# Patient Record
Sex: Male | Born: 1960 | Race: Black or African American | Hispanic: No | Marital: Single | Smoking: Current every day smoker
Health system: Southern US, Community
[De-identification: ages and names within clinical notes are randomized; demographics above are authoritative.]

## PROBLEM LIST (undated history)

## (undated) DIAGNOSIS — M453 Ankylosing spondylitis of cervicothoracic region: Secondary | ICD-10-CM

## (undated) DIAGNOSIS — E119 Type 2 diabetes mellitus without complications: Secondary | ICD-10-CM

## (undated) DIAGNOSIS — M2550 Pain in unspecified joint: Secondary | ICD-10-CM

## (undated) DIAGNOSIS — E43 Unspecified severe protein-calorie malnutrition: Secondary | ICD-10-CM

## (undated) DIAGNOSIS — K8020 Calculus of gallbladder without cholecystitis without obstruction: Secondary | ICD-10-CM

## (undated) DIAGNOSIS — M4802 Spinal stenosis, cervical region: Secondary | ICD-10-CM

## (undated) DIAGNOSIS — M109 Gout, unspecified: Secondary | ICD-10-CM

## (undated) DIAGNOSIS — R634 Abnormal weight loss: Secondary | ICD-10-CM

## (undated) DIAGNOSIS — M1990 Unspecified osteoarthritis, unspecified site: Secondary | ICD-10-CM

## (undated) DIAGNOSIS — J189 Pneumonia, unspecified organism: Secondary | ICD-10-CM

## (undated) DIAGNOSIS — E1110 Type 2 diabetes mellitus with ketoacidosis without coma: Secondary | ICD-10-CM

## (undated) DIAGNOSIS — K4090 Unilateral inguinal hernia, without obstruction or gangrene, not specified as recurrent: Secondary | ICD-10-CM

## (undated) HISTORY — PX: NO PAST SURGERIES: SHX2092

---

## 2003-05-24 ENCOUNTER — Emergency Department (HOSPITAL_COMMUNITY): Admission: EM | Admit: 2003-05-24 | Discharge: 2003-05-24 | Payer: Self-pay | Admitting: Emergency Medicine

## 2003-05-27 ENCOUNTER — Emergency Department (HOSPITAL_COMMUNITY): Admission: EM | Admit: 2003-05-27 | Discharge: 2003-05-27 | Payer: Self-pay | Admitting: Emergency Medicine

## 2006-01-24 ENCOUNTER — Emergency Department (HOSPITAL_COMMUNITY): Admission: EM | Admit: 2006-01-24 | Discharge: 2006-01-24 | Payer: Self-pay | Admitting: Emergency Medicine

## 2010-12-16 ENCOUNTER — Emergency Department (HOSPITAL_COMMUNITY): Payer: No Typology Code available for payment source

## 2010-12-16 ENCOUNTER — Emergency Department (HOSPITAL_COMMUNITY)
Admission: EM | Admit: 2010-12-16 | Discharge: 2010-12-17 | Disposition: A | Discharge status: Home / Self Care | Payer: No Typology Code available for payment source | Attending: Emergency Medicine | Admitting: Emergency Medicine

## 2010-12-16 ENCOUNTER — Encounter: Payer: Self-pay | Admitting: *Deleted

## 2010-12-16 DIAGNOSIS — M25532 Pain in left wrist: Secondary | ICD-10-CM

## 2010-12-16 DIAGNOSIS — Y92410 Unspecified street and highway as the place of occurrence of the external cause: Secondary | ICD-10-CM | POA: Insufficient documentation

## 2010-12-16 DIAGNOSIS — S20219A Contusion of unspecified front wall of thorax, initial encounter: Secondary | ICD-10-CM | POA: Insufficient documentation

## 2010-12-16 DIAGNOSIS — M25539 Pain in unspecified wrist: Secondary | ICD-10-CM | POA: Insufficient documentation

## 2010-12-16 DIAGNOSIS — F17200 Nicotine dependence, unspecified, uncomplicated: Secondary | ICD-10-CM | POA: Insufficient documentation
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
## 2010-12-17 MED ORDER — HYDROCODONE-ACETAMINOPHEN 5-325 MG PO TABS: ORAL_TABLET | ORAL | Status: AC
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
## 2010-12-22 ENCOUNTER — Encounter: Payer: Self-pay | Admitting: Orthopedic Surgery

## 2010-12-22 ENCOUNTER — Ambulatory Visit (INDEPENDENT_AMBULATORY_CARE_PROVIDER_SITE_OTHER): Payer: BC Managed Care – PPO | Admitting: Orthopedic Surgery

## 2010-12-22 VITALS — Resp 16 | Ht 63.0 in | Wt 334.0 lb

## 2010-12-22 DIAGNOSIS — S52609A Unspecified fracture of lower end of unspecified ulna, initial encounter for closed fracture: Secondary | ICD-10-CM

## 2010-12-22 NOTE — Patient Instructions (Signed)
OOW  8-15  Thru 9-18  Wear brace except for bathing
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
## 2011-01-19 ENCOUNTER — Ambulatory Visit: Payer: BC Managed Care – PPO | Admitting: Orthopedic Surgery

## 2011-02-03 ENCOUNTER — Ambulatory Visit (INDEPENDENT_AMBULATORY_CARE_PROVIDER_SITE_OTHER): Payer: BC Managed Care – PPO | Admitting: Orthopedic Surgery

## 2011-02-03 ENCOUNTER — Encounter: Payer: Self-pay | Admitting: Orthopedic Surgery

## 2011-02-03 DIAGNOSIS — S52609A Unspecified fracture of lower end of unspecified ulna, initial encounter for closed fracture: Secondary | ICD-10-CM
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
## 2011-02-03 NOTE — Patient Instructions (Signed)
Released to normal activity:)

## 2011-09-22 ENCOUNTER — Encounter (HOSPITAL_COMMUNITY): Payer: Self-pay | Admitting: *Deleted

## 2011-09-22 ENCOUNTER — Emergency Department (HOSPITAL_COMMUNITY)
Admission: EM | Admit: 2011-09-22 | Discharge: 2011-09-22 | Disposition: A | Discharge status: Home / Self Care | Payer: BC Managed Care – PPO | Attending: Emergency Medicine | Admitting: Emergency Medicine

## 2011-09-22 DIAGNOSIS — IMO0002 Reserved for concepts with insufficient information to code with codable children: Secondary | ICD-10-CM | POA: Insufficient documentation

## 2011-09-22 DIAGNOSIS — X58XXXA Exposure to other specified factors, initial encounter: Secondary | ICD-10-CM | POA: Insufficient documentation

## 2011-09-22 DIAGNOSIS — T148XXA Other injury of unspecified body region, initial encounter: Secondary | ICD-10-CM

## 2011-09-22 DIAGNOSIS — R109 Unspecified abdominal pain: Secondary | ICD-10-CM | POA: Insufficient documentation

## 2011-09-22 MED ORDER — CYCLOBENZAPRINE HCL 10 MG PO TABS: ORAL_TABLET | ORAL | Status: DC

## 2011-09-22 MED ORDER — HYDROCODONE-ACETAMINOPHEN 5-325 MG PO TABS: ORAL_TABLET | ORAL | Status: DC

## 2011-09-22 NOTE — ED Notes (Signed)
Pt c/o right upper thigh/groin pain that started Monday, worse with movement,  Unsure of any injury

## 2011-09-22 NOTE — ED Provider Notes (Signed)
History     CSN: [id number]  Arrival date & time 09/22/11  1433   First MD Initiated Contact with Patient 09/22/11 1449      Chief Complaint  Patient presents with  . Groin Pain    (Consider location/radiation/quality/duration/timing/severity/associated sxs/prior treatment) Patient is a 51 y.o. male presenting with groin pain. The history is provided by the patient. No language interpreter was used.  Groin Pain This is a new problem. Episode onset: pt was sitting down at work 2 days ago.  when he stood up he had a "sharp" pain in the R groin area.  the only time it hurts is when he flexes his R leg..  no abdominal pain.  appetite normal.  no back pain or testicular pain.  no fever or chills. The problem has been unchanged. Pertinent negatives include no change in bowel habit, chest pain, diaphoresis, nausea or vomiting. The symptoms are aggravated by walking and standing. He has tried NSAIDs (took 2 ibuprofen yest with no relief.) for the symptoms.    History reviewed. No pertinent past medical history.  Past Surgical History  Procedure Date  . No past surgeries     Family History  Problem Relation Age of Onset  . Arthritis    . Diabetes      History  Substance Use Topics  . Smoking status: Current Everyday Smoker -- 1.0 packs/day  . Smokeless tobacco: Not on file  . Alcohol Use: 0.6 oz/week    1 Cans of beer per week     occassional drinker      Review of Systems  Constitutional: Negative for diaphoresis.  Cardiovascular: Negative for chest pain.  Gastrointestinal: Negative for nausea, vomiting, diarrhea and change in bowel habit.  Genitourinary: Negative for dysuria, urgency, frequency, hematuria, decreased urine volume, discharge, scrotal swelling, genital sores and testicular pain.  All other systems reviewed and are negative.    Allergies  Bee venom and Shellfish allergy  Home Medications   Current Outpatient Rx  Name Route Sig Dispense Refill  .  DIPHENHYDRAMINE HCL 25 MG PO TABS Oral Take 25-50 mg by mouth 2 (two) times daily. For itching and allergy symptoms     . IBUPROFEN 200 MG PO TABS Oral Take 400 mg by mouth as needed. For pain    . NASAL SPRAY NA Nasal Place 2-3 sprays into the nose every 8 (eight) hours as needed. For allergy symptoms     . CYCLOBENZAPRINE HCL 10 MG PO TABS  1/2 to one tab po TID 20 tablet 0  . HYDROCODONE-ACETAMINOPHEN 5-325 MG PO TABS  One tab po q 4-6 hrs prn pain 20 tablet 0    BP 137/70  Pulse 98  Temp 98.2 F (36.8 C)  Resp 18  Ht 5\' 6"  (1.676 m)  Wt 320 lb (145.151 kg)  BMI 51.65 kg/m2  SpO2 95%  Physical Exam  Nursing note and vitals reviewed. Constitutional: He is oriented to person, place, and time. He appears well-developed and well-nourished.  Non-toxic appearance. He does not have a sickly appearance. He does not appear ill. No distress.       Morbidly obese.  HENT:  Head: Normocephalic and atraumatic.  Eyes: EOM are normal.  Neck: Normal range of motion.  Cardiovascular: Normal rate, regular rhythm, normal heart sounds and intact distal pulses.   Pulmonary/Chest: Effort normal and breath sounds normal. No respiratory distress.  Abdominal: Soft. He exhibits no distension. There is no tenderness.  Musculoskeletal: Normal range of motion.  Legs: Neurological: He is alert and oriented to person, place, and time.  Skin: Skin is warm and dry.  Psychiatric: He has a normal mood and affect. Judgment normal.    ED Course  Procedures (including critical care time)  Labs Reviewed - No data to display No results found.   1. Muscle strain       MDM  Exam c/w muscle strain/tear.  Pt agrees with muscle relaxant, NSAID and watchful waiting.        Worthy Rancher, PA 09/22/11 [phone]

## 2011-09-22 NOTE — Discharge Instructions (Signed)
Muscle Strain A muscle strain (pulled muscle) happens when a muscle is over-stretched. Recovery usually takes 5 to 6 weeks.  HOME CARE   Put ice on the injured area.   Put ice in a plastic bag.   Place a towel between your skin and the bag.   Leave the ice on for 15 to 20 minutes at a time, every hour for the first 2 days.   Do not use the muscle for several days or until your doctor says you can. Do not use the muscle if you have pain.   Wrap the injured area with an elastic bandage for comfort. Do not put it on too tightly.   Only take medicine as told by your doctor.   Warm up before exercise. This helps prevent muscle strains.  GET HELP RIGHT AWAY IF:  There is increased pain or puffiness (swelling) in the affected area. MAKE SURE YOU:   Understand these instructions.   Will watch your condition.   Will get help right away if you are not doing well or get worse.  Document Released: 01/27/2008 Document Revised: 04/08/2011 Document Reviewed: 01/27/2008 [facility] Patient Information 2012 [place], [place].   Take the meds as directed.  Take ibuprofen 800 mg every 8 hrs with food.  Return if your symptoms change significantly.

## 2011-09-23 NOTE — ED Provider Notes (Signed)
Medical screening examination/treatment/procedure(s) were performed by non-physician practitioner and as supervising physician I was immediately available for consultation/collaboration.  Donnetta Hutching, MD 09/23/11 [phone]

## 2011-11-30 ENCOUNTER — Encounter (HOSPITAL_COMMUNITY): Payer: Self-pay | Admitting: Emergency Medicine

## 2011-11-30 ENCOUNTER — Emergency Department (HOSPITAL_COMMUNITY): Payer: BC Managed Care – PPO

## 2011-11-30 ENCOUNTER — Emergency Department (HOSPITAL_COMMUNITY)
Admission: EM | Admit: 2011-11-30 | Discharge: 2011-11-30 | Disposition: A | Discharge status: Home / Self Care | Payer: BC Managed Care – PPO | Attending: Emergency Medicine | Admitting: Emergency Medicine

## 2011-11-30 DIAGNOSIS — R0602 Shortness of breath: Secondary | ICD-10-CM | POA: Insufficient documentation

## 2011-11-30 DIAGNOSIS — J40 Bronchitis, not specified as acute or chronic: Secondary | ICD-10-CM | POA: Insufficient documentation

## 2011-11-30 DIAGNOSIS — F17200 Nicotine dependence, unspecified, uncomplicated: Secondary | ICD-10-CM | POA: Insufficient documentation

## 2011-11-30 DIAGNOSIS — J9801 Acute bronchospasm: Secondary | ICD-10-CM

## 2011-11-30 LAB — COMPREHENSIVE METABOLIC PANEL
Alkaline Phosphatase: 117 U/L (ref 39–117)
BUN: 8 mg/dL (ref 6–23)
Creatinine, Ser: 0.69 mg/dL (ref 0.50–1.35)
GFR calc Af Amer: >90 mL/min (ref >90)
Glucose, Bld: 147 mg/dL — ABNORMAL HIGH (ref 70–99)
Potassium: 4.1 mEq/L (ref 3.5–5.1)
Total Bilirubin: 0.3 mg/dL (ref 0.3–1.2)
Total Protein: 7.8 g/dL (ref 6.0–8.3)

## 2011-11-30 LAB — CBC WITH DIFFERENTIAL/PLATELET
Eosinophils Absolute: 0.2 10*3/uL (ref 0.0–0.7)
HCT: 46.5 % (ref 39.0–52.0)
Hemoglobin: 14.8 g/dL (ref 13.0–17.0)
Lymphs Abs: 1.2 10*3/uL (ref 0.7–4.0)
MCH: 28.1 pg (ref 26.0–34.0)
MCHC: 31.8 g/dL (ref 30.0–36.0)
MCV: 88.2 fL (ref 78.0–100.0)
Monocytes Absolute: 0.6 10*3/uL (ref 0.1–1.0)
Monocytes Relative: 6 % (ref 3–12)
Neutrophils Relative %: 81 % — ABNORMAL HIGH (ref 43–77)
RBC: 5.27 MIL/uL (ref 4.22–5.81)

## 2011-11-30 MED ORDER — IPRATROPIUM BROMIDE 0.02 % IN SOLN
0.5000 mg | Freq: Once | RESPIRATORY_TRACT | Status: AC
  Administered 2011-11-30: 0.5 mg via RESPIRATORY_TRACT
  Filled 2011-11-30: qty 2.5

## 2011-11-30 MED ORDER — ALBUTEROL SULFATE HFA 108 (90 BASE) MCG/ACT IN AERS: 1.0000 | INHALATION_SPRAY | Freq: Four times a day (QID) | RESPIRATORY_TRACT | Status: DC | PRN

## 2011-11-30 MED ORDER — PREDNISONE 10 MG PO TABS: 20.0000 mg | ORAL_TABLET | Freq: Every day | ORAL | Status: DC

## 2011-11-30 MED ORDER — METHYLPREDNISOLONE SODIUM SUCC 125 MG IJ SOLR
125.0000 mg | Freq: Once | INTRAMUSCULAR | Status: AC
  Administered 2011-11-30: 125 mg via INTRAVENOUS
  Filled 2011-11-30: qty 2

## 2011-11-30 MED ORDER — AMOXICILLIN 500 MG PO CAPS: 500.0000 mg | ORAL_CAPSULE | Freq: Three times a day (TID) | ORAL | Status: AC

## 2011-11-30 MED ORDER — ALBUTEROL SULFATE (5 MG/ML) 0.5% IN NEBU
2.5000 mg | INHALATION_SOLUTION | Freq: Once | RESPIRATORY_TRACT | Status: AC
  Administered 2011-11-30: 2.5 mg via RESPIRATORY_TRACT
  Filled 2011-11-30: qty 0.5

## 2011-11-30 NOTE — ED Notes (Signed)
Patient states he feels he is breathing better now.  Sats at 97%

## 2011-11-30 NOTE — ED Notes (Signed)
Placed pt. On 3L [place] O2 for SpO2 of 88%.  SpO2 rebounded to 94% afterward.

## 2011-11-30 NOTE — ED Provider Notes (Signed)
History     CSN: [id number]  Arrival date & time 11/30/11  2006   First MD Initiated Contact with Patient 11/30/11 2018      Chief Complaint  Patient presents with  . Cough  . Nasal Congestion  . Shortness of Breath    (Consider location/radiation/quality/duration/timing/severity/associated sxs/prior treatment) Patient is a 51 y.o. male presenting with cough and shortness of breath. The history is provided by the patient (the pt complains of a cough and sob). No language interpreter was used.  Cough This is a new problem. The current episode started yesterday. The problem occurs constantly. The problem has not changed since onset.The cough is productive of sputum. There has been no fever. Associated symptoms include shortness of breath. Pertinent negatives include no chest pain and no headaches. He has tried nothing for the symptoms. The treatment provided no relief. He is not a smoker. His past medical history does not include bronchitis or pneumonia.  Shortness of Breath  Associated symptoms include cough and shortness of breath. Pertinent negatives include no chest pain.    History reviewed. No pertinent past medical history.  Past Surgical History  Procedure Date  . No past surgeries     Family History  Problem Relation Age of Onset  . Arthritis    . Diabetes      History  Substance Use Topics  . Smoking status: Current Everyday Smoker -- 1.0 packs/day  . Smokeless tobacco: Not on file  . Alcohol Use: 0.6 oz/week    1 Cans of beer per week     occassional drinker      Review of Systems  Constitutional: Negative for fatigue.  HENT: Negative for congestion, sinus pressure and ear discharge.   Eyes: Negative for discharge.  Respiratory: Positive for cough and shortness of breath.   Cardiovascular: Negative for chest pain.  Gastrointestinal: Negative for abdominal pain and diarrhea.  Genitourinary: Negative for frequency and hematuria.  Musculoskeletal:  Negative for back pain.  Skin: Negative for rash.  Neurological: Negative for seizures and headaches.  Hematological: Negative.   Psychiatric/Behavioral: Negative for hallucinations.    Allergies  Bee venom and Shellfish allergy  Home Medications   Current Outpatient Rx  Name Route Sig Dispense Refill  . DIPHENHYDRAMINE HCL 25 MG PO TABS Oral Take 25-50 mg by mouth daily as needed. For itching and allergy symptoms    . IBUPROFEN 200 MG PO TABS Oral Take 400 mg by mouth as needed. For pain    . NASAL SPRAY NA Nasal Place 2-3 sprays into the nose every 8 (eight) hours as needed. For allergy symptoms     . ALBUTEROL SULFATE HFA 108 (90 BASE) MCG/ACT IN AERS Inhalation Inhale 1-2 puffs into the lungs every 6 (six) hours as needed for wheezing. 1 Inhaler 0  . AMOXICILLIN 500 MG PO CAPS Oral Take 1 capsule (500 mg total) by mouth 3 (three) times daily. 21 capsule 0  . PREDNISONE 10 MG PO TABS Oral Take 2 tablets (20 mg total) by mouth daily. 15 tablet 0    BP 177/84  Pulse 104  Temp 98.4 F (36.9 C) (Oral)  Resp 20  Ht 5\' 6"  (1.676 m)  Wt 340 lb (154.223 kg)  BMI 54.88 kg/m2  SpO2 98%  Physical Exam  Constitutional: He is oriented to person, place, and time. He appears well-developed.  HENT:  Head: Normocephalic and atraumatic.  Eyes: Conjunctivae and EOM are normal. No scleral icterus.  Neck: Neck supple. No thyromegaly present.  Cardiovascular: Normal rate and regular rhythm.  Exam reveals no gallop and no friction rub.   No murmur heard. Pulmonary/Chest: No stridor. He has wheezes. He has no rales. He exhibits no tenderness.  Abdominal: He exhibits no distension. There is no tenderness. There is no rebound.  Musculoskeletal: Normal range of motion. He exhibits no edema.  Lymphadenopathy:    He has no cervical adenopathy.  Neurological: He is oriented to person, place, and time. Coordination normal.  Skin: No rash noted. No erythema.  Psychiatric: He has a normal mood and  affect. His behavior is normal.    ED Course  Procedures (including critical care time)  Labs Reviewed  CBC WITH DIFFERENTIAL - Abnormal; Notable for the following:    RDW 16.4 (*)     Neutrophils Relative 81 (*)     Neutro Abs 8.4 (*)     Lymphocytes Relative 11 (*)     All other components within normal limits  COMPREHENSIVE METABOLIC PANEL - Abnormal; Notable for the following:    Glucose, Bld 147 (*)     All other components within normal limits   Dg Chest Port 1 View  11/30/2011  *RADIOLOGY REPORT*  Clinical Data: Cough and congestion  PORTABLE CHEST - 1 VIEW  Comparison: 12/16/2010  Findings: The heart and pulmonary vascularity are stable.  The lungs are clear bilaterally.  No acute bony abnormality is seen.  IMPRESSION: No acute abnormality is noted.  Original Report Authenticated By: Phillips Odor, M.D.     1. Bronchitis   2. Bronchospasm       MDM          Benny Lennert, MD 11/30/11 2142

## 2011-11-30 NOTE — ED Notes (Signed)
Patient complaining of cough, congestion, and shortness of breath starting last night.

## 2011-11-30 NOTE — ED Notes (Signed)
Patient with no complaints at this time. Respirations even and unlabored. Skin warm/dry. Discharge instructions reviewed with patient at this time. Patient given opportunity to voice concerns/ask questions. IV removed per policy and band-aid applied to site. Patient discharged at this time and left Emergency Department with steady gait.  

## 2012-10-15 ENCOUNTER — Inpatient Hospital Stay (HOSPITAL_COMMUNITY)
Admission: EM | Admit: 2012-10-15 | Discharge: 2012-10-21 | DRG: 566 | Disposition: A | Discharge status: Home Health | Payer: BC Managed Care – PPO | Attending: Internal Medicine | Admitting: Internal Medicine

## 2012-10-15 ENCOUNTER — Emergency Department (HOSPITAL_COMMUNITY): Payer: BC Managed Care – PPO

## 2012-10-15 ENCOUNTER — Encounter (HOSPITAL_COMMUNITY): Payer: Self-pay | Admitting: Family Medicine

## 2012-10-15 DIAGNOSIS — M109 Gout, unspecified: Secondary | ICD-10-CM | POA: Diagnosis present

## 2012-10-15 DIAGNOSIS — E1169 Type 2 diabetes mellitus with other specified complication: Secondary | ICD-10-CM | POA: Insufficient documentation

## 2012-10-15 DIAGNOSIS — I498 Other specified cardiac arrhythmias: Secondary | ICD-10-CM | POA: Diagnosis present

## 2012-10-15 DIAGNOSIS — L03115 Cellulitis of right lower limb: Secondary | ICD-10-CM | POA: Diagnosis present

## 2012-10-15 DIAGNOSIS — Z833 Family history of diabetes mellitus: Secondary | ICD-10-CM

## 2012-10-15 DIAGNOSIS — R000 Tachycardia, unspecified: Secondary | ICD-10-CM | POA: Diagnosis present

## 2012-10-15 DIAGNOSIS — R634 Abnormal weight loss: Secondary | ICD-10-CM | POA: Diagnosis present

## 2012-10-15 DIAGNOSIS — E1110 Type 2 diabetes mellitus with ketoacidosis without coma: Secondary | ICD-10-CM | POA: Insufficient documentation

## 2012-10-15 DIAGNOSIS — M453 Ankylosing spondylitis of cervicothoracic region: Secondary | ICD-10-CM | POA: Diagnosis present

## 2012-10-15 DIAGNOSIS — M539 Dorsopathy, unspecified: Secondary | ICD-10-CM | POA: Diagnosis present

## 2012-10-15 DIAGNOSIS — R600 Localized edema: Secondary | ICD-10-CM | POA: Diagnosis present

## 2012-10-15 DIAGNOSIS — E876 Hypokalemia: Secondary | ICD-10-CM | POA: Diagnosis present

## 2012-10-15 DIAGNOSIS — M064 Inflammatory polyarthropathy: Secondary | ICD-10-CM | POA: Diagnosis present

## 2012-10-15 DIAGNOSIS — K8020 Calculus of gallbladder without cholecystitis without obstruction: Secondary | ICD-10-CM | POA: Diagnosis present

## 2012-10-15 DIAGNOSIS — M436 Torticollis: Secondary | ICD-10-CM | POA: Diagnosis present

## 2012-10-15 DIAGNOSIS — M25819 Other specified joint disorders, unspecified shoulder: Secondary | ICD-10-CM | POA: Diagnosis present

## 2012-10-15 DIAGNOSIS — E669 Obesity, unspecified: Secondary | ICD-10-CM | POA: Diagnosis present

## 2012-10-15 DIAGNOSIS — E1165 Type 2 diabetes mellitus with hyperglycemia: Secondary | ICD-10-CM | POA: Diagnosis present

## 2012-10-15 DIAGNOSIS — E1310 Other specified diabetes mellitus with ketoacidosis without coma: Principal | ICD-10-CM | POA: Diagnosis present

## 2012-10-15 DIAGNOSIS — M25579 Pain in unspecified ankle and joints of unspecified foot: Secondary | ICD-10-CM | POA: Diagnosis present

## 2012-10-15 DIAGNOSIS — L02419 Cutaneous abscess of limb, unspecified: Secondary | ICD-10-CM | POA: Diagnosis present

## 2012-10-15 DIAGNOSIS — L03116 Cellulitis of left lower limb: Secondary | ICD-10-CM | POA: Diagnosis present

## 2012-10-15 DIAGNOSIS — K4090 Unilateral inguinal hernia, without obstruction or gangrene, not specified as recurrent: Secondary | ICD-10-CM | POA: Diagnosis present

## 2012-10-15 DIAGNOSIS — IMO0002 Reserved for concepts with insufficient information to code with codable children: Secondary | ICD-10-CM | POA: Diagnosis present

## 2012-10-15 DIAGNOSIS — R269 Unspecified abnormalities of gait and mobility: Secondary | ICD-10-CM | POA: Diagnosis present

## 2012-10-15 DIAGNOSIS — M4802 Spinal stenosis, cervical region: Secondary | ICD-10-CM | POA: Diagnosis present

## 2012-10-15 DIAGNOSIS — M7540 Impingement syndrome of unspecified shoulder: Secondary | ICD-10-CM | POA: Diagnosis present

## 2012-10-15 DIAGNOSIS — M79604 Pain in right leg: Secondary | ICD-10-CM

## 2012-10-15 DIAGNOSIS — F17200 Nicotine dependence, unspecified, uncomplicated: Secondary | ICD-10-CM | POA: Diagnosis present

## 2012-10-15 DIAGNOSIS — M25519 Pain in unspecified shoulder: Secondary | ICD-10-CM | POA: Diagnosis present

## 2012-10-15 DIAGNOSIS — E119 Type 2 diabetes mellitus without complications: Secondary | ICD-10-CM

## 2012-10-15 DIAGNOSIS — Z6839 Body mass index (BMI) 39.0-39.9, adult: Secondary | ICD-10-CM

## 2012-10-15 DIAGNOSIS — R29898 Other symptoms and signs involving the musculoskeletal system: Secondary | ICD-10-CM | POA: Diagnosis present

## 2012-10-15 DIAGNOSIS — E43 Unspecified severe protein-calorie malnutrition: Secondary | ICD-10-CM | POA: Diagnosis present

## 2012-10-15 DIAGNOSIS — Z91013 Allergy to seafood: Secondary | ICD-10-CM

## 2012-10-15 DIAGNOSIS — Z23 Encounter for immunization: Secondary | ICD-10-CM

## 2012-10-15 DIAGNOSIS — M542 Cervicalgia: Secondary | ICD-10-CM | POA: Diagnosis present

## 2012-10-15 DIAGNOSIS — M1990 Unspecified osteoarthritis, unspecified site: Secondary | ICD-10-CM | POA: Diagnosis present

## 2012-10-15 DIAGNOSIS — E871 Hypo-osmolality and hyponatremia: Secondary | ICD-10-CM | POA: Diagnosis present

## 2012-10-15 HISTORY — DX: Type 2 diabetes mellitus with ketoacidosis without coma: E11.10

## 2012-10-15 HISTORY — DX: Spinal stenosis, cervical region: M48.02

## 2012-10-15 HISTORY — DX: Abnormal weight loss: R63.4

## 2012-10-15 HISTORY — DX: Unilateral inguinal hernia, without obstruction or gangrene, not specified as recurrent: K40.90

## 2012-10-15 HISTORY — DX: Unspecified osteoarthritis, unspecified site: M19.90

## 2012-10-15 HISTORY — DX: Gout, unspecified: M10.9

## 2012-10-15 HISTORY — DX: Ankylosing spondylitis of cervicothoracic region: M45.3

## 2012-10-15 HISTORY — DX: Pain in unspecified joint: M25.50

## 2012-10-15 HISTORY — DX: Type 2 diabetes mellitus without complications: E11.9

## 2012-10-15 HISTORY — DX: Unspecified severe protein-calorie malnutrition: E43

## 2012-10-15 HISTORY — DX: Calculus of gallbladder without cholecystitis without obstruction: K80.20

## 2012-10-15 LAB — BASIC METABOLIC PANEL
BUN: 6 mg/dL (ref 6–23)
CO2: 17 mEq/L — ABNORMAL LOW (ref 19–32)
Calcium: 10 mg/dL (ref 8.4–10.5)
Calcium: 9.3 mg/dL (ref 8.4–10.5)
Chloride: 91 mEq/L — ABNORMAL LOW (ref 96–112)
Creatinine, Ser: 0.49 mg/dL — ABNORMAL LOW (ref 0.50–1.35)
GFR calc Af Amer: >90 mL/min (ref >90)
GFR calc Af Amer: >90 mL/min (ref >90)
GFR calc Af Amer: >90 mL/min (ref >90)
GFR calc Af Amer: >90 mL/min (ref >90)
GFR calc non Af Amer: >90 mL/min (ref >90)
GFR calc non Af Amer: >90 mL/min (ref >90)
Glucose, Bld: 327 mg/dL — ABNORMAL HIGH (ref 70–99)
Glucose, Bld: 187 mg/dL — ABNORMAL HIGH (ref 70–99)
Potassium: 4.1 mEq/L (ref 3.5–5.1)
Potassium: 3.9 mEq/L (ref 3.5–5.1)
Potassium: 3.2 mEq/L — ABNORMAL LOW (ref 3.5–5.1)
Sodium: 131 mEq/L — ABNORMAL LOW (ref 135–145)
Sodium: 133 mEq/L — ABNORMAL LOW (ref 135–145)
Sodium: 135 mEq/L (ref 135–145)

## 2012-10-15 LAB — GLUCOSE, CAPILLARY
Glucose-Capillary: 324 mg/dL — ABNORMAL HIGH (ref 70–99)
Glucose-Capillary: 285 mg/dL — ABNORMAL HIGH (ref 70–99)
Glucose-Capillary: 299 mg/dL — ABNORMAL HIGH (ref 70–99)
Glucose-Capillary: 263 mg/dL — ABNORMAL HIGH (ref 70–99)
Glucose-Capillary: 196 mg/dL — ABNORMAL HIGH (ref 70–99)
Glucose-Capillary: 180 mg/dL — ABNORMAL HIGH (ref 70–99)
Glucose-Capillary: 166 mg/dL — ABNORMAL HIGH (ref 70–99)

## 2012-10-15 LAB — CBC WITH DIFFERENTIAL/PLATELET
Basophils Absolute: 0 10*3/uL (ref 0.0–0.1)
Eosinophils Absolute: 0 10*3/uL (ref 0.0–0.7)
Lymphocytes Relative: 7 % — ABNORMAL LOW (ref 12–46)
MCH: 29.3 pg (ref 26.0–34.0)
MCHC: 34.9 g/dL (ref 30.0–36.0)
Monocytes Absolute: 1.3 10*3/uL — ABNORMAL HIGH (ref 0.1–1.0)
Neutrophils Relative %: 85 % — ABNORMAL HIGH (ref 43–77)
Platelets: 188 10*3/uL (ref 150–400)

## 2012-10-15 LAB — URINALYSIS, ROUTINE W REFLEX MICROSCOPIC
Glucose, UA: 500 mg/dL — AB
Ketones, ur: >80 mg/dL — AB
Leukocytes, UA: NEGATIVE
Protein, ur: 30 mg/dL — AB
pH: 6 (ref 5.0–8.0)

## 2012-10-15 LAB — BLOOD GAS, ARTERIAL
Bicarbonate: 11.6 mEq/L — ABNORMAL LOW (ref 20.0–24.0)
O2 Content: 89.3 L/min
Patient temperature: 37
pCO2 arterial: 22.4 mmHg — ABNORMAL LOW (ref 35.0–45.0)
pH, Arterial: 7.336 — ABNORMAL LOW (ref 7.350–7.450)
pO2, Arterial: 89.3 mmHg (ref 80.0–100.0)

## 2012-10-15 LAB — URINE MICROSCOPIC-ADD ON

## 2012-10-15 LAB — TROPONIN I: Troponin I: <0.3 ng/mL (ref <0.30)

## 2012-10-15 LAB — D-DIMER, QUANTITATIVE: D-Dimer, Quant: 1.19 ug/mL-FEU — ABNORMAL HIGH (ref 0.00–0.48)

## 2012-10-15 LAB — MRSA PCR SCREENING: MRSA by PCR: POSITIVE — AB

## 2012-10-15 MED ORDER — ONDANSETRON HCL 4 MG/2ML IJ SOLN
4.0000 mg | INTRAMUSCULAR | Status: DC | PRN
  Administered 2012-10-15 – 2012-10-19 (×2): 4 mg via INTRAVENOUS
  Filled 2012-10-15 (×2): qty 2

## 2012-10-15 MED ORDER — DEXTROSE-NACL 5-0.45 % IV SOLN: INTRAVENOUS | Status: DC

## 2012-10-15 MED ORDER — SODIUM CHLORIDE 0.9 % IV SOLN
INTRAVENOUS | Status: AC
  Administered 2012-10-15: 19:00:00 via INTRAVENOUS

## 2012-10-15 MED ORDER — PNEUMOCOCCAL VAC POLYVALENT 25 MCG/0.5ML IJ INJ
0.5000 mL | INJECTION | INTRAMUSCULAR | Status: AC
  Administered 2012-10-16: 0.5 mL via INTRAMUSCULAR
  Filled 2012-10-15: qty 0.5

## 2012-10-15 MED ORDER — SODIUM CHLORIDE 0.9 % IV BOLUS (SEPSIS)
1000.0000 mL | Freq: Once | INTRAVENOUS | Status: AC
  Administered 2012-10-15: 1000 mL via INTRAVENOUS

## 2012-10-15 MED ORDER — MORPHINE SULFATE 4 MG/ML IJ SOLN
4.0000 mg | INTRAMUSCULAR | Status: DC | PRN
  Administered 2012-10-15: 4 mg via INTRAVENOUS
  Filled 2012-10-15: qty 1

## 2012-10-15 MED ORDER — INSULIN REGULAR BOLUS VIA INFUSION
0.0000 [IU] | Freq: Three times a day (TID) | INTRAVENOUS | Status: DC
  Filled 2012-10-15: qty 10

## 2012-10-15 MED ORDER — SODIUM CHLORIDE 0.9 % IV SOLN: INTRAVENOUS | Status: DC

## 2012-10-15 MED ORDER — CHLORHEXIDINE GLUCONATE CLOTH 2 % EX PADS
6.0000 | MEDICATED_PAD | Freq: Every day | CUTANEOUS | Status: AC
  Administered 2012-10-17 – 2012-10-20 (×4): 6 via TOPICAL

## 2012-10-15 MED ORDER — DEXTROSE-NACL 5-0.45 % IV SOLN
INTRAVENOUS | Status: DC
  Administered 2012-10-15 – 2012-10-16 (×3): via INTRAVENOUS

## 2012-10-15 MED ORDER — SODIUM CHLORIDE 0.9 % IV SOLN
INTRAVENOUS | Status: DC
  Filled 2012-10-15: qty 1

## 2012-10-15 MED ORDER — POTASSIUM CHLORIDE 10 MEQ/100ML IV SOLN
10.0000 meq | INTRAVENOUS | Status: AC
  Administered 2012-10-15 (×2): 10 meq via INTRAVENOUS
  Filled 2012-10-15: qty 200

## 2012-10-15 MED ORDER — DEXTROSE 50 % IV SOLN: 25.0000 mL | INTRAVENOUS | Status: DC | PRN

## 2012-10-15 MED ORDER — INSULIN REGULAR HUMAN 100 UNIT/ML IJ SOLN
INTRAMUSCULAR | Status: AC
  Filled 2012-10-15: qty 3

## 2012-10-15 MED ORDER — MUPIROCIN 2 % EX OINT
1.0000 "application " | TOPICAL_OINTMENT | Freq: Two times a day (BID) | CUTANEOUS | Status: AC
  Administered 2012-10-16 – 2012-10-20 (×10): 1 via NASAL
  Filled 2012-10-15 (×3): qty 22

## 2012-10-15 MED ORDER — IOHEXOL 350 MG/ML SOLN
100.0000 mL | Freq: Once | INTRAVENOUS | Status: AC | PRN
  Administered 2012-10-15: 100 mL via INTRAVENOUS

## 2012-10-15 MED ORDER — SODIUM CHLORIDE 0.9 % IV SOLN
INTRAVENOUS | Status: DC
  Administered 2012-10-15: 2.3 [IU]/h via INTRAVENOUS
  Filled 2012-10-15: qty 1

## 2012-10-15 MED ORDER — HYDROCODONE-ACETAMINOPHEN 5-325 MG PO TABS
1.0000 | ORAL_TABLET | ORAL | Status: DC | PRN
  Administered 2012-10-15 – 2012-10-21 (×13): 1 via ORAL
  Filled 2012-10-15 (×13): qty 1

## 2012-10-15 MED ORDER — ACETAMINOPHEN 325 MG PO TABS
650.0000 mg | ORAL_TABLET | Freq: Four times a day (QID) | ORAL | Status: DC | PRN
  Administered 2012-10-16: 650 mg via ORAL
  Filled 2012-10-15: qty 2

## 2012-10-15 NOTE — ED Provider Notes (Signed)
History     CSN: [id number]  Arrival date & time 10/15/12  1302   First MD Initiated Contact with Patient 10/15/12 1333      Chief Complaint  Patient presents with  . Shoulder Pain  . Ankle Pain  . Fatigue     HPI Pt was seen at 1345.  Per pt and family, c/o gradual onset and persistence of constant right ankle and bilat shoulders "pain" for the past 3 days. Pt states the pain began after he "slipped and fell," landing on his left side, the day previous. Pt states he was eval by an [facility] 2 days ago for same, had labs drawn. Pt states he was called and "told I had diabetes" and was started on metformin.  Pt states he now feels generally weak and fatigued. Denies specific complaints of CP/palpitations, no SOB/cough, no abd pain, no N/V/D, no back pain, no focal motor weakness, no tingling/numbness in extremities, no fevers.     Past Medical History  Diagnosis Date  . DM (diabetes mellitus)     Past Surgical History  Procedure Laterality Date  . No past surgeries      Family History  Problem Relation Age of Onset  . Arthritis    . Diabetes Brother   . Diabetes Brother     History  Substance Use Topics  . Smoking status: Current Every Day Smoker -- 1.00 packs/day  . Smokeless tobacco: Not on file  . Alcohol Use: 0.6 oz/week    1 Cans of beer per week     Comment: occassional drinker      Review of Systems ROS: Statement: All systems negative except as marked or noted in the HPI; Constitutional: Negative for fever and chills. +generalized weakness/fatigue.; ; Eyes: Negative for eye pain, redness and discharge. ; ; ENMT: Negative for ear pain, hoarseness, nasal congestion, sinus pressure and sore throat. ; ; Cardiovascular: Negative for chest pain, palpitations, diaphoresis, dyspnea and peripheral edema. ; ; Respiratory: Negative for cough, wheezing and stridor. ; ; Gastrointestinal: Negative for nausea, vomiting, diarrhea, abdominal pain, blood in stool, hematemesis,  jaundice and rectal bleeding. . ; ; Genitourinary: Negative for dysuria, flank pain and hematuria. ; ; Musculoskeletal: +right ankle pain, bilat shoulder pain. Negative for back pain and neck pain. Negative for swelling and trauma.; ; Skin: Negative for pruritus, rash, abrasions, blisters, bruising and skin lesion.; ; Neuro: Negative for headache, lightheadedness and neck stiffness. Negative for altered level of consciousness , altered mental status, extremity weakness, paresthesias, involuntary movement, seizure and syncope.       Allergies  Bee venom and Shellfish allergy  Home Medications   Current Outpatient Rx  Name  Route  Sig  Dispense  Refill  . diphenhydrAMINE (BENADRYL) 25 MG tablet   Oral   Take 25-50 mg by mouth daily as needed. For itching and allergy symptoms         . ibuprofen (ADVIL,MOTRIN) 200 MG tablet   Oral   Take 400 mg by mouth as needed. For pain         . metFORMIN (GLUCOPHAGE) 500 MG tablet   Oral   Take 500 mg by mouth 2 (two) times daily with a meal.         . Naproxen Sodium (ALEVE PO)   Oral   Take 1 tablet by mouth daily as needed (knee pain).         . Oxymetazoline HCl (NASAL SPRAY NA)   Nasal   Place 2-3  sprays into the nose every 8 (eight) hours as needed. For allergy symptoms            BP 118/67  Pulse 118  Temp(Src) 98.1 F (36.7 C)  Resp 20  Ht 5\' 6"  (1.676 m)  Wt 246 lb (111.585 kg)  BMI 39.72 kg/m2  SpO2 99%  Physical Exam 1350: Physical examination:  Nursing notes reviewed; Vital signs and O2 SAT reviewed;  Constitutional: Well developed, Well nourished, Well hydrated, In no acute distress; Head:  Normocephalic, atraumatic; Eyes: EOMI, PERRL, No scleral icterus; ENMT: Mouth and pharynx normal, Mucous membranes moist; Neck: Supple, Full range of motion, No lymphadenopathy; Cardiovascular: Regular rate and rhythm, No gallop; Respiratory: Breath sounds clear & equal bilaterally, No rales, rhonchi, wheezes.  Speaking full  sentences with ease, Normal respiratory effort/excursion; Chest: Nontender, Movement normal; Abdomen: Soft, Nontender, Nondistended, Normal bowel sounds; Genitourinary: No CVA tenderness; Spine:  No midline CS, TS, LS tenderness. +TTP bilat hypertonic trapezius muscles.;; Extremities: Pulses normal, +bilat shoulders w/FROM. +TTP bilat AC joint areas. Bilat clavicles NT, scapulas NT, proximal humerus NT, biceps tendon NT over bicipital groove.  Motor strength at shoulders normal.  Sensation intact over bilat deltoid regions, distal NMS intact with bilat hands having intact and equal sensation and strength in the distribution of the median, radial, and ulnar nerve function compared to opposite side.  Strong radial pulses.  +FROM bilat elbows with intact motor strength biceps and triceps muscles to resistance.  +mildly tender to palp right lateral maleolar area w/localized edema, NMS intact right foot, strong pedal pp, LE muscle compartments soft.  No right proximal fibular head tenderness, no knee tenderness, no foot tenderness.  No deformity, no ecchymosis, no open wounds. +plantarflexion of right foot w/calf squeeze.  No palpable gap right Achilles's tendon.  No calf edema or asymmetry.; Neuro: AA&Ox3, Major CN grossly intact.  Speech clear. No gross focal motor or sensory deficits in extremities.; Skin: Color normal, Warm, Dry.   ED Course  Procedures       MDM  MDM Reviewed: previous chart, nursing note and vitals Reviewed previous: labs Interpretation: labs, ECG, x-ray and CT scan Total time providing critical care: 30-74 minutes. This excludes time spent performing separately reportable procedures and services. Consults: admitting MD   CRITICAL CARE Performed by: Laray Anger Total critical care time: 35 Critical care time was exclusive of separately billable procedures and treating other patients. Critical care was necessary to treat or prevent imminent or life-threatening  deterioration. Critical care was time spent personally by me on the following activities: development of treatment plan with patient and/or surrogate as well as nursing, discussions with consultants, evaluation of patient's response to treatment, examination of patient, obtaining history from patient or surrogate, ordering and performing treatments and interventions, ordering and review of laboratory studies, ordering and review of radiographic studies, pulse oximetry and re-evaluation of patient's condition.    Date: 10/15/2012  Rate: 112  Rhythm: sinus tachycardia  QRS Axis: normal  Intervals: normal  ST/T Wave abnormalities: normal  Conduction Disutrbances:none  Narrative Interpretation:   Old EKG Reviewed: none available.  Results for orders placed during the hospital encounter of 10/15/12  GLUCOSE, CAPILLARY      Result Value Range   Glucose-Capillary 324 (*) 70 - 99 mg/dL  URINALYSIS, ROUTINE W REFLEX MICROSCOPIC      Result Value Range   Color, Urine YELLOW  YELLOW   APPearance CLEAR  CLEAR   Specific Gravity, Urine >1.030 (*) 1.005 - 1.030  pH 6.0  5.0 - 8.0   Glucose, UA 500 (*) NEGATIVE mg/dL   Hgb urine dipstick TRACE (*) NEGATIVE   Bilirubin Urine MODERATE (*) NEGATIVE   Ketones, ur >80 (*) NEGATIVE mg/dL   Protein, ur 30 (*) NEGATIVE mg/dL   Urobilinogen, UA 1.0  0.0 - 1.0 mg/dL   Nitrite NEGATIVE  NEGATIVE   Leukocytes, UA NEGATIVE  NEGATIVE  BASIC METABOLIC PANEL      Result Value Range   Sodium 131 (*) 135 - 145 mEq/L   Potassium 4.1  3.5 - 5.1 mEq/L   Chloride 90 (*) 96 - 112 mEq/L   CO2 16 (*) 19 - 32 mEq/L   Glucose, Bld 327 (*) 70 - 99 mg/dL   BUN 6  6 - 23 mg/dL   Creatinine, Ser 1.61  0.50 - 1.35 mg/dL   Calcium 09.6  8.4 - 04.5 mg/dL   GFR calc non Af Amer >90  >90 mL/min   GFR calc Af Amer >90  >90 mL/min  CBC WITH DIFFERENTIAL      Result Value Range   WBC 16.4 (*) 4.0 - 10.5 K/uL   RBC 5.16  4.22 - 5.81 MIL/uL   Hemoglobin 15.1  13.0 - 17.0  g/dL   HCT 40.9  81.1 - 91.4 %   MCV 83.9  78.0 - 100.0 fL   MCH 29.3  26.0 - 34.0 pg   MCHC 34.9  30.0 - 36.0 g/dL   RDW 78.2 (*) 95.6 - 21.3 %   Platelets 188  150 - 400 K/uL   Neutrophils Relative % 85 (*) 43 - 77 %   Lymphocytes Relative 7 (*) 12 - 46 %   Monocytes Relative 8  3 - 12 %   Eosinophils Relative 0  0 - 5 %   Basophils Relative 0  0 - 1 %   Neutro Abs 14.0 (*) 1.7 - 7.7 K/uL   Lymphs Abs 1.1  0.7 - 4.0 K/uL   Monocytes Absolute 1.3 (*) 0.1 - 1.0 K/uL   Eosinophils Absolute 0.0  0.0 - 0.7 K/uL   Basophils Absolute 0.0  0.0 - 0.1 K/uL   Smear Review LARGE PLATELETS PRESENT    TROPONIN I      Result Value Range   Troponin I <0.30  <0.30 ng/mL  D-DIMER, QUANTITATIVE      Result Value Range   D-Dimer, Quant 1.19 (*) 0.00 - 0.48 ug/mL-FEU  URINE MICROSCOPIC-ADD ON      Result Value Range   Squamous Epithelial / LPF RARE  RARE   WBC, UA 0-2  <3 WBC/hpf   RBC / HPF 3-6  <3 RBC/hpf   Bacteria, UA FEW (*) RARE  BLOOD GAS, ARTERIAL      Result Value Range   FIO2 21.00     O2 Content 89.3     pH, Arterial 7.336 (*) 7.350 - 7.450   pCO2 arterial 22.4 (*) 35.0 - 45.0 mmHg   pO2, Arterial 89.3  80.0 - 100.0 mmHg   Bicarbonate 11.6 (*) 20.0 - 24.0 mEq/L   TCO2 10.4  0 - 100 mmol/L   Acid-base deficit 13.1 (*) 0.0 - 2.0 mmol/L   O2 Saturation 96.4     Patient temperature 37.0     Collection site LEFT BRACHIAL     Drawn by COLLECTED BY RT     Sample type ARTERIAL     Allens test (pass/fail) PASS  PASS   Dg Chest 2 View 10/15/2012   *  RADIOLOGY REPORT*  Clinical Data: Chest pain.  Fatigue.  Obesity.  CHEST - 2 VIEW  Comparison:  11/30/2011  Findings:  The heart size and mediastinal contours are within normal limits.  Both lungs are clear.  The visualized skeletal structures are unremarkable.  IMPRESSION: No active cardiopulmonary disease.   Original Report Authenticated By: Myles Rosenthal, M.D.   Dg Shoulder Right 10/15/2012   *RADIOLOGY REPORT*  Clinical Data: right shoulder  pain.  RIGHT SHOULDER - 2+ VIEW  Comparison: None.  Findings: No evidence of acute fracture or dislocation.  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  Moderate degenerative change are seen involving the acromioclavicular joint and there is bony spurring of the undersurface of the acromion.  IMPRESSION:  1.  No acute findings. 2.  Degenerative changes, as described above.  These findings predispose to shoulder impingement syndrome.   Original Report Authenticated By: Myles Rosenthal, M.D.   Dg Ankle Complete Right 10/15/2012   *RADIOLOGY REPORT*  Clinical Data: Right ankle pain.  Obesity.  Fatigue.  RIGHT ANKLE - COMPLETE 3+ VIEW  Comparison: None.  Findings: No evidence of acute fracture or dislocation.  No evidence of ankle joint arthropathy.  Old fracture deformity of the calcaneus noted with prominent plantar calcaneal spur.  IMPRESSION: No acute findings.   Original Report Authenticated By: Myles Rosenthal, M.D.   Dg Shoulder Left 10/15/2012   *RADIOLOGY REPORT*  Clinical Data: Left shoulder pain.  LEFT SHOULDER - 2+ VIEW  Comparison: None.  Findings: No evidence of acute fracture or dislocation.  Mild degenerative changes are seen involving the acromioclavicular joint.  No other significant bone abnormality identified.  IMPRESSION:  1.  No acute findings. 2.  Mild acromioclavicular degenerative changes.   Original Report Authenticated By: Myles Rosenthal, M.D.    Ct Angio Chest Pe W/cm &/or Wo Cm 10/15/2012   *RADIOLOGY REPORT*  Clinical Data: Shoulder pain, fatigue, elevated D-dimer.  CT ANGIOGRAPHY CHEST  Technique:  Multidetector CT imaging of the chest using the standard protocol during bolus administration of intravenous contrast. Multiplanar reconstructed images including MIPs were obtained and reviewed to evaluate the vascular anatomy.  Contrast: OMNIPAQUE IOHEXOL 350 MG/ML SOLN  Comparison: chest radiograph 10/15/2012.  Findings: There is some respiratory motion.  This is a  technically satisfactory examination of the pulmonary arterial tree.  No focal filling defects are identified in the pulmonary arteries to suggest pulmonary embolism.  Thoracic aorta is normal in caliber.  The thoracic aorta enhances normally.  Negative for dissection.  Heart size is normal.   No visualized atherosclerotic calcification in the thorax.  The proximal great vessels appear normal.  Probable small hiatal hernia.  Negative for pleural pericardial effusion. Thyroid gland unremarkable.  In the right lower lobe, adjacent to the major fissure and the visceral pleura, is an elongate somewhat nodular density that measures 2.8 cm craniocaudal span and 0.8 x 0.8 cm axial diameter. This oblong density is not visible on chest radiograph and there are no prior cross-sectional imaging studies of the chest for comparison.  The trachea and mainstem bronchi are patent.  No pulmonary consolidation or interstitial abnormalities identified.  Imaged portion of the upper abdomen shows no acute abnormality or mass.  There is a contiguous linear calcification of the anterior aspect of the thoracic spine vertebral bodies/anterior longitudinal ligament.  There is interspinous ligament calcification in the thoracic spine at multiple levels.  There are subchondral cysts in the clavicular heads and upper sternum at the level of the  sternoclavicular joints bilaterally.  IMPRESSION: 1.  Negative for pulmonary embolism. 2.  Oblong/linear nodular density in the peripheral right lower lobe is likely due to benign scarring.  There are no prior chest CTs for comparison.  Follow-up noncontrast chest CT in 6 months is suggested. 3.  Calcification of the anterior longitudinal ligament and thoracic spine interspinous ligament calcifications suggest the possibility of ankylosing spondylitis.  4.  Cystic degenerative changes of the sternoclavicular joints bilaterally.   Original Report Authenticated By: Britta Mccreedy, M.D.     1650:  Pt's  CBG 327, but CO2 16 and AG 25.  Will start IVF NS bolus and IV insulin gtt for DKA.  Pt continues mildly tachycardic in 100-110's. EKG without acute STTW changes, troponin normal. D-dimer elevated; obtained CT-A chest without acute aortic pathology or PE.  Dx and testing d/w pt and family.  Questions answered.  Verb understanding, agreeable to admit.  T/C to [facility] Dr. Irene Limbo, case discussed, including:  HPI, pertinent PM/SHx, VS/PE, dx testing, ED course and treatment:  Agreeable to admit, requests to write temporary orders, obtain stepdown bed to team 1.            Laray Anger, DO 10/16/12 1254

## 2012-10-15 NOTE — H&P (Signed)
History and Physical  Luke [name] ZOX:[id number] DOB: October 16, 1960 DOA: 10/15/2012  Referring physician: Samuel Jester, MD PCP: No primary provider on file.   Chief Complaint: Left shoulder and right ankle pain  HPI:  52 year old man presented to the emergency department with complaints of increased fatigue, left shoulder pain and right ankle pain. He was recently diagnosed diabetes as well and placed on metformin. Initial evaluation in the emergency room was notable for diabetic ketoacidosis and the patient was referred for admission.  The patient has felt generally weak for the last 5-7 days, starting with some bilateral knee pain which self resolved. He then developed pain in his right shoulder which migrated to his left shoulder and some bright ankle pain. He went to urgent care 6/12 to have blood work drawn. He was called back on 6/13 and notified that he had been diagnosed with diabetes and he was started on metformin. He reports he has lost 90 or more pounds over the last year without trying and has polyuria. He has a positive family history for diabetes. He has had no chest pain or shortness of breath.  In the emergency department he was noted to be afebrile, tachycardic, normotensive with no hypoxia. Anion gap was noted to be elevated, white blood cell count 16.4, troponin negative. D-dimer was elevated. Plain films of bilateral shoulders, chest and right ankle were unremarkable for acute process. He was treated with IV fluids and started on insulin infusion.  Review of Systems:  Negative for fever, changes to his vision, rash, chest pain, shortness of breath, dysuria, bleeding, nausea, vomiting, abdominal pain  Positive for sore throat, polyuria, weight loss  Past Medical History  Diagnosis Date  . DM (diabetes mellitus)     Past Surgical History  Procedure Laterality Date  . No past surgeries      Social History:  reports that he has been smoking.  He does not have any  smokeless tobacco history on file. He reports that he drinks about 0.6 ounces of alcohol per week. He reports that he does not use illicit drugs.  Allergies  Allergen Reactions  . Bee Venom Swelling  . Shellfish Allergy Nausea And Vomiting    Family History  Problem Relation Age of Onset  . Arthritis    . Diabetes       Prior to Admission medications   Medication Sig Start Date End Date Taking? Authorizing Provider  diphenhydrAMINE (BENADRYL) 25 MG tablet Take 25-50 mg by mouth daily as needed. For itching and allergy symptoms   Yes Historical Provider, MD  ibuprofen (ADVIL,MOTRIN) 200 MG tablet Take 400 mg by mouth as needed. For pain   Yes Historical Provider, MD  metFORMIN (GLUCOPHAGE) 500 MG tablet Take 500 mg by mouth 2 (two) times daily with a meal.   Yes Historical Provider, MD  Naproxen Sodium (ALEVE PO) Take 1 tablet by mouth daily as needed (knee pain).   Yes Historical Provider, MD  Oxymetazoline HCl (NASAL SPRAY NA) Place 2-3 sprays into the nose every 8 (eight) hours as needed. For allergy symptoms    Yes Historical Provider, MD   Physical Exam: Filed Vitals:   10/15/12 1321 10/15/12 1549 10/15/12 1551  BP: 114/81 139/70 118/67  Pulse: 118 110 118  Temp: 98.1 F (36.7 C)    Resp: 20    Height: 5\' 6"  (1.676 m)    Weight: 111.585 kg (246 lb)    SpO2: 99%      General:  Appears calm and  comfortable. Eyes: Pupils equal, round, react to light. Normal lids, irises. ENT: Grossly normal hearing. Lips and tongue appear normal. Neck: No lymphadenopathy or masses. No thyromegaly Cardiovascular: Regular rhythm, tachycardic, no murmur, rub, gallop. No lower extremity edema. Respiratory clear to auscultation bilaterally. No wheezes, rales, rhonchi. Normal respiratory effort. Abdomen soft, nontender, nondistended, obese Skin no rash or induration seen. Musculoskeletal grossly normal tone and strength upper and lower extremities bilaterally. Right ankle appears unremarkable.  Patient able to lift both arms above his head although range of movement at the right shoulder is somewhat impaired. Psychiatric grossly normal mood and affect. Speech fluent and appropriate.  Neurologic grossly intact  Wt Readings from Last 3 Encounters:  10/15/12 111.585 kg (246 lb)  11/30/11 154.223 kg (340 lb)  09/22/11 145.151 kg (320 lb)    Labs on Admission:  Basic Metabolic Panel:  Recent Labs Lab 10/15/12 1520  NA 131*  K 4.1  CL 90*  CO2 16*  GLUCOSE 327*  BUN 6  CREATININE 0.52  CALCIUM 10.0   CBC:  Recent Labs Lab 10/15/12 1520  WBC 16.4*  NEUTROABS 14.0*  HGB 15.1  HCT 43.3  MCV 83.9  PLT 188    Cardiac Enzymes:  Recent Labs Lab 10/15/12 1520  TROPONINI <0.30   CBG:  Recent Labs Lab 10/15/12 1329  GLUCAP 324*     Radiological Exams on Admission: Dg Chest 2 View  10/15/2012   *RADIOLOGY REPORT*  Clinical Data: Chest pain.  Fatigue.  Obesity.  CHEST - 2 VIEW  Comparison:  11/30/2011  Findings:  The heart size and mediastinal contours are within normal limits.  Both lungs are clear.  The visualized skeletal structures are unremarkable.  IMPRESSION: No active cardiopulmonary disease.   Original Report Authenticated By: Myles Rosenthal, M.D.   Dg Shoulder Right  10/15/2012   *RADIOLOGY REPORT*  Clinical Data: right shoulder pain.  RIGHT SHOULDER - 2+ VIEW  Comparison: None.  Findings: No evidence of acute fracture or dislocation.  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  Moderate degenerative change are seen involving the acromioclavicular joint and there is bony spurring of the undersurface of the acromion.  IMPRESSION:  1.  No acute findings. 2.  Degenerative changes, as described above.  These findings predispose to shoulder impingement syndrome.   Original Report Authenticated By: Myles Rosenthal, M.D.   Dg Ankle Complete Right  10/15/2012   *RADIOLOGY REPORT*  Clinical Data: Right ankle pain.  Obesity.  Fatigue.  RIGHT ANKLE -  COMPLETE 3+ VIEW  Comparison: None.  Findings: No evidence of acute fracture or dislocation.  No evidence of ankle joint arthropathy.  Old fracture deformity of the calcaneus noted with prominent plantar calcaneal spur.  IMPRESSION: No acute findings.   Original Report Authenticated By: Myles Rosenthal, M.D.   Dg Shoulder Left  10/15/2012   *RADIOLOGY REPORT*  Clinical Data: Left shoulder pain.  LEFT SHOULDER - 2+ VIEW  Comparison: None.  Findings: No evidence of acute fracture or dislocation.  Mild degenerative changes are seen involving the acromioclavicular joint.  No other significant bone abnormality identified.  IMPRESSION:  1.  No acute findings. 2.  Mild acromioclavicular degenerative changes.   Original Report Authenticated By: Myles Rosenthal, M.D.    EKG: Independently reviewed. Sinus tachycardia, no acute changes.   Principal Problem:   DKA (diabetic ketoacidoses) Active Problems:   Diabetes mellitus type 2 in obese   Assessment/Plan 1. Diabetic ketoacidosis: Admit to step down unit, replace fluids and start IV  insulin. Monitor blood sugars closely, transition to subcutaneous insulin preferably once stable. 2. Dehydration: Secondary to hypoglycemia. IV fluids. 3. Leukocytosis: Likely secondary to acute illness. Repeat CBC in the morning. 4. Elevated d-dimer: CTA of the chest pending but clinical probability is low and history does not suggest PE. 5. Left shoulder pain, right ankle pain: Shoulder films negative bilaterally for acute process. They may be secondary to shoulder impingement syndrome. This can be followed up in the outpatient setting.  Code Status:  Full code Family Communication: None present Disposition Plan/Anticipated LOS: Home when improved, 2 days  Time spent: 50 minutes  Brendia Sacks, MD  [facility] Pager [phone] 10/15/2012, 4:52 PM

## 2012-10-15 NOTE — Plan of Care (Signed)
Problem: Consults Goal: Diabetic Ketoacidosis (DKA) Patient Education See Patient Education Modules for education specifics.  Outcome: Progressing Handouts given

## 2012-10-15 NOTE — ED Notes (Signed)
Pt presents with c/o rt ankle pain and left shoulder pain, denies injury. Pt reports ankle started hurting last night while sitting in chair and awoke this morning with left shoulder pain.  Pt has full ROM to both extremities. Pt also c/o weakness x 3 days. [facility] and placed on metformin secondary to hyperglycemia. Pt denies prior diagnosis of diabetes. Denies n/v at this time. NAD noted

## 2012-10-15 NOTE — ED Notes (Signed)
Patient refused to stand during the orthostatic vitals. RN made aware.

## 2012-10-15 NOTE — ED Notes (Signed)
States that he saw his PCP on Friday.  States that he was newly diagnosed with diabetes and prescribed metformin.  States that he is having increased fatigue and left shoulder pain and right ankle pain.  States that he informed his PCP of these problems and his PCP informed him to follow up with gastroenterology and endocrinology.  The patient states that he is here today because of his shoulder and ankle pain.

## 2012-10-16 DIAGNOSIS — E1110 Type 2 diabetes mellitus with ketoacidosis without coma: Secondary | ICD-10-CM

## 2012-10-16 DIAGNOSIS — R634 Abnormal weight loss: Secondary | ICD-10-CM

## 2012-10-16 HISTORY — DX: Type 2 diabetes mellitus with ketoacidosis without coma: E11.10

## 2012-10-16 HISTORY — DX: Abnormal weight loss: R63.4

## 2012-10-16 LAB — CBC
HCT: 39.8 % (ref 39.0–52.0)
Hemoglobin: 13.8 g/dL (ref 13.0–17.0)
RBC: 4.8 MIL/uL (ref 4.22–5.81)
WBC: 16.7 10*3/uL — ABNORMAL HIGH (ref 4.0–10.5)

## 2012-10-16 LAB — BASIC METABOLIC PANEL
BUN: 4 mg/dL — ABNORMAL LOW (ref 6–23)
Calcium: 9.3 mg/dL (ref 8.4–10.5)
Chloride: 96 mEq/L (ref 96–112)
GFR calc Af Amer: >90 mL/min (ref >90)
GFR calc Af Amer: >90 mL/min (ref >90)
GFR calc Af Amer: >90 mL/min (ref >90)
GFR calc non Af Amer: >90 mL/min (ref >90)
GFR calc non Af Amer: >90 mL/min (ref >90)
Glucose, Bld: 118 mg/dL — ABNORMAL HIGH (ref 70–99)
Potassium: 3 mEq/L — ABNORMAL LOW (ref 3.5–5.1)
Potassium: 3.3 mEq/L — ABNORMAL LOW (ref 3.5–5.1)
Potassium: 3.5 mEq/L (ref 3.5–5.1)
Sodium: 131 mEq/L — ABNORMAL LOW (ref 135–145)
Sodium: 132 mEq/L — ABNORMAL LOW (ref 135–145)
Sodium: 132 mEq/L — ABNORMAL LOW (ref 135–145)

## 2012-10-16 LAB — GLUCOSE, CAPILLARY
Glucose-Capillary: 132 mg/dL — ABNORMAL HIGH (ref 70–99)
Glucose-Capillary: 154 mg/dL — ABNORMAL HIGH (ref 70–99)
Glucose-Capillary: 163 mg/dL — ABNORMAL HIGH (ref 70–99)
Glucose-Capillary: 171 mg/dL — ABNORMAL HIGH (ref 70–99)
Glucose-Capillary: 175 mg/dL — ABNORMAL HIGH (ref 70–99)
Glucose-Capillary: 175 mg/dL — ABNORMAL HIGH (ref 70–99)
Glucose-Capillary: 176 mg/dL — ABNORMAL HIGH (ref 70–99)

## 2012-10-16 LAB — URINE CULTURE

## 2012-10-16 LAB — HEMOGLOBIN A1C: Mean Plasma Glucose: 410 mg/dL — ABNORMAL HIGH (ref <117)

## 2012-10-16 MED ORDER — ADULT MULTIVITAMIN W/MINERALS CH
1.0000 | ORAL_TABLET | Freq: Every day | ORAL | Status: DC
  Administered 2012-10-16 – 2012-10-21 (×6): 1 via ORAL
  Filled 2012-10-16 (×6): qty 1

## 2012-10-16 MED ORDER — INSULIN PEN STARTER KIT
1.0000 | Freq: Once | Status: AC
  Administered 2012-10-16: 1
  Filled 2012-10-16: qty 1

## 2012-10-16 MED ORDER — POTASSIUM CHLORIDE CRYS ER 20 MEQ PO TBCR
40.0000 meq | EXTENDED_RELEASE_TABLET | Freq: Two times a day (BID) | ORAL | Status: AC
  Administered 2012-10-16 (×2): 40 meq via ORAL
  Filled 2012-10-16 (×2): qty 2

## 2012-10-16 MED ORDER — INSULIN ASPART 100 UNIT/ML ~~LOC~~ SOLN
0.0000 [IU] | Freq: Three times a day (TID) | SUBCUTANEOUS | Status: DC
  Administered 2012-10-16: 3 [IU] via SUBCUTANEOUS
  Administered 2012-10-17: 5 [IU] via SUBCUTANEOUS

## 2012-10-16 MED ORDER — POTASSIUM CHLORIDE 10 MEQ/100ML IV SOLN
10.0000 meq | INTRAVENOUS | Status: AC
  Administered 2012-10-16 (×4): 10 meq via INTRAVENOUS
  Filled 2012-10-16: qty 400

## 2012-10-16 MED ORDER — INSULIN GLARGINE 100 UNIT/ML ~~LOC~~ SOLN
10.0000 [IU] | Freq: Every day | SUBCUTANEOUS | Status: DC
  Administered 2012-10-16: 10 [IU] via SUBCUTANEOUS
  Filled 2012-10-16 (×2): qty 0.1

## 2012-10-16 MED ORDER — NICOTINE 14 MG/24HR TD PT24
14.0000 mg | MEDICATED_PATCH | Freq: Every day | TRANSDERMAL | Status: DC
  Administered 2012-10-16 – 2012-10-21 (×6): 14 mg via TRANSDERMAL
  Filled 2012-10-16 (×6): qty 1

## 2012-10-16 MED ORDER — PRO-STAT SUGAR FREE PO LIQD
30.0000 mL | Freq: Two times a day (BID) | ORAL | Status: DC
  Administered 2012-10-16 – 2012-10-21 (×10): 30 mL via ORAL
  Filled 2012-10-16 (×10): qty 30

## 2012-10-16 MED ORDER — LIVING WELL WITH DIABETES BOOK
Freq: Once | Status: AC
  Administered 2012-10-16: 14:00:00
  Filled 2012-10-16: qty 1

## 2012-10-16 MED ORDER — GLUCERNA SHAKE PO LIQD
237.0000 mL | Freq: Two times a day (BID) | ORAL | Status: DC
  Administered 2012-10-16 – 2012-10-21 (×6): 237 mL via ORAL

## 2012-10-16 MED ORDER — MORPHINE SULFATE 2 MG/ML IJ SOLN
2.0000 mg | INTRAMUSCULAR | Status: DC | PRN
  Administered 2012-10-16 – 2012-10-21 (×5): 2 mg via INTRAVENOUS
  Filled 2012-10-16 (×5): qty 1

## 2012-10-16 NOTE — Progress Notes (Addendum)
Inpatient Diabetes Program Recommendations  AACE/ADA: New Consensus Statement on Inpatient Glycemic Control (2013)  Target Ranges:  Prepandial:   less than 140 mg/dL      Peak postprandial:   less than 180 mg/dL (1-2 hours)      Critically ill patients:  140 - 180 mg/dL     Admitted with DKA.  Per patient, he was just diagnosed with DM last Friday (06/13) at the Urgent Care clinic.  Was given a Rx for Metformin 500 mg bid and sent home.  Back to ED at [facility] hospital with weakness, urinary frequency, etc. Currently on IV insulin drip.  Per Dr. Irene Limbo, patient to be transitioned off IV insulin drip today to Lantus and Novolog.  Spoke to patient via telephone.  Patient states he was given information on local physicians so he can get established with a PCP near home.  RNs are currently providing basic DM education to patient.  Living well with DM booklet ordered for patient.  Insulin pen starter kit ordered for patient.  Patient to watch DM videos [phone] on patient education network.  RD consult also ordered for DM diet education.  Have asked RNs to please show patient how to use insulin pen for home and to allow patient to give himself injections with the hospital syringes for practice while here.  Encouraged patient to call Altamese Cabal, RD at [facility] to register for free DM classes at [facility] campus after d/c.  Contact information placed on patient's AVS.   **Noted Dr. Irene Limbo ordered Lantus 10 units for patient to transition off IV insulin drip today- May need more given his weight of 109.7 kg (0.2 units/kg would be ~20 units basal insulin)  **MD- Please also enter order for Novolog Moderate correction scale (SSI)  Will follow. Ambrose Finland RN, MSN, CDE Diabetes Coordinator Inpatient Diabetes Program [phone]

## 2012-10-16 NOTE — Plan of Care (Signed)
Problem: Phase I Progression Outcomes Goal: OOB as tolerated unless otherwise ordered Outcome: Not Progressing Complains of right ankle/foot pain with . Dr. Irene Limbo was notified that pt was having foot pain.

## 2012-10-16 NOTE — Progress Notes (Signed)
[facility] PROGRESS NOTE  Luke [name] JXB:[id number] DOB: Sep 13, 1960 DOA: 10/15/2012 PCP: No primary provider on file.  Assessment/Plan: 1. DKA: Clinically improving. Continue insulin infusion until gap clear. 2. Diabetes mellitus type 2, uncontrolled: Followup hemoglobin A1c. Transition to subcutaneous insulin when off insulin infusion. Will need close outpatient followup. Diabetic teaching. Nutrition consultation. 3. Hypokalemia: Replete. 4. Leukocytosis: Significance unclear. No signs or symptoms of infection. Recheck CBC in the morning. 5. Left shoulder pain, right ankle pain: X-ray is negative for acute process. Suspect arthritis, shoulder impingement. Followup as an outpatient.   Continue insulin infusion until anion gap cleared. Repeat basic metabolic panel.  Remain in step down until off insulin infusion.  Consult care management for PCP options  Replace potassium  Repeat CBC in the morning  Insulin teaching, patient agreeable.  Code Status: Full code DVT prophylaxis: SCDs Family Communication: None present Disposition Plan: Likely home 6/70  Brendia Sacks, MD  [facility]  Pager [phone] If 7PM-7AM, please contact night-coverage at [email or web address], password [facility] 10/16/2012, 8:28 AM  LOS: 1 day   Clinical Summary: 52 year old man presented to the emergency department with complaints of increased fatigue, left shoulder pain and right ankle pain. He was recently diagnosed diabetes as well and placed on metformin. Initial evaluation in the emergency room was notable for diabetic ketoacidosis and the patient was referred for admission.   Consultants:  None  Procedures:  None  Antibiotics:  None  HPI/Subjective: Overall he feels better. Right ankle pain has resolved. Bilateral shoulder pain has improved. No nausea, vomiting or abdominal pain today, some nausea last night.  Objective: Filed Vitals:   10/16/12 0400 10/16/12 0500 10/16/12 0600  10/16/12 0727  BP: 144/42 141/71 126/77   Pulse: 121  111   Temp: 98.4 F (36.9 C)   99.2 F (37.3 C)  TempSrc: Oral   Oral  Resp: 23 20 19    Height:      Weight:  109.7 kg (241 lb 13.5 oz)    SpO2: 98%  97%     Intake/Output Summary (Last 24 hours) at 10/16/12 0828 Last data filed at 10/16/12 0820  Gross per 24 hour  Intake 1580.47 ml  Output    900 ml  Net 680.47 ml     Filed Weights   10/15/12 1321 10/15/12 1803 10/16/12 0500  Weight: 111.585 kg (246 lb) 106.7 kg (235 lb 3.7 oz) 109.7 kg (241 lb 13.5 oz)    Exam:   General: Appears calm and comfortable. Speech fluent and clear.  Cardiovascular tachycardic, regular rhythm, no murmur, rub, gallop. No lower extremity edema.  Telemetry sinus tachycardia, no arrhythmias  Respiratory clear to auscultation bilaterally. No wheezes, rales, rhonchi. Normal respiratory effort.  Abdomen is obese, soft, nontender  Musculoskeletal appears unchanged  Psychiatric grossly normal mood and affect. Speech is fluent and appropriate.  Data Reviewed:  Capillary blood sugars now controlled. Potassium 3.3. Anion gap decreasing. Leukocytosis persists.  Pending studies:   Hemoglobin A1c  Urine culture  Scheduled Meds: . Chlorhexidine Gluconate Cloth  6 each Topical Q0600  . mupirocin ointment  1 application Nasal BID  . pneumococcal 23 valent vaccine  0.5 mL Intramuscular Tomorrow-1000  . potassium chloride  10 mEq Intravenous Q1 Hr x 4   Continuous Infusions: . sodium chloride Stopped (10/15/12 1815)  . dextrose 5 % and 0.45% NaCl 125 mL/hr at 10/16/12 0600  . insulin (NOVOLIN-R) infusion 3 Units/hr (10/16/12 0820)    Principal Problem:   DKA (diabetic ketoacidoses) Active Problems:  Diabetes mellitus type 2 in obese   Time spent 15 minutes

## 2012-10-16 NOTE — Progress Notes (Signed)
Notified Dr. Alanda Slim that pt is still complaining of pain 8/10 even after giving 5/325 of Norco.

## 2012-10-16 NOTE — Progress Notes (Signed)
INITIAL NUTRITION ASSESSMENT  DOCUMENTATION CODES Per approved criteria  -Obesity Unspecified   INTERVENTION:  Diabetes diet education provided  Glucerna Shake po BID, each supplement provides 220 kcal and 10 grams of protein.  ProStat 30 ml TID (each 30 ml provides 100 kcal, 15 gr protein)  Recommend pt follow up with outpatient diabetes class   NUTRITION DIAGNOSIS: Knowledge deficit related to lack of prior diabetic diet edcuation  as evidenced by New DM diagnosis  Goal: Pt will comply with carbohydrate modified diet and practice consistent portion control during hospital stay  Monitor:  Po intake, labs and wt trends  Reason for Assessment: Consult for diet education (New Diabetic), Malnutrition Screen Score = 5  52 y.o. male  Admitting Dx: DKA (diabetic ketoacidoses)  ASSESSMENT: Pt wt hx reflects severe unintentional wt loss of 98#, 29% over past year. Pt reports usually eats sausage and eggs, biscuits for breakfast , sometimes ceral, fruit for lunch and regular dinner in the evening. PTA drinks regular sodas.  No results found for this basename: HGBA1C   RD provided "Carbohydrate Counting for People with Diabetes" handout from the Academy of Nutrition and Dietetics. Discussed different food groups and their effects on blood sugar, emphasizing carbohydrate-containing foods. Provided list of carbohydrates and recommended serving sizes of common foods.  Discussed importance of controlled and consistent carbohydrate intake throughout the day. Provided examples of ways to balance meals/snacks and encouraged intake of high-fiber, whole grain complex carbohydrates. Teach back method used.  Expect fair compliance.  Height: Ht Readings from Last 1 Encounters:  10/15/12 5\' 6"  (1.676 m)    Weight: Wt Readings from Last 1 Encounters:  10/16/12 241 lb 13.5 oz (109.7 kg)    Ideal Body Weight: 142# (64.5 kg)  % Ideal Body Weight: 170%  Wt Readings from Last 10 Encounters:   10/16/12 241 lb 13.5 oz (109.7 kg)  11/30/11 340 lb (154.223 kg)  09/22/11 320 lb (145.151 kg)  12/22/10 334 lb (151.501 kg)  12/16/10 360 lb (163.295 kg)    Usual Body Weight: 320-340#  % Usual Body Weight: 71%  BMI:  Body mass index is 39.05 kg/(m^2).Obesity Class II   Estimated Nutritional Needs: Kcal: 1430-1560 (to promote gradual wt decrease) Protein: 98-110 gr Fluid: > 2500 ml/day  Skin: No issues noted  Diet Order: Carb Control po 40%  EDUCATION NEEDS: -Education needs addressed   Intake/Output Summary (Last 24 hours) at 10/16/12 1452 Last data filed at 10/16/12 1300  Gross per 24 hour  Intake 2816.97 ml  Output   1525 ml  Net 1291.97 ml    Last BM:  10/13/12  Labs:   Recent Labs Lab 10/16/12 0026 10/16/12 0430 10/16/12 1213  NA 131* 132* 132*  K 3.0* 3.3* 3.5  CL 96 95* 99  CO2 18* 21 21  BUN 4* 4* 4*  CREATININE 0.41* 0.47* 0.43*  CALCIUM 9.1 9.5 9.3  GLUCOSE 179* 118* 203*    CBG (last 3)   Recent Labs  10/16/12 0827 10/16/12 0912 10/16/12 1043  GLUCAP 175* 175* 171*    Scheduled Meds: . Chlorhexidine Gluconate Cloth  6 each Topical Q0600  . insulin glargine  10 Units Subcutaneous Daily  . Insulin Pen Starter Kit  1 kit Other Once  . living well with diabetes book   Does not apply Once  . mupirocin ointment  1 application Nasal BID  . nicotine  14 mg Transdermal Daily  . potassium chloride  40 mEq Oral BID    Continuous  Infusions: . sodium chloride Stopped (10/15/12 1815)  . dextrose 5 % and 0.45% NaCl 125 mL/hr at 10/16/12 1334  . insulin (NOVOLIN-R) infusion 3.9 Units/hr (10/16/12 1428)    Past Medical History  Diagnosis Date  . DM (diabetes mellitus)     Past Surgical History  Procedure Laterality Date  . No past surgeries      Royann Shivers MS,RD,LDN,CSG Office: #[phone] Pager: [phone]

## 2012-10-16 NOTE — Progress Notes (Signed)
Report called to Earnstine Regal, RN and pt being transferred to room 325. Pt educated extensively about DKA, insulin administration, checking blood sugar and pt gave himself insulin at dinner time. Family aware of room change. Pt was transferred via wheelchair along with personal belongings (clothing and shoes) and personal hygiene items.

## 2012-10-16 NOTE — Care Management Note (Signed)
    Page 1 of 2   10/20/2012     3:51:52 PM   CARE MANAGEMENT NOTE 10/20/2012  Patient:  Luke [name], Luke [name]   Account Number:  [id number]  Date Initiated:  10/16/2012  Documentation initiated by:  Anibal Henderson  Subjective/Objective Assessment:   admitted to SD with DKA. pt is from home and will be returning home at D/C. He is a new diabetic and will need teaching and supplies. Pt is in pain and waiting for medication at present.     Action/Plan:   Diabetes Coordinator has spoken to pt and is ordering supplies for pt. Will return when pt feels more like talking and go over D/C plan of caring for his diabetes. Will transfer to telemetry this pm, after insulin drip D/C.   Anticipated DC Date:  10/18/2012   Anticipated DC Plan:  HOME W HOME HEALTH SERVICES      DC Planning Services  CM consult      [facility] Choice  HOME HEALTH   Choice offered to / List presented to:  C-1 Patient        HH arranged  HH-1 RN  HH-2 PT      [facility] agency  [facility]   Status of service:  Completed, signed off Medicare Important Message given?   (If response is "NO", the following Medicare IM given date fields will be blank) Date Medicare IM given:   Date Additional Medicare IM given:    Discharge Disposition:  HOME W HOME HEALTH SERVICES  Per UR Regulation:  Reviewed for med. necessity/level of care/duration of stay  If discussed at Long Length of Stay Meetings, dates discussed:    Comments:  10/20/12 1545 Arlyss Queen, RN BSN CM Pt potential discharge over the weekend. Pt was going to SNF at discharge but is unable to afford copay amts. Pt is now discharging home with [facility] RN and PT. Pt is going to stay with his sister, Luke [name] for several weeks at discharge. Linda lothian of [facility] is aware of pt and will collect pts orders from the computer. Weekend staff will call and fax pts sister address to [facility] once discharge written. HH to start within 48 hours of discharge. Pt and pts nurse aware  of discharge arrangements.  10/16/12 1330 geneva Bolden RN May need 2-3HH RN visits to monitor and  continue teaching

## 2012-10-16 NOTE — Plan of Care (Signed)
Problem: Phase I Progression Outcomes Goal: CBGs steadily decreasing on IV insulin drip Outcome: Completed/Met Date Met:  10/16/12 Off insulin drip now and lantus 10 units has been administered Goal: Acidosis resolving Outcome: Completed/Met Date Met:  10/16/12 Anion gap has now closed Goal: K+ level approaching normal with therapy Outcome: Completed/Met Date Met:  10/16/12 Potassium is now 3.5 Goal: Pain controlled with appropriate interventions Outcome: Progressing Norco 5/325 was ordered but still having some break through pain and notified Dr. Irene Limbo and he ordered 2 mg of Morphine q 4

## 2012-10-17 DIAGNOSIS — E1310 Other specified diabetes mellitus with ketoacidosis without coma: Principal | ICD-10-CM

## 2012-10-17 DIAGNOSIS — M109 Gout, unspecified: Secondary | ICD-10-CM

## 2012-10-17 DIAGNOSIS — E669 Obesity, unspecified: Secondary | ICD-10-CM | POA: Diagnosis present

## 2012-10-17 DIAGNOSIS — E1110 Type 2 diabetes mellitus with ketoacidosis without coma: Secondary | ICD-10-CM | POA: Insufficient documentation

## 2012-10-17 LAB — GLUCOSE, CAPILLARY
Glucose-Capillary: 264 mg/dL — ABNORMAL HIGH (ref 70–99)
Glucose-Capillary: 310 mg/dL — ABNORMAL HIGH (ref 70–99)
Glucose-Capillary: 350 mg/dL — ABNORMAL HIGH (ref 70–99)

## 2012-10-17 LAB — CBC
HCT: 40.3 % (ref 39.0–52.0)
Hemoglobin: 14 g/dL (ref 13.0–17.0)
MCH: 28.8 pg (ref 26.0–34.0)
MCHC: 34.7 g/dL (ref 30.0–36.0)
MCV: 82.9 fL (ref 78.0–100.0)
RBC: 4.86 MIL/uL (ref 4.22–5.81)

## 2012-10-17 MED ORDER — INSULIN ASPART 100 UNIT/ML ~~LOC~~ SOLN
0.0000 [IU] | Freq: Three times a day (TID) | SUBCUTANEOUS | Status: DC
  Administered 2012-10-17 (×2): 11 [IU] via SUBCUTANEOUS
  Administered 2012-10-18: 5 [IU] via SUBCUTANEOUS
  Administered 2012-10-18: 8 [IU] via SUBCUTANEOUS
  Administered 2012-10-18: 11 [IU] via SUBCUTANEOUS
  Administered 2012-10-19: 3 [IU] via SUBCUTANEOUS
  Administered 2012-10-19: 11 [IU] via SUBCUTANEOUS
  Administered 2012-10-19: 8 [IU] via SUBCUTANEOUS
  Administered 2012-10-20: 11 [IU] via SUBCUTANEOUS
  Administered 2012-10-20: 2 [IU] via SUBCUTANEOUS
  Administered 2012-10-20: 11 [IU] via SUBCUTANEOUS
  Administered 2012-10-21: 2 [IU] via SUBCUTANEOUS
  Administered 2012-10-21: 8 [IU] via SUBCUTANEOUS

## 2012-10-17 MED ORDER — INSULIN GLARGINE 100 UNIT/ML ~~LOC~~ SOLN
20.0000 [IU] | Freq: Every day | SUBCUTANEOUS | Status: DC
  Administered 2012-10-17: 20 [IU] via SUBCUTANEOUS
  Filled 2012-10-17 (×2): qty 0.2

## 2012-10-17 MED ORDER — NAPROXEN 250 MG PO TABS
500.0000 mg | ORAL_TABLET | Freq: Two times a day (BID) | ORAL | Status: DC
  Administered 2012-10-17 – 2012-10-18 (×2): 500 mg via ORAL
  Filled 2012-10-17 (×2): qty 2

## 2012-10-17 MED ORDER — INSULIN ASPART 100 UNIT/ML ~~LOC~~ SOLN
0.0000 [IU] | Freq: Every day | SUBCUTANEOUS | Status: DC
  Administered 2012-10-17: 22:00:00 via SUBCUTANEOUS
  Administered 2012-10-18: 5 [IU] via SUBCUTANEOUS
  Administered 2012-10-20: 3 [IU] via SUBCUTANEOUS

## 2012-10-17 MED ORDER — DOCUSATE SODIUM 100 MG PO CAPS
100.0000 mg | ORAL_CAPSULE | Freq: Two times a day (BID) | ORAL | Status: DC
  Administered 2012-10-17 – 2012-10-21 (×7): 100 mg via ORAL
  Filled 2012-10-17 (×8): qty 1

## 2012-10-17 MED ORDER — POLYETHYLENE GLYCOL 3350 17 G PO PACK
17.0000 g | PACK | Freq: Every day | ORAL | Status: DC
  Administered 2012-10-17 – 2012-10-21 (×4): 17 g via ORAL
  Filled 2012-10-17 (×4): qty 1

## 2012-10-17 NOTE — Progress Notes (Signed)
[facility] PROGRESS NOTE  Luke [name] ZOX:[id number] DOB: 07/22/1960 DOA: 10/15/2012 PCP: No primary provider on file.  Clinical Summary: 52 year old man presented to the emergency department with complaints of increased fatigue, left shoulder pain and right ankle pain. He was recently diagnosed diabetes as well and placed on metformin. Initial evaluation in the emergency room was notable for diabetic ketoacidosis and the patient was referred for admission. He rapidly improved with IV insulin and was transitioned to Lantus. He received instruction from nursing in self administration. He has also been treated empirically for right ankle gout. Would anticipate discharge 6/17 if blood sugars stable.  Assessment/Plan: 1. DKA: Clinically resolved. Secondary to untreated diabetes mellitus. Treat diabetes. 2. Diabetes mellitus, new diagnosis, uncontrolled by hemoglobin A1c 15.9: Capillary blood sugars remain high. Increase insulin. Continue teaching. Patient has been able to administer his own insulin. 3. Suspected right ankle gout: Empiric treatment with NSAIDs. 4. Leukocytosis: Suspect related to gout. No evidence of infection. Monitor clinically. 5. Bilateral shoulder pain: Overall stable. X-rays negative for acute process. Suspect arthritis, shoulder impingement. Can be followed up in the outpatient setting. 6.  Obesity unspecified: Ensure shake by mouth twice a day, Prostat 3 times a day 7. Nodular density right lower lobe: Likely secondary to benign scarring. Consider followup noncontrast chest CT in 6 months.   Increase Lantus to 20 units each bedtime  Increased sliding scale insulin to moderate  Empiric treatment for suspected gout  Encourage patient to call Altamese Cabal, RD at [facility] to register for free DM classes at [facility] after discharge  Anticipate discharge 6/18  Code Status: Full code DVT prophylaxis: SCDs Family Communication: None present Disposition Plan:  Likely home 6/18  Brendia Sacks, MD  [facility]  Pager [phone] If 7PM-7AM, please contact night-coverage at [email or web address], password [facility] 10/17/2012, 10:38 AM  LOS: 2 days   Consultants:  None  Procedures:  None  Antibiotics:  None  HPI/Subjective: Overall he feels better with the exception of right ankle pain which has worsened. He does report a history of gout. He is not on chronic therapy. He is starting to eat again.   Objective: Filed Vitals:   10/16/12 1600 10/16/12 2145 10/17/12 0228 10/17/12 0502  BP: 143/85 141/66 121/61 129/73  Pulse:  123 121 115  Temp:  99 F (37.2 C) 98.5 F (36.9 C) 98.7 F (37.1 C)  TempSrc:  Oral Oral Oral  Resp: 20 20 19 20   Height:      Weight:      SpO2:  100% 99% 100%    Intake/Output Summary (Last 24 hours) at 10/17/12 1038 Last data filed at 10/17/12 0429  Gross per 24 hour  Intake   1600 ml  Output   1100 ml  Net    500 ml     Filed Weights   10/15/12 1321 10/15/12 1803 10/16/12 0500  Weight: 111.585 kg (246 lb) 106.7 kg (235 lb 3.7 oz) 109.7 kg (241 lb 13.5 oz)    Exam:   General: Nontoxic. Appears calm and comfortable.  Cardiovascular regular rate and rhythm. No murmur, rub, gallop. No left lower extremity edema. Right lower extremity 2+ nonpitting edema right foot and ankle.  Respiratory clear to auscultation bilaterally. No wheezes, rales, rhonchi. Normal respiratory effort.  Musculoskeletal: The right ankle and foot is edematous, there is erythema over the right lateral malleolus with tenderness to palpation.  Data Reviewed:  Capillary blood sugars remain in the 200s. Leukocytosis is persistent, 19.6 today.hemoglobin A1c  15.9. Urine culture nondiagnostic.  Pending studies:   None  Scheduled Meds: . Chlorhexidine Gluconate Cloth  6 each Topical Q0600  . feeding supplement  237 mL Oral BID BM  . feeding supplement  30 mL Oral BID  . insulin aspart  0-9 Units Subcutaneous TID WC  . insulin  glargine  10 Units Subcutaneous Daily  . multivitamin with minerals  1 tablet Oral Daily  . mupirocin ointment  1 application Nasal BID  . nicotine  14 mg Transdermal Daily   Continuous Infusions:    Principal Problem:   DKA (diabetic ketoacidoses) Active Problems:   DM (diabetes mellitus) type 2, uncontrolled, with ketoacidosis   Acute gout   Obesity   Time spent 15 minutes

## 2012-10-18 ENCOUNTER — Encounter (HOSPITAL_COMMUNITY): Payer: Self-pay | Admitting: Internal Medicine

## 2012-10-18 ENCOUNTER — Inpatient Hospital Stay (HOSPITAL_COMMUNITY): Payer: BC Managed Care – PPO

## 2012-10-18 DIAGNOSIS — R600 Localized edema: Secondary | ICD-10-CM | POA: Diagnosis present

## 2012-10-18 DIAGNOSIS — R609 Edema, unspecified: Secondary | ICD-10-CM

## 2012-10-18 DIAGNOSIS — E871 Hypo-osmolality and hyponatremia: Secondary | ICD-10-CM | POA: Diagnosis present

## 2012-10-18 DIAGNOSIS — R000 Tachycardia, unspecified: Secondary | ICD-10-CM | POA: Diagnosis present

## 2012-10-18 DIAGNOSIS — R634 Abnormal weight loss: Secondary | ICD-10-CM | POA: Diagnosis present

## 2012-10-18 DIAGNOSIS — L02419 Cutaneous abscess of limb, unspecified: Secondary | ICD-10-CM

## 2012-10-18 DIAGNOSIS — L03116 Cellulitis of left lower limb: Secondary | ICD-10-CM | POA: Diagnosis present

## 2012-10-18 LAB — URIC ACID: Uric Acid, Serum: 2.9 mg/dL — ABNORMAL LOW (ref 4.0–7.8)

## 2012-10-18 LAB — CBC WITH DIFFERENTIAL/PLATELET
Basophils Relative: 0 % (ref 0–1)
Eosinophils Absolute: 0.1 10*3/uL (ref 0.0–0.7)
Eosinophils Relative: 1 % (ref 0–5)
MCH: 28.8 pg (ref 26.0–34.0)
MCHC: 34.9 g/dL (ref 30.0–36.0)
MCV: 82.5 fL (ref 78.0–100.0)
Monocytes Relative: 7 % (ref 3–12)
Neutrophils Relative %: 84 % — ABNORMAL HIGH (ref 43–77)
Platelets: 180 10*3/uL (ref 150–400)

## 2012-10-18 LAB — BASIC METABOLIC PANEL
BUN: 8 mg/dL (ref 6–23)
CO2: 26 mEq/L (ref 19–32)
Calcium: 9.2 mg/dL (ref 8.4–10.5)
GFR calc non Af Amer: >90 mL/min (ref >90)
Glucose, Bld: 261 mg/dL — ABNORMAL HIGH (ref 70–99)

## 2012-10-18 LAB — GLUCOSE, CAPILLARY
Glucose-Capillary: 243 mg/dL — ABNORMAL HIGH (ref 70–99)
Glucose-Capillary: 279 mg/dL — ABNORMAL HIGH (ref 70–99)

## 2012-10-18 LAB — SEDIMENTATION RATE: Sed Rate: 107 mm/hr — ABNORMAL HIGH (ref 0–16)

## 2012-10-18 MED ORDER — GLIPIZIDE 5 MG PO TABS
5.0000 mg | ORAL_TABLET | Freq: Every day | ORAL | Status: DC
  Administered 2012-10-18 – 2012-10-19 (×2): 5 mg via ORAL
  Filled 2012-10-18 (×2): qty 1

## 2012-10-18 MED ORDER — METHYLPREDNISOLONE SODIUM SUCC 125 MG IJ SOLR
60.0000 mg | Freq: Once | INTRAMUSCULAR | Status: AC
  Administered 2012-10-18: 60 mg via INTRAVENOUS
  Filled 2012-10-18 (×2): qty 2

## 2012-10-18 MED ORDER — METFORMIN HCL 500 MG PO TABS
500.0000 mg | ORAL_TABLET | Freq: Two times a day (BID) | ORAL | Status: DC
  Administered 2012-10-18 – 2012-10-21 (×7): 500 mg via ORAL
  Filled 2012-10-18 (×7): qty 1

## 2012-10-18 MED ORDER — VANCOMYCIN HCL 10 G IV SOLR
1500.0000 mg | Freq: Once | INTRAVENOUS | Status: AC
  Administered 2012-10-18: 1500 mg via INTRAVENOUS
  Filled 2012-10-18: qty 1500

## 2012-10-18 MED ORDER — VANCOMYCIN HCL IN DEXTROSE 1-5 GM/200ML-% IV SOLN
1000.0000 mg | Freq: Two times a day (BID) | INTRAVENOUS | Status: DC
  Administered 2012-10-18 – 2012-10-21 (×6): 1000 mg via INTRAVENOUS
  Filled 2012-10-18 (×10): qty 200

## 2012-10-18 MED ORDER — DICLOFENAC SODIUM 1 % TD GEL
4.0000 g | Freq: Three times a day (TID) | TRANSDERMAL | Status: DC
  Administered 2012-10-18 – 2012-10-21 (×9): 4 g via TOPICAL
  Filled 2012-10-18: qty 100

## 2012-10-18 MED ORDER — INSULIN GLARGINE 100 UNIT/ML ~~LOC~~ SOLN
20.0000 [IU] | Freq: Two times a day (BID) | SUBCUTANEOUS | Status: DC
  Administered 2012-10-18 – 2012-10-19 (×4): 20 [IU] via SUBCUTANEOUS
  Filled 2012-10-18 (×5): qty 0.2

## 2012-10-18 NOTE — Progress Notes (Signed)
ANTIBIOTIC CONSULT NOTE - INITIAL  Pharmacy Consult for Vancomycin Indication: cellulitis  Allergies  Allergen Reactions  . Bee Venom Swelling  . Shellfish Allergy Nausea And Vomiting    Patient Measurements: Height: 5\' 6"  (167.6 cm) Weight: 241 lb 13.5 oz (109.7 kg) IBW/kg (Calculated) : 63.8  Vital Signs: Temp: 97.8 F (36.6 C) (06/18 0553) Temp src: Oral (06/18 0553) BP: 118/57 mmHg (06/18 0553) Pulse Rate: 108 (06/18 0553) Intake/Output from previous day: 06/17 0701 - 06/18 0700 In: 960 [P.O.:960] Out: 1000 [Urine:1000] Intake/Output from this shift: Total I/O In: 600 [P.O.:600] Out: -   Labs:  Recent Labs  10/16/12 0430 10/16/12 1213 10/17/12 0516 10/18/12 0739  WBC 16.7*  --  19.6* 20.9*  HGB 13.8  --  14.0 13.0  PLT 178  --  190 180  CREATININE 0.47* 0.43*  --  0.40*   Estimated Creatinine Clearance: 125.6 ml/min (by C-G formula based on Cr of 0.4). No results found for this basename: VANCOTROUGH, Leodis Binet, VANCORANDOM, GENTTROUGH, GENTPEAK, GENTRANDOM, TOBRATROUGH, TOBRAPEAK, TOBRARND, AMIKACINPEAK, AMIKACINTROU, AMIKACIN,  in the last 72 hours   Microbiology: Recent Results (from the past 720 hour(s))  URINE CULTURE     Status: None   Collection Time    10/15/12  2:28 PM      Result Value Range Status   Specimen Description URINE, CLEAN CATCH   Final   Special Requests NONE   Final   Culture  Setup Time 10/15/2012 21:36   Final   Colony Count 20,OOO COLONIES/ML   Final   Culture     Final   Value: Multiple bacterial morphotypes present, none predominant. Suggest appropriate recollection if clinically indicated.   Report Status 10/16/2012 FINAL   Final  MRSA PCR SCREENING     Status: Abnormal   Collection Time    10/15/12  6:06 PM      Result Value Range Status   MRSA by PCR POSITIVE (*) NEGATIVE Final   Comment:            The GeneXpert MRSA Assay (FDA     approved for NASAL specimens     only), is one component of a     comprehensive  MRSA colonization     surveillance program. It is not     intended to diagnose MRSA     infection nor to guide or     monitor treatment for     MRSA infections.     RESULT CALLED TO, READ BACK BY AND VERIFIED WITH:     K. FAINT AT 2107 ON 10/15/12 BY Wynonia Lawman    Medical History: Past Medical History  Diagnosis Date  . DM (diabetes mellitus)     Medications:  Scheduled:  . Chlorhexidine Gluconate Cloth  6 each Topical Q0600  . diclofenac sodium  4 g Topical TID  . docusate sodium  100 mg Oral BID  . feeding supplement  237 mL Oral BID BM  . feeding supplement  30 mL Oral BID  . glipiZIDE  5 mg Oral QAC breakfast  . insulin aspart  0-15 Units Subcutaneous TID WC  . insulin aspart  0-5 Units Subcutaneous QHS  . insulin glargine  20 Units Subcutaneous BID  . metFORMIN  500 mg Oral BID WC  . methylPREDNISolone (SOLU-MEDROL) injection  60 mg Intravenous Once  . multivitamin with minerals  1 tablet Oral Daily  . mupirocin ointment  1 application Nasal BID  . nicotine  14 mg Transdermal Daily  .  polyethylene glycol  17 g Oral Daily  . vancomycin  1,500 mg Intravenous Once   Assessment: 52 yo obese M admitted 6/15 with DKA subsequently noted to have gout flare vs. cellulitis. Patient to start on IV Vancomycin. Renal function is at patient's baseline.   Goal of Therapy:  Vancomycin trough level 10-15 mcg/ml  Plan:  1) Vancomycin 1500mg  IV load followed by 1g IV q12h 2) Check Vancomycin trough at steady state 3) Monitor renal function and cx data   Elson Clan 10/18/2012,10:42 AM

## 2012-10-18 NOTE — Clinical Social Work Psychosocial (Signed)
Clinical Social Work Department BRIEF PSYCHOSOCIAL ASSESSMENT 10/18/2012  Patient:  Luke [name], [name]     Account Number:  [id number]     Admit date:  10/15/2012  Clinical Social Worker:  Nancie Neas  Date/Time:  10/18/2012 02:50 PM  Referred by:  CSW  Date Referred:  10/18/2012 Referred for  SNF Placement   Other Referral:   Interview type:  Patient Other interview type:   Luke [name] sister    PSYCHOSOCIAL DATA Living Status:  FAMILY Admitted from facility:   Level of care:   Primary support name:  Cobey Primary support relationship to patient:  SIBLING Degree of support available:   supportive family per pt    CURRENT CONCERNS Current Concerns  Post-Acute Placement   Other Concerns:    SOCIAL WORK ASSESSMENT / PLAN CSW met with pt at bedside. Pt alert Luke [name] oriented Luke [name] has several family members present. Agreeable to CSW completing assessment with family there. Pt reports he has been living with his brother. Family appears to be involved Luke [name] supportive. Pt works Luke [name] is generally independent with ADLs at baseline. Pt said he was diagnosed with diabetes recently. He has been feeling weak for the last few days Luke [name] experiencing pain in his right leg/foot. Pt's sister had offered for pt to come live with her after d/c. However, pt evaluated pt PT today Luke [name] found to be deconditioned. SNF was recommended. CSW discussed SNF placement process, including insurance authorization Luke [name] facilities in network per [employer]. Of these in network, pt requests [facility] if possible. He is also aware of out of pocket maximum.   Assessment/plan status:  Psychosocial Support/Ongoing Assessment of Needs Other assessment/ plan:   Information/referral to community resources:   SNF list    PATIENT'S/FAMILY'S RESPONSE TO PLAN OF CARE: Pt Luke [name] pt's sister are agreeable to plan for pt to transfer to SNF from hospital pending insurance authorization. CSW will fax out FL2 Luke [name] follow up with bed offers when  available.       Derenda Fennel, [place] [phone]

## 2012-10-18 NOTE — Progress Notes (Signed)
Subjective: The patient says that he feels "all right". He has pain of his right foot and right leg which is slightly less painful than yesterday. However, he is unable to bear much weight on it and has difficulty walking because of it. He denies diarrhea, nausea, vomiting, chest congestion, or pain with urination.  Objective: Vital signs in last 24 hours: Filed Vitals:   10/17/12 1851 10/17/12 2049 10/18/12 0152 10/18/12 0553  BP: 128/76 109/65 115/76 118/57  Pulse: 127 118 112 108  Temp: 99.6 F (37.6 C) 99.2 F (37.3 C) 98.7 F (37.1 C) 97.8 F (36.6 C)  TempSrc:  Oral Oral Oral  Resp: 20 20 18 20   Height:      Weight:      SpO2: 97% 94% 95% 99%    Intake/Output Summary (Last 24 hours) at 10/18/12 1232 Last data filed at 10/18/12 1308  Gross per 24 hour  Intake   1320 ml  Output   1000 ml  Net    320 ml    Weight change:   Physical exam: General: Obese 52 year old African-American man sitting up in bed, in no acute distress. Lungs: Clear anteriorly with decreased breath sounds in the bases. Heart: S1, S2, with tachycardia. Abdomen: Obese, positive bowel sounds, soft, nontender, nondistended. Extremities: Right lower extremity with 1-2+ nonpitting edema and mild erythema above the ankle laterally and with mild to moderate global tenderness to right foot. No edema or erythema of the left lower extremity. Pulses barely palpable bilaterally. Neurologic: He is alert and oriented x3. Cranial nerves II through XII are intact. He is barely able to lift both legs against gravity in the sitting position in bed. Sensation is grossly intact.  Lab Results: Basic Metabolic Panel:  Recent Labs  65/78/46 1213 10/18/12 0739  NA 132* 132*  K 3.5 3.8  CL 99 95*  CO2 21 26  GLUCOSE 203* 261*  BUN 4* 8  CREATININE 0.43* 0.40*  CALCIUM 9.3 9.2   Liver Function Tests: No results found for this basename: AST, ALT, ALKPHOS, BILITOT, PROT, ALBUMIN,  in the last 72 hours No results  found for this basename: LIPASE, AMYLASE,  in the last 72 hours No results found for this basename: AMMONIA,  in the last 72 hours CBC:  Recent Labs  10/15/12 1520  10/17/12 0516 10/18/12 0739  WBC 16.4*  < > 19.6* 20.9*  NEUTROABS 14.0*  --   --  17.5*  HGB 15.1  < > 14.0 13.0  HCT 43.3  < > 40.3 37.3*  MCV 83.9  < > 82.9 82.5  PLT 188  < > 190 180  < > = values in this interval not displayed. Cardiac Enzymes:  Recent Labs  10/15/12 1520  TROPONINI <0.30   BNP: No results found for this basename: PROBNP,  in the last 72 hours D-Dimer:  Recent Labs  10/15/12 1515  DDIMER 1.19*   CBG:  Recent Labs  10/17/12 0722 10/17/12 1117 10/17/12 1628 10/17/12 2140 10/18/12 0709 10/18/12 1113  GLUCAP 264* 318* 310* 350* 243* 279*   Hemoglobin A1C:  Recent Labs  10/16/12 0430  HGBA1C 15.9*   Fasting Lipid Panel: No results found for this basename: CHOL, HDL, LDLCALC, TRIG, CHOLHDL, LDLDIRECT,  in the last 72 hours Thyroid Function Tests: No results found for this basename: TSH, T4TOTAL, FREET4, T3FREE, THYROIDAB,  in the last 72 hours Anemia Panel: No results found for this basename: VITAMINB12, FOLATE, FERRITIN, TIBC, IRON, RETICCTPCT,  in the last 72  hours Coagulation: No results found for this basename: LABPROT, INR,  in the last 72 hours Urine Drug Screen: Drugs of Abuse  No results found for this basename: labopia,  cocainscrnur,  labbenz,  amphetmu,  thcu,  labbarb    Alcohol Level: No results found for this basename: ETH,  in the last 72 hours Urinalysis:  Recent Labs  10/15/12 1428  COLORURINE YELLOW  LABSPEC >1.030*  PHURINE 6.0  GLUCOSEU 500*  HGBUR TRACE*  BILIRUBINUR MODERATE*  KETONESUR >80*  PROTEINUR 30*  UROBILINOGEN 1.0  NITRITE NEGATIVE  LEUKOCYTESUR NEGATIVE   Misc. Labs:   Micro: Recent Results (from the past 240 hour(s))  URINE CULTURE     Status: None   Collection Time    10/15/12  2:28 PM      Result Value Range Status    Specimen Description URINE, CLEAN CATCH   Final   Special Requests NONE   Final   Culture  Setup Time 10/15/2012 21:36   Final   Colony Count 20,OOO COLONIES/ML   Final   Culture     Final   Value: Multiple bacterial morphotypes present, none predominant. Suggest appropriate recollection if clinically indicated.   Report Status 10/16/2012 FINAL   Final  MRSA PCR SCREENING     Status: Abnormal   Collection Time    10/15/12  6:06 PM      Result Value Range Status   MRSA by PCR POSITIVE (*) NEGATIVE Final   Comment:            The GeneXpert MRSA Assay (FDA     approved for NASAL specimens     only), is one component of a     comprehensive MRSA colonization     surveillance program. It is not     intended to diagnose MRSA     infection nor to guide or     monitor treatment for     MRSA infections.     RESULT CALLED TO, READ BACK BY AND VERIFIED WITH:     K. FAINT AT 2107 ON 10/15/12 BY Wynonia Lawman    Studies/Results: No results found.  Medications:  Scheduled: . Chlorhexidine Gluconate Cloth  6 each Topical Q0600  . diclofenac sodium  4 g Topical TID  . docusate sodium  100 mg Oral BID  . feeding supplement  237 mL Oral BID BM  . feeding supplement  30 mL Oral BID  . glipiZIDE  5 mg Oral QAC breakfast  . insulin aspart  0-15 Units Subcutaneous TID WC  . insulin aspart  0-5 Units Subcutaneous QHS  . insulin glargine  20 Units Subcutaneous BID  . metFORMIN  500 mg Oral BID WC  . multivitamin with minerals  1 tablet Oral Daily  . mupirocin ointment  1 application Nasal BID  . nicotine  14 mg Transdermal Daily  . polyethylene glycol  17 g Oral Daily  . vancomycin  1,500 mg Intravenous Once  . vancomycin  1,000 mg Intravenous Q12H   Continuous:  OZH:YQMVHQIONGEXB, dextrose, HYDROcodone-acetaminophen, morphine injection, ondansetron (ZOFRAN) IV  Assessment: Active Problems:   Leg edema, right   Cellulitis of left lower extremity   Acute gout   Obesity   Sinus  tachycardia   Unintentional weight loss   Hyponatremia   1. Recently diagnosed type 2 diabetes with DKA on admission. Hemoglobin A1c 15.9. Status post insulin drip. The patient's blood glucose is better, but not ideal. I have added glipizide and metformin this morning. Continue  sliding scale NovoLog and Lantus with adjustments as needed. Continue diabetes education. Hopefully he'll be able to attend a free diabetes classes as an outpatient.  Unintentional weight loss as reported in the setting of obesity. It is likely that his weight loss was from uncontrolled diabetes (hemoglobin A1c 15.9). The registered dietitian's recommendations noted and appreciated.  Leukocytosis. His white blood cell count is increasing, without fever. It is presumed that his leukocytosis is secondary to an acute gout flare. His urinalysis was unremarkable for infection in his CT of the chest was unremarkable for infection.  Left foot pain and shoulder pain, presumably secondary to gout. Naproxen has helped a little. I favor giving him one dose of Solu-Medrol even in light of elevated glucose to see if it will help with decreasing pain and inflammation.  Left lower extremity cellulitis. Given his leukocytosis, will start vancomycin empirically.  Hyponatremia. Likely multifactorial including pseudo-hyponatremia from hyperglycemia and dehydration. It appears he has been amply hydrated. Will follow.  Sinus tachycardia. We'll order TSH and free T4 for further evaluation.  Gait disorder/deconditioning. Physical therapy recommendations noted and appreciated. We'll pursue skilled nursing facility placement. Next    Plan:  1. Solu-Medrol ordered x1. 2. In light of steroids, will increase Lantus to 20 units twice a day. Have already added glipizide and metformin. 3. We'll check a sedimentation rate and uric acid level. 4. For tachycardia, we will order TSH and free T4. 5. We'll order right lower extremity ultrasound to  rule out DVT. 6. Have already started vancomycin empirically for cellulitis of the right leg. 7. When medically ready, we'll pursue short-term skilled nursing facility placement for rehabilitation.   LOS: 3 days   Allen Egerton 10/18/2012, 12:32 PM

## 2012-10-18 NOTE — Evaluation (Signed)
Occupational Therapy Evaluation Patient Details Name: Luke [name] MRN: [id number] DOB: Sep 21, 1960 Today's Date: 10/18/2012 Time: 1500-1530 OT Time Calculation (min): 30 min  OT Assessment / Plan / Recommendation Clinical Impression  Patient is a 52 y/o male s/p left shoulder impingement presenting to acute OT with deficits below. Patient is experiencing decreased joint mobility and increased pain with is limiting his participation in daily acitivities. Recommend pt receive OT while at SNF for shoulder impingement and if problem still persists afterwards I recommend outpatient OT.    OT Assessment  Patient needs continued OT Services    Follow Up Recommendations  SNF;Outpatient OT    Barriers to Discharge None    Equipment Recommendations  None recommended by OT       Frequency  Min 2X/week    Precautions / Restrictions Precautions Precautions: Fall Restrictions Weight Bearing Restrictions: Yes RLE Weight Bearing: Weight bearing as tolerated   Pertinent Vitals/Pain 3/10 left shoulder       OT Diagnosis: Generalized weakness;Acute pain  OT Problem List: Decreased strength;Pain;Decreased range of motion;Impaired UE functional use OT Treatment Interventions: Self-care/ADL training;Therapeutic activities;Therapeutic exercise;Manual therapy;Modalities;Patient/family education   OT Goals Acute Rehab OT Goals OT Goal Formulation: With patient Time For Goal Achievement: 11/01/12 Potential to Achieve Goals: Good Arm Goals Additional Arm Goal #1: Patient will be educated on HEP Arm Goal: Additional Goal #1 - Progress: Goal set today Additional Arm Goal #2: Patient will decrease pain level to 1/10 to be able to perform work tasks with less difficulty. Arm Goal: Additional Goal #2 - Progress: Goal set today Miscellaneous OT Goals Miscellaneous OT Goal #1: Patient will increase PROM to WNL to be able to get dressed with less difficulty.  OT Goal: Miscellaneous Goal #1 - Progress:  Goal set today  Visit Information  Last OT Received On: 10/18/12    Subjective Data  Subjective: S: My shoulder is starting to feel better.  Patient Stated Goal: To go home.    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 90 degrees, ABD: approx. 50 degrees, IR: 90 degrees, ER: 30 degrees LUE Sensation: WFL - Light Touch LUE Coordination: WFL - gross/fine motor        Exercise Shoulder Exercises Shoulder Flexion: PROM;Left;10 reps;Supine Shoulder ABduction: PROM;Left;10 reps;Supine Shoulder External Rotation: PROM;Left;10 reps;Supine Other Exercises Other Exercises: MFR and manual stretching to left shoulder to increase PROM to [facility] in a pain free zone.  Other Exercises: Shoulder IR supine left 10X      End of Session OT - End of Session Activity Tolerance: Patient tolerated treatment  well Patient left: in bed;with call bell/phone  within reach Nurse Communication: Other (comment) (IV site coming out.)    Limmie Patricia, OTR/L,CBIS   10/18/2012, 3:39 PM

## 2012-10-18 NOTE — Evaluation (Signed)
Physical Therapy Evaluation Patient Details Name: Luke [name] MRN: [id number] DOB: 1961-03-29 Today's Date: 10/18/2012 Time: 4098-1191 PT Time Calculation (min): 37 min  PT Assessment / Plan / Recommendation Clinical Impression  Pt was seen for initial eva/tx.  He is very pleasant and cooperative, works full time on an Theatre stage manager.  He lives with brother who is employed full time.  Pt is normally fully independent with all ADLs but he describes developing generalized weakness over recent time which he feels is related to poor nutrition.  He reports a 100# weight loss over the past year due to a significant intake of food, primarily because of inability to eat anything but a few vegetables and fruit.  He now has a painful ankle and shoulder compounding weakness.  He is significantly deconditioned with weakness throughout.  Transfer OOB is very slow and labored, even with HOB elevated.  He was only able to ambulate 5' with  a walker.  I am recommending SNF at d/c and pt is agreeable to this.    PT Assessment  Patient needs continued PT services    Follow Up Recommendations  SNF    Does the patient have the potential to tolerate intense rehabilitation      Barriers to Discharge Inaccessible home environment;Decreased caregiver support 3 steps into home, alone during the day    Equipment Recommendations  Rolling walker with 5" wheels    Recommendations for Other Services OT consult   Frequency Min 3X/week    Precautions / Restrictions Precautions Precautions: Fall Restrictions RLE Weight Bearing: Weight bearing as tolerated   Pertinent Vitals/Pain       Mobility  Bed Mobility Bed Mobility: Supine to Sit Supine to Sit: 3: Mod assist;HOB elevated Details for Bed Mobility Assistance: transfer is extremely labored and limited by minimal use of LUE (because of pain) and generalized weakness Transfers Transfers: Sit to Stand;Stand to Sit Sit to Stand: 4: Min assist;With upper  extremity assist;From bed Stand to Sit: 4: Min assist;With upper extremity assist;To chair/3-in-1 Ambulation/Gait Ambulation/Gait Assistance: 4: Min guard Ambulation Distance (Feet): 5 Feet Assistive device: Rolling walker Gait Pattern: Trunk flexed;Decreased step length - left;Decreased stance time - right Gait velocity: gait is extremely slow and labored, he c/o 10/10 pain in R ankle with weight bearing General Gait Details: pt has difficulty off loading weight on right foot during gait due to generalized weakness, pain in left shoulder and morbid obesity Stairs: No Wheelchair Mobility Wheelchair Mobility: No    Exercises     PT Diagnosis: Difficulty walking;Abnormality of gait;Generalized weakness;Acute pain  PT Problem List: Decreased strength;Decreased range of motion;Decreased activity tolerance;Decreased mobility;Decreased knowledge of use of DME;Obesity;Pain PT Treatment Interventions: Gait training;Functional mobility training;Stair training;Therapeutic activities;Therapeutic exercise;Patient/family education   PT Goals Acute Rehab PT Goals PT Goal Formulation: With patient Time For Goal Achievement: 11/01/12 Potential to Achieve Goals: Good Pt will go Supine/Side to Sit: with min assist;with HOB 0 degrees PT Goal: Supine/Side to Sit - Progress: Goal set today Pt will go Sit to Supine/Side: with supervision;with HOB 0 degrees PT Goal: Sit to Supine/Side - Progress: Goal set today Pt will go Sit to Stand: with supervision;with upper extremity assist PT Goal: Sit to Stand - Progress: Goal set today Pt will go Stand to Sit: with supervision;with upper extremity assist PT Goal: Stand to Sit - Progress: Goal set today Pt will Ambulate: 16 - 50 feet;with supervision;with rolling walker PT Goal: Ambulate - Progress: Goal set today Pt will Go Up /  Down Stairs: 3-5 stairs;with min assist;with rail(s) PT Goal: Up/Down Stairs - Progress: Goal set today  Visit Information  Last PT  Received On: 10/18/12    Subjective Data  Subjective: I feel very weak Patient Stated Goal: get back to work   Prior Functioning  Home Living Lives With: Family Available Help at Discharge: Available PRN/intermittently Type of Home: House Home Access: Stairs to enter Secretary/administrator of Steps: 3 Entrance Stairs-Rails: Right Home Layout: One level Bathroom Shower/Tub: Engineer, manufacturing systems: Standard Home Adaptive Equipment: None Prior Function Level of Independence: Independent Able to Take Stairs?: Yes Driving: Yes 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, advertising Arousal/Alertness: Awake/alert Behavior During Therapy: WFL for tasks assessed/performed Overall Cognitive Status: Within Functional Limits for tasks assessed    Extremity/Trunk Assessment Right Upper Extremity Assessment RUE ROM/Strength/Tone: Within functional levels RUE Sensation: WFL - Light Touch RUE Coordination: WFL - gross motor Left Upper Extremity Assessment LUE ROM/Strength/Tone: Deficits LUE ROM/Strength/Tone Deficits: painful shoulder with movement so unable to eval strength...has 90 degrees AA shoulder flexion and abduction LUE Sensation: WFL - Light Touch Right Lower Extremity Assessment RLE ROM/Strength/Tone: Deficits RLE ROM/Strength/Tone Deficits: strength 3-/5 at hip and knee, able to move ankle independently and it has ROM to WNL.Marland KitchenMarland Kitchenpain in ankle only with weight bearing RLE Sensation: WFL - Light Touch Left Lower Extremity Assessment LLE ROM/Strength/Tone: Deficits LLE ROM/Strength/Tone Deficits: strength 3-/5 throughout, ROM is WNL LLE Sensation: WFL - Light Touch Trunk Assessment Trunk Assessment: Normal   Balance Balance Balance Assessed: Yes Static Standing Balance Static Standing - Balance Support: Bilateral upper extremity supported Static Standing - Level of Assistance: 5:  Stand by assistance  End of Session PT - End of Session Equipment Utilized During Treatment: Gait belt Activity Tolerance: Patient limited by pain;Patient limited by fatigue Patient left: in chair;with call bell/phone within reach  GP     Konrad Penta 10/18/2012, 10:49 AM

## 2012-10-18 NOTE — Clinical Social Work Placement (Signed)
Clinical Social Work Department CLINICAL SOCIAL WORK PLACEMENT NOTE 10/18/2012  Patient:  Luke [name], Luke [name]  Account Number:  [id number] Admit date:  10/15/2012  Clinical Social Worker:  Derenda Fennel, LCSW  Date/time:  10/18/2012 02:50 PM  Clinical Social Work is seeking post-discharge placement for this patient at the following level of care:   SKILLED NURSING   (*CSW will update this form in Epic as items are completed)   10/18/2012  Patient/family provided with [facility] Department of Clinical Social Work's list of facilities offering this level of care within the geographic area requested by the patient (or if unable, by the patient's family).  10/18/2012  Patient/family informed of their freedom to choose among providers that offer the needed level of care, that participate in Medicare, Medicaid or managed care program needed by the patient, have an available bed and are willing to accept the patient.  10/18/2012  Patient/family informed of [facility]' ownership interest in [facility], as well as of the fact that they are under no obligation to receive care at this facility.  PASARR submitted to EDS on 10/18/2012 PASARR number received from EDS on 10/18/2012  FL2 transmitted to all facilities in geographic area requested by pt/family on  10/18/2012 FL2 transmitted to all facilities within larger geographic area on   Patient informed that his/her managed care company has contracts with or will negotiate with  certain facilities, including the following:     Patient/family informed of bed offers received:   Patient chooses bed at  Physician recommends and patient chooses bed at    Patient to be transferred to  on   Patient to be transferred to facility by   The following physician request were entered in Epic:   Additional Comments: BCBS: pt aware of facilities in network  [place], [place] [phone]

## 2012-10-18 NOTE — Progress Notes (Signed)
UR Chart Review Completed  

## 2012-10-19 ENCOUNTER — Inpatient Hospital Stay (HOSPITAL_COMMUNITY): Payer: BC Managed Care – PPO

## 2012-10-19 DIAGNOSIS — M436 Torticollis: Secondary | ICD-10-CM | POA: Diagnosis present

## 2012-10-19 DIAGNOSIS — R29898 Other symptoms and signs involving the musculoskeletal system: Secondary | ICD-10-CM

## 2012-10-19 DIAGNOSIS — L03115 Cellulitis of right lower limb: Secondary | ICD-10-CM | POA: Diagnosis present

## 2012-10-19 DIAGNOSIS — M542 Cervicalgia: Secondary | ICD-10-CM | POA: Diagnosis present

## 2012-10-19 LAB — CBC
HCT: 39.7 % (ref 39.0–52.0)
Hemoglobin: 13.8 g/dL (ref 13.0–17.0)
MCHC: 34.8 g/dL (ref 30.0–36.0)
RBC: 4.83 MIL/uL (ref 4.22–5.81)

## 2012-10-19 LAB — BASIC METABOLIC PANEL
BUN: 10 mg/dL (ref 6–23)
Calcium: 9 mg/dL (ref 8.4–10.5)
Creatinine, Ser: 0.4 mg/dL — ABNORMAL LOW (ref 0.50–1.35)
GFR calc non Af Amer: >90 mL/min (ref >90)
Glucose, Bld: 303 mg/dL — ABNORMAL HIGH (ref 70–99)

## 2012-10-19 LAB — GLUCOSE, CAPILLARY

## 2012-10-19 LAB — CK: Total CK: 13 U/L (ref 7–232)

## 2012-10-19 MED ORDER — NAPROXEN 250 MG PO TABS
375.0000 mg | ORAL_TABLET | Freq: Two times a day (BID) | ORAL | Status: DC
  Administered 2012-10-19: 375 mg via ORAL
  Filled 2012-10-19: qty 2

## 2012-10-19 MED ORDER — VANCOMYCIN HCL IN DEXTROSE 1-5 GM/200ML-% IV SOLN
INTRAVENOUS | Status: AC
  Filled 2012-10-19: qty 200

## 2012-10-19 MED ORDER — IOHEXOL 300 MG/ML  SOLN
50.0000 mL | Freq: Once | INTRAMUSCULAR | Status: AC | PRN
  Administered 2012-10-19: 50 mL via ORAL

## 2012-10-19 NOTE — Progress Notes (Signed)
Inpatient Diabetes Program Recommendations  AACE/ADA: New Consensus Statement on Inpatient Glycemic Control (2013)  Target Ranges:  Prepandial:   less than 140 mg/dL      Peak postprandial:   less than 180 mg/dL (1-2 hours)      Critically ill patients:  140 - 180 mg/dL   Reason for Visit: Hyperglycemia  Blood sugars continue to be elevated.  Pt c/o nausea and vomiting this am per MD note.    Results for DYER, KLUG (MRN [id number]) as of 10/19/2012 14:46  Ref. Range 10/18/2012 16:34 10/18/2012 21:08 10/19/2012 07:08 10/19/2012 11:27  Glucose-Capillary Latest Range: 70-99 mg/dL 409 (H) 811 (H) 914 (H) 272 (H)  Results for KONOR, NOREN (MRN [id number]) as of 10/19/2012 14:46  Ref. Range 10/16/2012 04:30  Hemoglobin A1C Latest Range: <5.7 % 15.9 (H)    Inpatient Diabetes Program Recommendations Insulin - Basal: May need increase in basal insulin, but would adjust bolus first. Insulin - Meal Coverage: Add meal coverage insulin - Novolog 6 units tidwc Oral Agents: Titrate metformin to 1000 mg bid in 1 week if tolerated  Note: Encourage pt to attend OP Diabetes Education at [facility] with [employer].

## 2012-10-19 NOTE — Clinical Social Work Note (Signed)
CSW presented bed offers and pt chooses [facility]. Facility notified and have initiated pre cert request. CSW to continue to follow.   Derenda Fennel, [place] [phone]

## 2012-10-19 NOTE — Clinical Social Work Placement (Signed)
Clinical Social Work Department CLINICAL SOCIAL WORK PLACEMENT NOTE 10/19/2012  Patient:  Luke [name], Luke [name]  Account Number:  [id number] Admit date:  10/15/2012  Clinical Social Worker:  Derenda Fennel, LCSW  Date/time:  10/18/2012 02:50 PM  Clinical Social Work is seeking post-discharge placement for this patient at the following level of care:   SKILLED NURSING   (*CSW will update this form in Epic as items are completed)   10/18/2012  Patient/family provided with [facility] Department of Clinical Social Work's list of facilities offering this level of care within the geographic area requested by the patient (or if unable, by the patient's family).  10/18/2012  Patient/family informed of their freedom to choose among providers that offer the needed level of care, that participate in Medicare, Medicaid or managed care program needed by the patient, have an available bed and are willing to accept the patient.  10/18/2012  Patient/family informed of [facility]' ownership interest in [facility], as well as of the fact that they are under no obligation to receive care at this facility.  PASARR submitted to EDS on 10/18/2012 PASARR number received from EDS on 10/18/2012  FL2 transmitted to all facilities in geographic area requested by pt/family on  10/18/2012 FL2 transmitted to all facilities within larger geographic area on   Patient informed that his/her managed care company has contracts with or will negotiate with  certain facilities, including the following:     Patient/family informed of bed offers received:  10/19/2012 Patient chooses bed at [facility] Physician recommends and patient chooses bed at  [facility] [place]  Patient to be transferred to  on   Patient to be transferred to facility by   The following physician request were entered in Epic:   Additional Comments: BCBS: pt aware of facilities in network  [place], [place] [phone]

## 2012-10-19 NOTE — Progress Notes (Signed)
Subjective: The patient had an episode of nausea and vomiting this morning shortly following breakfast. He attributes it to eating the eggs that did not agree with him. He denies abdominal pain. He continues to have pain in his shoulders and his right foot, but he particularly complains about stiffness and pain in his neck. He denies subjective fever and chills. He denies sensitivity to lights.  Objective: Vital signs in last 24 hours: Filed Vitals:   10/18/12 1307 10/19/12 0230 10/19/12 0544 10/19/12 0937  BP: 129/90 130/89 129/83 115/78  Pulse: 116 127 99 106  Temp: 98.5 F (36.9 C) 98.6 F (37 C) 98.1 F (36.7 C) 97.7 F (36.5 C)  TempSrc: Oral Oral Oral   Resp: 20 20 20 20   Height:      Weight:      SpO2: 96% 98% 99% 97%    Intake/Output Summary (Last 24 hours) at 10/19/12 1303 Last data filed at 10/19/12 1238  Gross per 24 hour  Intake    680 ml  Output   1250 ml  Net   -570 ml    Weight change:   Physical exam: General: Obese 52 year old African-American man sitting up in bed, in no acute distress. Neck: Very limited range of motion with flexion and extension and lateral rotation. Mild to moderate tenderness over bilateral area of neck and over cervical spine. No palpable erythema or edema. Lungs: Clear anteriorly with decreased breath sounds in the bases. Heart: S1, S2, with less tachycardia compared to yesterday. Abdomen: Obese, positive bowel sounds, soft, nontender, nondistended. Extremities: Right lower extremity with 1-2+ nonpitting edema and mild erythema above the ankle laterally and with mild to moderate global tenderness to right foot. No edema or erythema of the left lower extremity. Pulses barely palpable bilaterally. Neurologic: He is able to lift his distal arms, but is unable to lift his proximal arms no more than 10. He is able to lift both legs against gravity. He has good hand grip. Sensation is grossly intact of his upper and lower extremities. Globally  decreased sensation of his plantar surfaces bilaterally. He is alert and oriented x3. Cranial nerves II through XII are intact.  Lab Results: Basic Metabolic Panel:  Recent Labs  56/21/30 0739 10/19/12 0542  NA 132* 134*  K 3.8 3.7  CL 95* 95*  CO2 26 27  GLUCOSE 261* 303*  BUN 8 10  CREATININE 0.40* 0.40*  CALCIUM 9.2 9.0   Liver Function Tests: No results found for this basename: AST, ALT, ALKPHOS, BILITOT, PROT, ALBUMIN,  in the last 72 hours No results found for this basename: LIPASE, AMYLASE,  in the last 72 hours No results found for this basename: AMMONIA,  in the last 72 hours CBC:  Recent Labs  10/18/12 0739 10/19/12 0542  WBC 20.9* 23.1*  NEUTROABS 17.5*  --   HGB 13.0 13.8  HCT 37.3* 39.7  MCV 82.5 82.2  PLT 180 201   Cardiac Enzymes: No results found for this basename: CKTOTAL, CKMB, CKMBINDEX, TROPONINI,  in the last 72 hours BNP: No results found for this basename: PROBNP,  in the last 72 hours D-Dimer: No results found for this basename: DDIMER,  in the last 72 hours CBG:  Recent Labs  10/18/12 0709 10/18/12 1113 10/18/12 1634 10/18/12 2108 10/19/12 0708 10/19/12 1127  GLUCAP 243* 279* 334* 354* 339* 272*   Hemoglobin A1C: No results found for this basename: HGBA1C,  in the last 72 hours Fasting Lipid Panel: No results found for  this basename: CHOL, HDL, LDLCALC, TRIG, CHOLHDL, LDLDIRECT,  in the last 72 hours Thyroid Function Tests:  Recent Labs  10/18/12 1254  TSH 0.852  FREET4 1.46   Anemia Panel: No results found for this basename: VITAMINB12, FOLATE, FERRITIN, TIBC, IRON, RETICCTPCT,  in the last 72 hours Coagulation: No results found for this basename: LABPROT, INR,  in the last 72 hours Urine Drug Screen: Drugs of Abuse  No results found for this basename: labopia,  cocainscrnur,  labbenz,  amphetmu,  thcu,  labbarb    Alcohol Level: No results found for this basename: ETH,  in the last 72 hours Urinalysis: No results  found for this basename: COLORURINE, APPERANCEUR, LABSPEC, PHURINE, GLUCOSEU, HGBUR, BILIRUBINUR, KETONESUR, PROTEINUR, UROBILINOGEN, NITRITE, LEUKOCYTESUR,  in the last 72 hours Misc. Labs:   Micro: Recent Results (from the past 240 hour(s))  URINE CULTURE     Status: None   Collection Time    10/15/12  2:28 PM      Result Value Range Status   Specimen Description URINE, CLEAN CATCH   Final   Special Requests NONE   Final   Culture  Setup Time 10/15/2012 21:36   Final   Colony Count 20,OOO COLONIES/ML   Final   Culture     Final   Value: Multiple bacterial morphotypes present, none predominant. Suggest appropriate recollection if clinically indicated.   Report Status 10/16/2012 FINAL   Final  MRSA PCR SCREENING     Status: Abnormal   Collection Time    10/15/12  6:06 PM      Result Value Range Status   MRSA by PCR POSITIVE (*) NEGATIVE Final   Comment:            The GeneXpert MRSA Assay (FDA     approved for NASAL specimens     only), is one component of a     comprehensive MRSA colonization     surveillance program. It is not     intended to diagnose MRSA     infection nor to guide or     monitor treatment for     MRSA infections.     RESULT CALLED TO, READ BACK BY AND VERIFIED WITH:     K. FAINT AT 2107 ON 10/15/12 BY Wynonia Lawman    Studies/Results: US Venous Img Lower Unilateral Right  10/18/2012   *RADIOLOGY REPORT*  Clinical Data: Right lower extremity pain and edema.  RIGHT LOWER EXTREMITY VENOUS DUPLEX ULTRASOUND  Technique:  Gray-scale sonography with graded compression, as well as color Doppler and duplex ultrasound were performed to evaluate the deep venous system of the lower extremity from the level of the common femoral vein through the popliteal and proximal calf veins. Spectral Doppler was utilized to evaluate flow at rest and with distal augmentation maneuvers.  Comparison:  None.  Findings:  Normal compressibility of the common femoral, superficial femoral,  and popliteal veins is demonstrated, as well as the visualized proximal calf veins.  No filling defects to suggest DVT on grayscale or color Doppler imaging.  Doppler waveforms show normal direction of venous flow, normal respiratory phasicity and response to augmentation.  No evidence of superficial thrombophlebitis or abnormal fluid collection.  IMPRESSION: No evidence of right lower extremity deep vein thrombosis.   Original Report Authenticated By: Irish Lack, M.D.    Medications:  Scheduled: . Chlorhexidine Gluconate Cloth  6 each Topical Q0600  . diclofenac sodium  4 g Topical TID  . docusate sodium  100  mg Oral BID  . feeding supplement  237 mL Oral BID BM  . feeding supplement  30 mL Oral BID  . glipiZIDE  5 mg Oral QAC breakfast  . insulin aspart  0-15 Units Subcutaneous TID WC  . insulin aspart  0-5 Units Subcutaneous QHS  . insulin glargine  20 Units Subcutaneous BID  . metFORMIN  500 mg Oral BID WC  . multivitamin with minerals  1 tablet Oral Daily  . mupirocin ointment  1 application Nasal BID  . nicotine  14 mg Transdermal Daily  . polyethylene glycol  17 g Oral Daily  . vancomycin  1,000 mg Intravenous Q12H   Continuous:  AVW:[id number], dextrose, HYDROcodone-acetaminophen, morphine injection, ondansetron (ZOFRAN) IV  Assessment: Active Problems:   Leg edema, right   Cellulitis of left lower extremity   Acute gout   Obesity   Sinus tachycardia   Unintentional weight loss   Hyponatremia   Neck stiffness   Neck pain, bilateral   Bilateral arm weakness   1. Recently diagnosed type 2 diabetes with DKA on admission. Hemoglobin A1c 15.9. Status post insulin drip. The patient's blood glucose is better, but not ideal.  Continue sliding scale NovoLog and Lantus with adjustments as needed. Continue diabetes education. Hopefully he'll be able to attend a free diabetes classes as an outpatient.  Neck stiffness/neck pain/proximal arm weakness. These findings are  concerning, particularly in the setting of leukocytosis. He does not believe he would be able to tolerate laying flat for any significant amount of time, and therefore, MRI may not be ideal for him. But will pursue CT of the cervical spine.  Unintentional weight loss as reported in the setting of obesity. It is likely that his weight loss was from uncontrolled diabetes (hemoglobin A1c 15.9). The registered dietitian's recommendations noted and appreciated.  Leukocytosis. His white blood cell count is increasing, without fever. It is presumed that his leukocytosis is secondary to an acute gout flare, but I think it could be from something else undiagnosed.Marland Kitchen His urinalysis was unremarkable for infection in his CT of the chest was unremarkable for infection.  Right foot pain and shoulder pain, presumably secondary to gout. His uric acid level is actually low, but his sed rate is elevated.. Naproxen has helped a little, but I gave him one dose of Solu-Medrol which apparently has not helped very much. Lower extremity ultrasound of his right leg reveals no DVT.  Right lower extremity cellulitis. Given his leukocytosis, vancomycin was started empirically on 10/18/2012.Marland Kitchen  Hyponatremia. Likely multifactorial including pseudo-hyponatremia from hyperglycemia and dehydration. It appears he has been amply hydrated. Will follow.  Sinus tachycardia. His TSH and free T4 are within normal limits. His heart rate is normalizing.  Nausea and vomiting times one episode. Will treat symptomatically and follow.  Gait disorder/deconditioning. Physical therapy recommendations noted and appreciated. We'll pursue skilled nursing facility placement.    Plan: 1. Will check a total CK. 2. We'll order a CT of his cervical spine and a CT of his abdomen and pelvis with oral contrast only for further evaluation of his leukocytosis. 3. When medically ready, we'll pursue short-term skilled nursing facility placement for  rehabilitation.   LOS: 4 days   Kaelee Pfeffer 10/19/2012, 1:03 PM

## 2012-10-19 NOTE — Progress Notes (Signed)
PT Cancellation Note  Patient Details Name: Luke [name] MRN: [id number] DOB: October 27, 1960   Cancelled Treatment:    Reason Eval/Treat Not Completed: Pain limiting ability to participate Pt up in chair and c/o pain "all over".  Doesn't feel up to any PT this afternoon.  Will try again tomorrow.  Myrlene Broker L 10/19/2012, 3:47 PM

## 2012-10-20 ENCOUNTER — Inpatient Hospital Stay (HOSPITAL_COMMUNITY): Payer: BC Managed Care – PPO

## 2012-10-20 ENCOUNTER — Encounter (HOSPITAL_COMMUNITY): Payer: Self-pay | Admitting: Internal Medicine

## 2012-10-20 DIAGNOSIS — K4090 Unilateral inguinal hernia, without obstruction or gangrene, not specified as recurrent: Secondary | ICD-10-CM

## 2012-10-20 DIAGNOSIS — M7540 Impingement syndrome of unspecified shoulder: Secondary | ICD-10-CM | POA: Diagnosis present

## 2012-10-20 DIAGNOSIS — K8020 Calculus of gallbladder without cholecystitis without obstruction: Secondary | ICD-10-CM

## 2012-10-20 DIAGNOSIS — M4802 Spinal stenosis, cervical region: Secondary | ICD-10-CM | POA: Diagnosis present

## 2012-10-20 DIAGNOSIS — E43 Unspecified severe protein-calorie malnutrition: Secondary | ICD-10-CM

## 2012-10-20 HISTORY — DX: Unilateral inguinal hernia, without obstruction or gangrene, not specified as recurrent: K40.90

## 2012-10-20 HISTORY — DX: Calculus of gallbladder without cholecystitis without obstruction: K80.20

## 2012-10-20 HISTORY — DX: Spinal stenosis, cervical region: M48.02

## 2012-10-20 HISTORY — DX: Unspecified severe protein-calorie malnutrition: E43

## 2012-10-20 LAB — HEPATIC FUNCTION PANEL
AST: 15 U/L (ref 0–37)
Albumin: 1.8 g/dL — ABNORMAL LOW (ref 3.5–5.2)
Bilirubin, Direct: 0.1 mg/dL (ref 0.0–0.3)

## 2012-10-20 LAB — VANCOMYCIN, TROUGH: Vancomycin Tr: 9.5 ug/mL — ABNORMAL LOW (ref 10.0–20.0)

## 2012-10-20 LAB — LIPASE, BLOOD: Lipase: 33 U/L (ref 11–59)

## 2012-10-20 LAB — GLUCOSE, CAPILLARY
Glucose-Capillary: 136 mg/dL — ABNORMAL HIGH (ref 70–99)
Glucose-Capillary: 332 mg/dL — ABNORMAL HIGH (ref 70–99)

## 2012-10-20 LAB — CBC
MCH: 29 pg (ref 26.0–34.0)
Platelets: 235 10*3/uL (ref 150–400)
RBC: 4.72 MIL/uL (ref 4.22–5.81)
WBC: 19.7 10*3/uL — ABNORMAL HIGH (ref 4.0–10.5)

## 2012-10-20 MED ORDER — INSULIN GLARGINE 100 UNIT/ML ~~LOC~~ SOLN
20.0000 [IU] | Freq: Every day | SUBCUTANEOUS | Status: DC
  Administered 2012-10-20: 20 [IU] via SUBCUTANEOUS
  Filled 2012-10-20 (×3): qty 0.2

## 2012-10-20 MED ORDER — FAMOTIDINE 20 MG PO TABS
20.0000 mg | ORAL_TABLET | Freq: Two times a day (BID) | ORAL | Status: DC
  Administered 2012-10-20 – 2012-10-21 (×3): 20 mg via ORAL
  Filled 2012-10-20 (×3): qty 1

## 2012-10-20 MED ORDER — PREDNISONE 20 MG PO TABS
40.0000 mg | ORAL_TABLET | Freq: Every day | ORAL | Status: DC
  Administered 2012-10-20 – 2012-10-21 (×2): 40 mg via ORAL
  Filled 2012-10-20 (×2): qty 2

## 2012-10-20 NOTE — Progress Notes (Signed)
ANTIBIOTIC CONSULT NOTE  Pharmacy Consult for Vancomycin Indication: cellulitis  Allergies  Allergen Reactions  . Bee Venom Swelling  . Shellfish Allergy Nausea And Vomiting    Patient Measurements: Height: 5\' 6"  (167.6 cm) Weight: 241 lb 13.5 oz (109.7 kg) IBW/kg (Calculated) : 63.8  Vital Signs: Temp: 98.2 F (36.8 C) (06/20 1015) Temp src: Oral (06/20 1015) BP: 125/74 mmHg (06/20 1015) Pulse Rate: 113 (06/20 1015) Intake/Output from previous day: 06/19 0701 - 06/20 0700 In: 880 [P.O.:480; IV Piggyback:400] Out: 700 [Urine:700] Intake/Output from this shift: Total I/O In: 120 [P.O.:120] Out: -   Labs:  Recent Labs  10/18/12 0739 10/19/12 0542 10/20/12 0522  WBC 20.9* 23.1* 19.7*  HGB 13.0 13.8 13.7  PLT 180 201 235  CREATININE 0.40* 0.40*  --    Estimated Creatinine Clearance: 125.6 ml/min (by C-G formula based on Cr of 0.4).  Recent Labs  10/20/12 0521  VANCOTROUGH 9.5*     Microbiology: Recent Results (from the past 720 hour(s))  URINE CULTURE     Status: None   Collection Time    10/15/12  2:28 PM      Result Value Range Status   Specimen Description URINE, CLEAN CATCH   Final   Special Requests NONE   Final   Culture  Setup Time 10/15/2012 21:36   Final   Colony Count 20,OOO COLONIES/ML   Final   Culture     Final   Value: Multiple bacterial morphotypes present, none predominant. Suggest appropriate recollection if clinically indicated.   Report Status 10/16/2012 FINAL   Final  MRSA PCR SCREENING     Status: Abnormal   Collection Time    10/15/12  6:06 PM      Result Value Range Status   MRSA by PCR POSITIVE (*) NEGATIVE Final   Comment:            The GeneXpert MRSA Assay (FDA     approved for NASAL specimens     only), is one component of a     comprehensive MRSA colonization     surveillance program. It is not     intended to diagnose MRSA     infection nor to guide or     monitor treatment for     MRSA infections.     RESULT  CALLED TO, READ BACK BY AND VERIFIED WITH:     K. FAINT AT 2107 ON 10/15/12 BY Wynonia Lawman    Medical History: Past Medical History  Diagnosis Date  . DM (diabetes mellitus)   . Unintentional weight loss 10/16/2012  . DM (diabetes mellitus) type 2, uncontrolled, with ketoacidosis 10/16/2012    DKA  . Gout   . Protein-calorie malnutrition, severe 10/20/2012  . Cervical stenosis of spine 10/20/2012    Per CT  . Shoulder impingement syndrome 10/20/2012  . Right inguinal hernia 10/20/2012    Large, without obstruction  . Cholelithiasis 10/20/2012    Medications:  Scheduled:  . Chlorhexidine Gluconate Cloth  6 each Topical Q0600  . diclofenac sodium  4 g Topical TID  . docusate sodium  100 mg Oral BID  . famotidine  20 mg Oral BID  . feeding supplement  237 mL Oral BID BM  . feeding supplement  30 mL Oral BID  . insulin aspart  0-15 Units Subcutaneous TID WC  . insulin aspart  0-5 Units Subcutaneous QHS  . insulin glargine  20 Units Subcutaneous QHS  . metFORMIN  500 mg Oral BID WC  .  multivitamin with minerals  1 tablet Oral Daily  . nicotine  14 mg Transdermal Daily  . polyethylene glycol  17 g Oral Daily  . predniSONE  40 mg Oral Q breakfast  . vancomycin  1,000 mg Intravenous Q12H   Assessment: 52 yo obese M admitted 6/15 with DKA subsequently noted to have gout flare vs. Cellulitis.  He is on day#3 Vancomycin.   Renal function is at patient's baseline.  Vancomycin trough level at low end of goal range today.   Goal of Therapy:  Vancomycin trough level 10-15 mcg/ml  Plan:  1) Continue Vancomycin 1g IV q12h 2) Weekly Vancomycin trough while on medication 3) Monitor renal function and cx data   Luke [name] 10/20/2012,12:52 PM

## 2012-10-20 NOTE — Progress Notes (Signed)
NUTRITION FOLLOW UP  Intervention:   Continue with current nutrition POC. Recommend re-weigh pt to assess wt changes (last wt 10/16/12).  Nutrition Dx:   Nutrition-related knowledge deficit- ongoing  Goal:   Pt will comply with carbohydrate modified diet and practice consistent portion control during hospital stay  Monitor:   PO intake, wt changes, labs, skin integrity, CBG's, changes in status  Assessment:   Pt received inpatient diabetes education by RD and diabetes coordinator on 10/16/12, due to newly diagnosed diabetes. Pt has been given information on free outpatient diabetes class at [facility], which has been included in AVS.  Noted 29% wt loss x 1 year, likely related to poor glycemic control.  Intake of carb modified diet good; PO: 50-100%. Also noted improvement in glycemic control- last 2 CBGs 136-182.  Pt receiving Glucerna BID and 30 ml Prostat TID, which was ordered at last RD visit.  Discharge disposition is to d/c to [facility] when medically stable, due to gait disorder and deconditioning.   Height: Ht Readings from Last 1 Encounters:  10/15/12 5\' 6"  (1.676 m)    Weight Status:   Wt Readings from Last 1 Encounters:  10/16/12 241 lb 13.5 oz (109.7 kg)    Re-estimated needs:  Kcal: 1430-1560 kcals daily (to promote slow, gradual wt loss) Protein: 98-110 grams daily Fluid: >2500 ml daily  Skin: WDL  Diet Order: Carb Control   Intake/Output Summary (Last 24 hours) at 10/20/12 1242 Last data filed at 10/20/12 0915  Gross per 24 hour  Intake    520 ml  Output    700 ml  Net   -180 ml    Last BM: 10/19/2012   Labs:   Recent Labs Lab 10/16/12 1213 10/18/12 0739 10/19/12 0542  NA 132* 132* 134*  K 3.5 3.8 3.7  CL 99 95* 95*  CO2 21 26 27   BUN 4* 8 10  CREATININE 0.43* 0.40* 0.40*  CALCIUM 9.3 9.2 9.0  GLUCOSE 203* 261* 303*    CBG (last 3)   Recent Labs  10/19/12 2051 10/20/12 0720 10/20/12 1131  GLUCAP 182* 136* 332*    Scheduled  Meds: . Chlorhexidine Gluconate Cloth  6 each Topical Q0600  . diclofenac sodium  4 g Topical TID  . docusate sodium  100 mg Oral BID  . famotidine  20 mg Oral BID  . feeding supplement  237 mL Oral BID BM  . feeding supplement  30 mL Oral BID  . insulin aspart  0-15 Units Subcutaneous TID WC  . insulin aspart  0-5 Units Subcutaneous QHS  . insulin glargine  20 Units Subcutaneous QHS  . metFORMIN  500 mg Oral BID WC  . multivitamin with minerals  1 tablet Oral Daily  . nicotine  14 mg Transdermal Daily  . polyethylene glycol  17 g Oral Daily  . predniSONE  40 mg Oral Q breakfast  . vancomycin  1,000 mg Intravenous Q12H    Continuous Infusions:   Melody Haver, RD, LDN Pager: [phone]

## 2012-10-20 NOTE — Progress Notes (Signed)
Occupational Therapy Treatment Patient Details Name: Luke [name] MRN: [id number] DOB: [birth year] Today's Date: 10/20/2012 Time: 5784-6962 OT Time Calculation (min): 49 min  OT Assessment / Plan / Recommendation Comments on Treatment Session Patient on commode when therapy arrived. Patient finished with toileting and used RW to transfer to bed. Patient completed UE exercised while seated on edge of bed. Patient complains of right shoulder joint weakness and limited mobility versus wednesday when his left shoulder was the issue.                    Plan Discharge plan remains appropriate    Precautions / Restrictions Precautions Precautions: Fall Restrictions Weight Bearing Restrictions: Yes RLE Weight Bearing: Weight bearing as tolerated   Pertinent Vitals/Pain No complaints.    ADL  Lower Body Dressing: Performed;Minimal assistance Where Assessed - Lower Body Dressing: Unsupported sitting;Supported standing Toilet Transfer: Performed;Minimal assistance Toilet Transfer Method: Stand pivot Toilet Transfer Equipment: Bedside commode Toileting - Clothing Manipulation and Hygiene: Performed;+1 Total assistance Where Assessed - Glass blower/designer Manipulation and Hygiene: Standing Equipment Used: Gait belt;Rolling walker      OT Goals Arm Goals Additional Arm Goal #1: Patient will be educated on HEP Arm Goal: Additional Goal #1 - Progress: Progressing toward goals Additional Arm Goal #2: Patient will decrease pain level to 1/10 to be able to perform work tasks with less difficulty. Arm Goal: Additional Goal #2 - Progress: Met Miscellaneous OT Goals Miscellaneous OT Goal #1: Patient will increase PROM to WNL to be able to get dressed with less difficulty.  OT Goal: Miscellaneous Goal #1 - Progress: Progressing toward goals  Visit Information  Last OT Received On: 10/20/12 Assistance Needed: +1    Subjective Data  Subjective: S: Boy, I could have used you yesterday. My  shoulder was really killing me.  Patient Stated Goal: To get stronger.       Cognition  Cognition Arousal/Alertness: Awake/alert Behavior During Therapy: WFL for tasks assessed/performed Overall Cognitive Status: Within Functional Limits for tasks assessed       Exercises  General Exercises - Upper Extremity Shoulder Flexion: AAROM;PROM;Right;10 reps;Seated (therapist assisted with AAROM ) Shoulder Horizontal ABduction: PROM;Right;10 reps;Seated Shoulder Horizontal ADduction: PROM;Right;10 reps;Seated Other Exercises Other Exercises: Shoulder protraction; 10X; seated; AAROM with assist from therapist for support. Other Exercises: PROM, shoulder IR, Right, 10X Other Exercises: Shoulder elevation, 10X, seated Other Exercises: Shoulder row; 10X; seated Other Exercises: MFR to right shoulder to decrease fascial restrictions to increase joint mobilty in a pain free zone.       End of Session OT - End of Session Equipment Utilized During Treatment: Gait belt Activity Tolerance: Patient tolerated treatment well Patient left: in bed;with call bell/phone within reach Nurse Communication: Mobility status    Luke [name], OTR/L,CBIS   10/20/2012, 10:52 AM

## 2012-10-20 NOTE — Progress Notes (Signed)
PT Cancellation Note  Patient Details Name: Luke [name] MRN: [id number] DOB: 1960-12-15   Cancelled Treatment:    Reason Eval/Treat Not Completed: Pain limiting ability to participate Pt states that he still has 10/10 pain in the right ankle with weight bearing.  He continues to have moderate edema in the right ankle but no warmth or erythema.  His ROM is WNL at ankle.  He declines to ambulate because it hurts too much. We discussed the fact that he may be discharged in the near future..he does not seem to have concerns about being able to ambulate once ankle is pain free.    Myrlene Broker L 10/20/2012, 1:43 PM

## 2012-10-20 NOTE — Consult Note (Signed)
[facility] Peirce Deveney A. Gerilyn Pilgrim, MD     [email or web address]          Luke [name] is an 52 y.o. male.   ASSESSMENT/PLAN: Impression is that this gentleman most likely has multifactorial gait impairment including significant arthropathy of unclear etiology, possibly due to gout.  The patient's weakness of the upper extremity is most likely also due to arthropathy, however, the prominent distribution of the weakness involving the right upper extremity seems to approximate a C5-C6 distribution associated with significant pain.  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  The treatment for Parsonage-Turner syndrome is high- dose steroids, although this may be contraindicated in this patient given the hyperglycemia/diabetes mellitus/DKA.  I think we will follow the patient clinically and continue with physical therapy.  An EMG in outpatient setting could be beneficial to help further delineate the plexopathy if he does not improve.  The patient is a 52 year old African-American man who presents with significant pain, soreness, swelling, and weakness of all 4 extremities. The patient has been worked up and he has been diagnosed with new-onset diabetes mellitus.  It appears that he may have DKA.  The patient has had significant swelling of the ankles and joints, which has improved since being admitted.  He actually reports improvement in strength of the legs, although they still are symptomatic.  He reports having persistent weakness of the upper extremities.  Interestingly, the left upper extremity was actually weaker than the right, but this has improved significantly, i.e., weakness on the left side.  Now he reports persistent and worsening weakness of the right upper  extremity associated with pain.  Pain is in the back and shoulder region, even involving the thoracic area.  The patient does not report headache, dysarthria, dysphagia, chest pain, shortness of breath, dyspnea, GI, GU symptoms.  PHYSICAL EXAMINATION:  HEENT:  Head is normocephalic, atraumatic. NECK:  Supple. ABDOMEN:  Soft. EXTREMITIES:  There is significant swelling of the right ankle and right distal legs.  He reports this had actually improved since admission.  He has significant soreness on range of motion of the ankles and knees.  He has significant soreness to palpation upper thoracic spinal region especially on the right side.  Interestingly, range of motion of the shoulder does not elicit the typical severe pain that one would expect from shoulder problems. MENTATION:  He is awake and alert.  He is lucid and coherent.  Speech, language, and cognition are intact.  Cranial nerve evaluation, pupils are equal, round, reactive to light and accommodation.  Extraocular movements are intact.  Visual fields are full.  Facial, muscle strength is symmetric.  Tongue is midline.  Uvula midline.  Shoulder shrugs are actually pretty good bilaterally.  He has good strength of the trapezius muscles bilaterally.  Motor examination, the patient has profound weakness of deltoid and biceps, triceps are relatively preserved 4+, hand grip is normal and distal hand strength are normal on the right side.  Left side shows mild weakness throughout both proximally and distally that is 4/5.  He does have weakness of the legs bilaterally. Distal strength is 5.  Proximal strength is about 3.   Reflexes are preserved throughout 2+ in both upper and lower extremities.  Plantars are both downgoing.  Sensation, he responds to painful stimuli and light touch bilaterally.  Past Medical History  Diagnosis Date  . DM (diabetes mellitus)   . Unintentional weight loss 10/16/2012  . DM (diabetes mellitus)  type 2, uncontrolled, with ketoacidosis 10/16/2012    DKA  . Gout   . Protein-calorie malnutrition, severe 10/20/2012  . Cervical stenosis of spine 10/20/2012    Per CT  . Shoulder impingement syndrome 10/20/2012  . Right inguinal hernia 10/20/2012    Large, without obstruction  . Cholelithiasis 10/20/2012    Past Surgical History  Procedure Laterality Date  . No past surgeries      Family History  Problem Relation Age of Onset  . Arthritis    . Diabetes Brother   . Diabetes Brother     Social History:  reports that he has been smoking.  He does not have any smokeless tobacco history on file. He reports that he drinks about 0.6 ounces of alcohol per week. He reports that he does not use illicit drugs.  Allergies:  Allergies  Allergen Reactions  . Bee Venom Swelling  . Shellfish Allergy Nausea And Vomiting    Medications: Prior to Admission medications   Medication Sig Start Date End Date Taking? Authorizing Provider  diphenhydrAMINE (BENADRYL) 25 MG tablet Take 25-50 mg by mouth daily as needed. For itching and allergy symptoms   Yes Historical Provider, MD  ibuprofen (ADVIL,MOTRIN) 200 MG tablet Take 400 mg by mouth as needed. For pain   Yes Historical Provider, MD  metFORMIN (GLUCOPHAGE) 500 MG tablet Take 500 mg by mouth 2 (two) times daily with a meal.   Yes Historical Provider, MD  Naproxen Sodium (ALEVE PO) Take 1 tablet by mouth daily as needed (knee pain).   Yes Historical Provider, MD  Oxymetazoline HCl (NASAL SPRAY NA) Place 2-3 sprays into the nose every 8 (eight) hours as needed. For allergy symptoms    Yes Historical Provider, MD    Scheduled Meds: . Chlorhexidine Gluconate Cloth  6 each Topical Q0600  . diclofenac sodium  4 g Topical TID  . docusate sodium  100 mg Oral BID  . famotidine  20 mg Oral BID  . feeding supplement  237 mL Oral BID BM  . feeding supplement  30 mL Oral BID  . insulin aspart  0-15 Units Subcutaneous TID WC  . insulin aspart  0-5 Units  Subcutaneous QHS  . insulin glargine  20 Units Subcutaneous QHS  . metFORMIN  500 mg Oral BID WC  . multivitamin with minerals  1 tablet Oral Daily  . nicotine  14 mg Transdermal Daily  . polyethylene glycol  17 g Oral Daily  . predniSONE  40 mg Oral Q breakfast  . vancomycin  1,000 mg Intravenous Q12H   Continuous Infusions:  PRN Meds:.acetaminophen, dextrose, HYDROcodone-acetaminophen, morphine injection, ondansetron (ZOFRAN) IV    Blood pressure 120/73, pulse 111, temperature 98.6 F (37 C), temperature source Oral, resp. rate 20, height 5\' 6"  (1.676 m), weight 109.7 kg (241 lb 13.5 oz), SpO2 99.00%.   Results for orders placed during the hospital encounter of 10/15/12 (from the past 48 hour(s))  CBC     Status: Abnormal   Collection Time    10/19/12  5:42 AM      Result Value Range   WBC 23.1 (*) 4.0 - 10.5 K/uL   RBC 4.83  4.22 - 5.81 MIL/uL   Hemoglobin 13.8  13.0 - 17.0 g/dL   HCT 16.1  09.6 - 04.5 %   MCV 82.2  78.0 - 100.0 fL  MCH 28.6  26.0 - 34.0 pg   MCHC 34.8  30.0 - 36.0 g/dL   RDW 16.1  09.6 - 04.5 %   Platelets 201  150 - 400 K/uL  BASIC METABOLIC PANEL     Status: Abnormal   Collection Time    10/19/12  5:42 AM      Result Value Range   Sodium 134 (*) 135 - 145 mEq/L   Potassium 3.7  3.5 - 5.1 mEq/L   Chloride 95 (*) 96 - 112 mEq/L   CO2 27  19 - 32 mEq/L   Glucose, Bld 303 (*) 70 - 99 mg/dL   BUN 10  6 - 23 mg/dL   Creatinine, Ser 4.09 (*) 0.50 - 1.35 mg/dL   Calcium 9.0  8.4 - 81.1 mg/dL   GFR calc non Af Amer >90  >90 mL/min   GFR calc Af Amer >90  >90 mL/min   Comment:            The eGFR has been calculated     using the CKD EPI equation.     This calculation has not been     validated in all clinical     situations.     eGFR's persistently     <90 mL/min signify     possible Chronic Kidney Disease.  CK     Status: None   Collection Time    10/19/12  5:42 AM      Result Value Range   Total CK 13  7 - 232 U/L  GLUCOSE, CAPILLARY      Status: Abnormal   Collection Time    10/19/12  7:08 AM      Result Value Range   Glucose-Capillary 339 (*) 70 - 99 mg/dL   Comment 1 Notify RN    GLUCOSE, CAPILLARY     Status: Abnormal   Collection Time    10/19/12 11:27 AM      Result Value Range   Glucose-Capillary 272 (*) 70 - 99 mg/dL   Comment 1 Notify RN    GLUCOSE, CAPILLARY     Status: Abnormal   Collection Time    10/19/12  8:51 PM      Result Value Range   Glucose-Capillary 182 (*) 70 - 99 mg/dL   Comment 1 Notify RN     Comment 2 Documented in Chart    VANCOMYCIN, TROUGH     Status: Abnormal   Collection Time    10/20/12  5:21 AM      Result Value Range   Vancomycin Tr 9.5 (*) 10.0 - 20.0 ug/mL  CBC     Status: Abnormal   Collection Time    10/20/12  5:22 AM      Result Value Range   WBC 19.7 (*) 4.0 - 10.5 K/uL   RBC 4.72  4.22 - 5.81 MIL/uL   Hemoglobin 13.7  13.0 - 17.0 g/dL   HCT 91.4  78.2 - 95.6 %   MCV 83.1  78.0 - 100.0 fL   MCH 29.0  26.0 - 34.0 pg   MCHC 34.9  30.0 - 36.0 g/dL   RDW 21.3  08.6 - 57.8 %   Platelets 235  150 - 400 K/uL  LIPASE, BLOOD     Status: None   Collection Time    10/20/12  5:22 AM      Result Value Range   Lipase 33  11 - 59 U/L  HEPATIC FUNCTION PANEL  Status: Abnormal   Collection Time    10/20/12  5:22 AM      Result Value Range   Total Protein 7.2  6.0 - 8.3 g/dL   Albumin 1.8 (*) 3.5 - 5.2 g/dL   AST 15  0 - 37 U/L   ALT 14  0 - 53 U/L   Alkaline Phosphatase 246 (*) 39 - 117 U/L   Total Bilirubin 0.3  0.3 - 1.2 mg/dL   Bilirubin, Direct 0.1  0.0 - 0.3 mg/dL   Indirect Bilirubin 0.2 (*) 0.3 - 0.9 mg/dL  GLUCOSE, CAPILLARY     Status: Abnormal   Collection Time    10/20/12  7:20 AM      Result Value Range   Glucose-Capillary 136 (*) 70 - 99 mg/dL   Comment 1 Notify RN    GLUCOSE, CAPILLARY     Status: Abnormal   Collection Time    10/20/12 11:31 AM      Result Value Range   Glucose-Capillary 332 (*) 70 - 99 mg/dL   Comment 1 Notify RN    GLUCOSE,  CAPILLARY     Status: Abnormal   Collection Time    10/20/12  5:22 PM      Result Value Range   Glucose-Capillary 337 (*) 70 - 99 mg/dL   Comment 1 Notify RN     Comment 2 Documented in Chart      Ct Abdomen Pelvis Wo Contrast  10/19/2012   *RADIOLOGY REPORT*  Clinical Data: Diabetes, nausea, left leg weakness.  CT ABDOMEN AND PELVIS WITHOUT CONTRAST  Technique:  Multidetector CT imaging of the abdomen and pelvis was performed following the standard protocol without intravenous contrast.  Comparison: None.  Findings: Visualized lung bases clear.  Unremarkable uninfused evaluation of liver, spleen, adrenal glands.  Multiple large partially calcified stones in the contracted gallbladder.  Unremarkable pancreas.  Exophytic fluid attenuation lesion from the   lower pole left kidney.  No nephrolithiasis or hydronephrosis.  Aorta normal in caliber.  Stomach, small bowel, and colon are nondilated.  Right inguinal hernia involving loops of small bowel without evident obstruction or strangulation.  Normal appendix.  Urinary bladder physiologically distended.  Moderate prostatic enlargement. No ascites.  No free air.  Flowing osteophytes throughout the lower thoracic and lumbar spine.  Ankylosis across the sacroiliac joints. Osteophytes about bilateral hips.  IMPRESSION:  1.  Large right inguinal hernia containing small bowel, without evidence of obstruction or strangulation. 2.  Cholelithiasis.   Original Report Authenticated By: D. Andria Rhein, MD   Ct Cervical Spine Wo Contrast  10/19/2012   *RADIOLOGY REPORT*  Clinical Data: Neck pain and stiffness.  No injury.  Elevated white count  CT CERVICAL SPINE WITHOUT CONTRAST  Technique:  Multidetector CT imaging of the cervical spine was performed. Multiplanar CT image reconstructions were also generated.  Comparison: None  Findings: Normal alignment.  No fracture.  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  No evidence of diskitis or osteomyelitis.  No bony destruction.  Patchy areas of sclerosis are present C3-C6, most prominent C5 vertebral body.  This is most likely degenerative in nature however metastatic disease could have this appearance also.  Question prostate cancer.  C2-3:  Disc degeneration and spondylosis  C3-4:  Disc degeneration with spondylosis and mild O P L L.  Mild right facet degeneration and right foraminal encroachment  C4-5:  Disc degeneration with ossification of the posterior longitudinal ligament causing  mild spinal stenosis and mild foraminal stenosis bilaterally  C5-6:  Disc degeneration with ossification of the posterior longitudinal ligament causing mild spinal stenosis.  Mild foraminal encroachment bilaterally  C6-7:  Disc degeneration and mild spondylosis.  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  Negative for fracture.  Patchy areas of vertebral body sclerosis, most likely degenerative however osteoblastic metastatic disease is a possibility.   Original Report Authenticated By: Janeece Riggers, M.D.        Amanda Pote A. Gerilyn Pilgrim, M.D.  Diplomate, Biomedical engineer of Psychiatry and Neurology ( Neurology). 10/20/2012, 9:15 PM

## 2012-10-20 NOTE — Clinical Social Work Note (Signed)
CSW spoke with [facility] who reports pt will be responsible for $700. Pt was already notified of this by facility and had talked to family. He states there is no way they can come up with this amount of money and he will have to go live with his sister who can be with him around the clock. Facility had not received SNF authorization yet. Pt is requesting home health if possible. CM and MD notified. CSW will sign off but can be reconsulted if needed.  Derenda Fennel, [place] [phone]

## 2012-10-20 NOTE — Progress Notes (Signed)
UR chart review completed.  

## 2012-10-20 NOTE — Progress Notes (Signed)
Subjective: He feels better this afternoon. No nausea, vomiting, or abdominal pain. No worsening pain in his shoulders neck and right foot. In fact, the swelling and pain is less in his right foot.   Objective: Vital signs in last 24 hours: Filed Vitals:   10/19/12 2100 10/20/12 0200 10/20/12 0500 10/20/12 1015  BP: 122/65 133/70 130/69 125/74  Pulse: 105 103 104 113  Temp: 97.9 F (36.6 C) 98 F (36.7 C) 97.7 F (36.5 C) 98.2 F (36.8 C)  TempSrc: Oral Oral Oral Oral  Resp: 20 17 18 20   Height:      Weight:      SpO2: 99% 99% 95% 96%    Intake/Output Summary (Last 24 hours) at 10/20/12 1309 Last data filed at 10/20/12 0915  Gross per 24 hour  Intake    520 ml  Output    700 ml  Net   -180 ml    Weight change:   Physical exam: General: Obese 52 year old African-American man sitting up in bed, in no acute distress. Neck: Very limited range of motion with flexion and extension and lateral rotation. Mild to moderate tenderness over bilateral area of neck/trapezius muscles and over cervical spine. No palpable erythema or edema. Lungs: Clear anteriorly with decreased breath sounds in the bases. Heart: S1, S2, with less tachycardia compared to yesterday. Abdomen: Obese, positive bowel sounds, soft, nontender, nondistended. Extremities: Right lower extremity with 1-2+ nonpitting edema and resolved erythema above the ankle laterally; significantly less tenderness. No edema or erythema of the left lower extremity. Pulses barely palpable bilaterally. Neurologic: He is able to shrug his shoulders bilaterally. He is able to lift his distal arms, but is unable to lift his proximal arms no more than 10. He is able to lift both legs against gravity. He has good hand grip bilaterally. Sensation is grossly intact of his upper and lower extremities. Globally decreased sensation of his plantar surfaces bilaterally. He is alert and oriented x3. Cranial nerves II through XII are intact.  Lab  Results: Basic Metabolic Panel:  Recent Labs  40/98/11 0739 10/19/12 0542  NA 132* 134*  K 3.8 3.7  CL 95* 95*  CO2 26 27  GLUCOSE 261* 303*  BUN 8 10  CREATININE 0.40* 0.40*  CALCIUM 9.2 9.0   Liver Function Tests:  Recent Labs  10/20/12 0522  AST 15  ALT 14  ALKPHOS 246*  BILITOT 0.3  PROT 7.2  ALBUMIN 1.8*    Recent Labs  10/20/12 0522  LIPASE 33   No results found for this basename: AMMONIA,  in the last 72 hours CBC:  Recent Labs  10/18/12 0739 10/19/12 0542 10/20/12 0522  WBC 20.9* 23.1* 19.7*  NEUTROABS 17.5*  --   --   HGB 13.0 13.8 13.7  HCT 37.3* 39.7 39.2  MCV 82.5 82.2 83.1  PLT 180 201 235   Cardiac Enzymes:  Recent Labs  10/19/12 0542  CKTOTAL 13   BNP: No results found for this basename: PROBNP,  in the last 72 hours D-Dimer: No results found for this basename: DDIMER,  in the last 72 hours CBG:  Recent Labs  10/18/12 2108 10/19/12 0708 10/19/12 1127 10/19/12 2051 10/20/12 0720 10/20/12 1131  GLUCAP 354* 339* 272* 182* 136* 332*   Hemoglobin A1C: No results found for this basename: HGBA1C,  in the last 72 hours Fasting Lipid Panel: No results found for this basename: CHOL, HDL, LDLCALC, TRIG, CHOLHDL, LDLDIRECT,  in the last 72 hours Thyroid Function Tests:  Recent Labs  10/18/12 1254  TSH 0.852  FREET4 1.46   Anemia Panel: No results found for this basename: VITAMINB12, FOLATE, FERRITIN, TIBC, IRON, RETICCTPCT,  in the last 72 hours Coagulation: No results found for this basename: LABPROT, INR,  in the last 72 hours Urine Drug Screen: Drugs of Abuse  No results found for this basename: labopia,  cocainscrnur,  labbenz,  amphetmu,  thcu,  labbarb    Alcohol Level: No results found for this basename: ETH,  in the last 72 hours Urinalysis: No results found for this basename: COLORURINE, APPERANCEUR, LABSPEC, PHURINE, GLUCOSEU, HGBUR, BILIRUBINUR, KETONESUR, PROTEINUR, UROBILINOGEN, NITRITE, LEUKOCYTESUR,  in  the last 72 hours Misc. Labs:   Micro: Recent Results (from the past 240 hour(s))  URINE CULTURE     Status: None   Collection Time    10/15/12  2:28 PM      Result Value Range Status   Specimen Description URINE, CLEAN CATCH   Final   Special Requests NONE   Final   Culture  Setup Time 10/15/2012 21:36   Final   Colony Count 20,OOO COLONIES/ML   Final   Culture     Final   Value: Multiple bacterial morphotypes present, none predominant. Suggest appropriate recollection if clinically indicated.   Report Status 10/16/2012 FINAL   Final  MRSA PCR SCREENING     Status: Abnormal   Collection Time    10/15/12  6:06 PM      Result Value Range Status   MRSA by PCR POSITIVE (*) NEGATIVE Final   Comment:            The GeneXpert MRSA Assay (FDA     approved for NASAL specimens     only), is one component of a     comprehensive MRSA colonization     surveillance program. It is not     intended to diagnose MRSA     infection nor to guide or     monitor treatment for     MRSA infections.     RESULT CALLED TO, READ BACK BY AND VERIFIED WITH:     K. FAINT AT 2107 ON 10/15/12 BY Wynonia Lawman    Studies/Results: Ct Abdomen Pelvis Wo Contrast  10/19/2012   *RADIOLOGY REPORT*  Clinical Data: Diabetes, nausea, left leg weakness.  CT ABDOMEN AND PELVIS WITHOUT CONTRAST  Technique:  Multidetector CT imaging of the abdomen and pelvis was performed following the standard protocol without intravenous contrast.  Comparison: None.  Findings: Visualized lung bases clear.  Unremarkable uninfused evaluation of liver, spleen, adrenal glands.  Multiple large partially calcified stones in the contracted gallbladder.  Unremarkable pancreas.  Exophytic fluid attenuation lesion from the   lower pole left kidney.  No nephrolithiasis or hydronephrosis.  Aorta normal in caliber.  Stomach, small bowel, and colon are nondilated.  Right inguinal hernia involving loops of small bowel without evident obstruction or  strangulation.  Normal appendix.  Urinary bladder physiologically distended.  Moderate prostatic enlargement. No ascites.  No free air.  Flowing osteophytes throughout the lower thoracic and lumbar spine.  Ankylosis across the sacroiliac joints. Osteophytes about bilateral hips.  IMPRESSION:  1.  Large right inguinal hernia containing small bowel, without evidence of obstruction or strangulation. 2.  Cholelithiasis.   Original Report Authenticated By: D. Andria Rhein, MD   Ct Cervical Spine Wo Contrast  10/19/2012   *RADIOLOGY REPORT*  Clinical Data: Neck pain and stiffness.  No injury.  Elevated white count  CT  CERVICAL SPINE WITHOUT CONTRAST  Technique:  Multidetector CT imaging of the cervical spine was performed. Multiplanar CT image reconstructions were also generated.  Comparison: None  Findings: Normal alignment.  No fracture.  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  No evidence of diskitis or osteomyelitis.  No bony destruction.  Patchy areas of sclerosis are present C3-C6, most prominent C5 vertebral body.  This is most likely degenerative in nature however metastatic disease could have this appearance also.  Question prostate cancer.  C2-3:  Disc degeneration and spondylosis  C3-4:  Disc degeneration with spondylosis and mild O P L L.  Mild right facet degeneration and right foraminal encroachment  C4-5:  Disc degeneration with ossification of the posterior longitudinal ligament causing mild spinal stenosis and mild foraminal stenosis bilaterally  C5-6:  Disc degeneration with ossification of the posterior longitudinal ligament causing mild spinal stenosis.  Mild foraminal encroachment bilaterally  C6-7:  Disc degeneration and mild spondylosis.  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  Negative for fracture.  Patchy  areas of vertebral body sclerosis, most likely degenerative however osteoblastic metastatic disease is a possibility.   Original Report Authenticated By: Janeece Riggers, M.D.   US Venous Img Lower Unilateral Right  10/18/2012   *RADIOLOGY REPORT*  Clinical Data: Right lower extremity pain and edema.  RIGHT LOWER EXTREMITY VENOUS DUPLEX ULTRASOUND  Technique:  Gray-scale sonography with graded compression, as well as color Doppler and duplex ultrasound were performed to evaluate the deep venous system of the lower extremity from the level of the common femoral vein through the popliteal and proximal calf veins. Spectral Doppler was utilized to evaluate flow at rest and with distal augmentation maneuvers.  Comparison:  None.  Findings:  Normal compressibility of the common femoral, superficial femoral, and popliteal veins is demonstrated, as well as the visualized proximal calf veins.  No filling defects to suggest DVT on grayscale or color Doppler imaging.  Doppler waveforms show normal direction of venous flow, normal respiratory phasicity and response to augmentation.  No evidence of superficial thrombophlebitis or abnormal fluid collection.  IMPRESSION: No evidence of right lower extremity deep vein thrombosis.   Original Report Authenticated By: Irish Lack, M.D.    Medications:  Scheduled: . Chlorhexidine Gluconate Cloth  6 each Topical Q0600  . diclofenac sodium  4 g Topical TID  . docusate sodium  100 mg Oral BID  . famotidine  20 mg Oral BID  . feeding supplement  237 mL Oral BID BM  . feeding supplement  30 mL Oral BID  . insulin aspart  0-15 Units Subcutaneous TID WC  . insulin aspart  0-5 Units Subcutaneous QHS  . insulin glargine  20 Units Subcutaneous QHS  . metFORMIN  500 mg Oral BID WC  . multivitamin with minerals  1 tablet Oral Daily  . nicotine  14 mg Transdermal Daily  . polyethylene glycol  17 g Oral Daily  . predniSONE  40 mg Oral Q breakfast  . vancomycin  1,000 mg  Intravenous Q12H   Continuous:  WUJ:[id number], dextrose, HYDROcodone-acetaminophen, morphine injection, ondansetron (ZOFRAN) IV  Assessment: Active Problems:   Leg edema, right   Cellulitis of left lower extremity   Acute gout   Obesity   Sinus tachycardia   Unintentional weight loss   Hyponatremia   Neck stiffness   Neck pain, bilateral   Bilateral  arm weakness   Cellulitis of leg, right   Protein-calorie malnutrition, severe   Shoulder impingement syndrome   Cervical stenosis of spine   Right inguinal hernia   Cholelithiasis   1. Recently diagnosed type 2 diabetes with DKA on admission. Hemoglobin A1c 15.9. Status post insulin drip. The patient's blood glucose is better.  Continue sliding scale NovoLog and Lantus with adjustments as needed. Continue metformin. Hold glipizide to avoid hypoglycemia as he is blood glucose has decreased significantly. Continue diabetes education. Hopefully he'll be able to attend a free diabetes classes as an outpatient.  Neck stiffness/neck pain/proximal arm weakness; query shoulder impingement syndrome. Query progressive degenerative joint disease and superimposed gout. His total CK is within normal limits. His uric acid level is actually low. However, his sedimentation rate is elevated. He would not have been able to tolerate MRI of his neck because he is unable to lay flat for more than a few minutes. CT of his cervical spine reveals degenerative joint disease and mild cervical stenosis. The CT also revealed patchy areas of vertebral body sclerosis likely degenerative, but metastatic disease is a possibility. There appears to be no obvious signs of a thoracic or intra-abdominal malignancy per imaging studies. We'll ask neurologist, Dr. Gerilyn Pilgrim to assess him from a neurological standpoint.  Unintentional weight loss as reported in the setting of obesity. It is likely that his weight loss was from uncontrolled diabetes (hemoglobin A1c 15.9). The  registered dietitian's recommendations noted and appreciated.  Leukocytosis. His white blood cell count is still elevated without fever. It has decreased some. It was presumed that his leukocytosis is secondary to an acute inflammatory gout flare. CT of his abdomen and pelvis on 10/19/2012 revealed no intra-abdominal infection or masses except for the large right inguinal hernia. His urinalysis was unremarkable for infection in his CT of the chest was unremarkable for infection.  Right foot pain and bilateral shoulder pain/stiffness, presumably secondary to gout or inflammatory degenerative joint disease. His uric acid level is actually low, but his sed rate is elevated.. Naproxen has helped a little, but I gave him one dose of Solu-Medrol. Now that his blood glucose is better, we'll favor small doses of prednisone and continual when necessary opiate analgesics. Lower extremity ultrasound of his right leg reveals no DVT.  Right lower extremity cellulitis. Vancomycin was started empirically on 10/18/2012. Cellulitis is resolving.  Hyponatremia. Likely multifactorial including pseudo-hyponatremia from hyperglycemia and dehydration. It appears he has been amply hydrated. Will follow.  Sinus tachycardia. His TSH and free T4 are within normal limits. His heart rate is better.  Nausea and vomiting times one episode. Will treat symptomatically and follow.  Gait disorder/deconditioning. Physical therapy recommendations noted and appreciated. We'll pursue skilled nursing facility placement.    Plan:  1. Continue supportive treatment and physical therapy as tolerated. 2. Consult neurologist Dr. Gerilyn Pilgrim. 3. Start small dosing of prednisone, which will likely increase his white blood cell count. 4. Possible discharge to skilled nursing facility tomorrow.   LOS: 5 days   Jannatul Wojdyla 10/20/2012, 1:09 PM

## 2012-10-21 ENCOUNTER — Encounter (HOSPITAL_COMMUNITY): Payer: Self-pay | Admitting: Internal Medicine

## 2012-10-21 DIAGNOSIS — M064 Inflammatory polyarthropathy: Secondary | ICD-10-CM

## 2012-10-21 DIAGNOSIS — M459 Ankylosing spondylitis of unspecified sites in spine: Secondary | ICD-10-CM

## 2012-10-21 DIAGNOSIS — M1990 Unspecified osteoarthritis, unspecified site: Secondary | ICD-10-CM

## 2012-10-21 DIAGNOSIS — M453 Ankylosing spondylitis of cervicothoracic region: Secondary | ICD-10-CM

## 2012-10-21 HISTORY — DX: Ankylosing spondylitis of cervicothoracic region: M45.3

## 2012-10-21 HISTORY — DX: Unspecified osteoarthritis, unspecified site: M19.90

## 2012-10-21 LAB — CBC WITH DIFFERENTIAL/PLATELET
Basophils Relative: 0 % (ref 0–1)
Eosinophils Absolute: 0 10*3/uL (ref 0.0–0.7)
HCT: 40.2 % (ref 39.0–52.0)
Hemoglobin: 13.9 g/dL (ref 13.0–17.0)
MCH: 29.1 pg (ref 26.0–34.0)
MCHC: 34.6 g/dL (ref 30.0–36.0)
Monocytes Absolute: 1.7 10*3/uL — ABNORMAL HIGH (ref 0.1–1.0)
Monocytes Relative: 8 % (ref 3–12)
RDW: 15.3 % (ref 11.5–15.5)

## 2012-10-21 LAB — GLUCOSE, CAPILLARY: Glucose-Capillary: 149 mg/dL — ABNORMAL HIGH (ref 70–99)

## 2012-10-21 MED ORDER — PREDNISONE 10 MG PO TABS: ORAL_TABLET | ORAL | Status: DC

## 2012-10-21 MED ORDER — NICOTINE 14 MG/24HR TD PT24: MEDICATED_PATCH | TRANSDERMAL | Status: DC

## 2012-10-21 MED ORDER — INSULIN GLARGINE 100 UNIT/ML ~~LOC~~ SOLN: 20.0000 [IU] | Freq: Every day | SUBCUTANEOUS | Status: DC

## 2012-10-21 MED ORDER — DICLOFENAC SODIUM 1 % TD GEL: 2.0000 g | Freq: Three times a day (TID) | TRANSDERMAL | Status: DC | PRN

## 2012-10-21 MED ORDER — FAMOTIDINE 10 MG PO TABS: 10.0000 mg | ORAL_TABLET | Freq: Every day | ORAL | Status: DC

## 2012-10-21 MED ORDER — HYDROCODONE-ACETAMINOPHEN 5-325 MG PO TABS: 1.0000 | ORAL_TABLET | ORAL | Status: DC | PRN

## 2012-10-21 MED ORDER — ADULT MULTIVITAMIN W/MINERALS CH: 1.0000 | ORAL_TABLET | Freq: Every day | ORAL | Status: DC

## 2012-10-21 NOTE — Op Note (Signed)
Luke [name], Luke [name]                 ACCOUNT NO.:  [id number]  MEDICAL RECORD NO.:  [id number]  LOCATION:  A325                          FACILITY:  [facility]  PHYSICIAN:  Zeriyah Wain A. Gerilyn Pilgrim, M.D. DATE OF BIRTH:  [birth year]  DATE OF PROCEDURE: DATE OF DISCHARGE:                              REPORT   Impression is that this gentleman most likely has multifactorial gait impairment including significant arthropathy of unclear etiology, possibly due to gout.  The patient's weakness of the upper extremity is most likely also due to arthropathy, however, the prominent distribution of the weakness involving the right upper extremity seems to approximate a C5-C6 distribution associated with significant pain.  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  The treatment for Parsonage-Turner syndrome is high- dose steroids, although this may be contraindicated in this patient given the hyperglycemia/diabetes mellitus/DKA.  I think we will follow the patient clinically and continue with physical therapy.  An EMG in outpatient setting could be beneficial to help further delineate the plexopathy if he does not improve.  The patient is a 53 year old African-American man who presents with significant pain, soreness, swelling, and weakness of all 4 extremities. The patient has been worked up and he has been diagnosed with new-onset diabetes mellitus.  It appears that he may have DKA.  The patient has had significant swelling of the ankles and joints, which has improved since being admitted.  He actually reports improvement in strength of the legs, although they still are symptomatic.  He reports having persistent weakness of the upper extremities.  Interestingly, the left upper extremity was actually weaker than the  right, but this has improved significantly, i.e., weakness on the left side.  Now he reports persistent and worsening weakness of the right upper extremity associated with pain.  Pain is in the back and shoulder region, even involving the thoracic area.  The patient does not report headache, dysarthria, dysphagia, chest pain, shortness of breath, dyspnea, GI, GU symptoms.  PHYSICAL EXAMINATION:  HEENT:  Head is normocephalic, atraumatic. NECK:  Supple. ABDOMEN:  Soft. EXTREMITIES:  There is significant swelling of the right ankle and right distal legs.  He reports this had actually improved since admission.  He has significant soreness on range of motion of the ankles and knees.  He has significant soreness to palpation upper thoracic spinal region especially on the right side.  Interestingly, range of motion of the shoulder does not elicit the typical severe pain that one would expect from shoulder problems. MENTATION:  He is awake and alert.  He is lucid and coherent.  Speech, language, and cognition are intact.  Cranial nerve evaluation, pupils are equal, round, reactive to light and accommodation.  Extraocular movements are intact.  Visual fields are full.  Facial, muscle strength is symmetric.  Tongue is midline.  Uvula midline.  Shoulder shrugs are actually pretty good bilaterally.  He has good strength of the trapezius muscles bilaterally.  Motor examination, the patient has profound weakness of deltoid and biceps, triceps are relatively  preserved 4+, hand grip is normal and distal hand strength are normal on the right side.  Left side shows mild weakness throughout both proximally and distally that is 4/5.  He does have weakness of the legs bilaterally. Distal strength is 5.  Proximal strength is about 3.   Reflexes are preserved throughout 2+ in both upper and lower extremities.  Plantars are both downgoing.  Sensation, he responds to painful stimuli and light touch  bilaterally.       Mylan Schwarz A. Gerilyn Pilgrim, M.D.     KAD/MEDQ  D:  10/21/2012  T:  10/21/2012  Job:  [id number]

## 2012-10-21 NOTE — Progress Notes (Signed)
Pt and his sister Shawna Orleans verbalize understanding of follow up information, new medications, & discharge instructions. IV d/c. Pts sister will pick up walker at [facility] today. They will also speak to their pharmacist about how to use the insulin pen, which we do not have here to demonstrate with. I did explain use of the pen, as much as possible, as well as importance of checking blood sugar 1-2 times/day per Dr. Esmond Camper recommendation. I also reviewed importance of a carb modified diet, and gave them information on this. Initially, pts sister was overwhelmed and wondered about a payment plan for [facility] which was the initial d/c plan, until pt denied ability to pay the co-pay required.  I called [facility] on call who asked me to call [facility]. I called the [facility] and was directed to [facility], the administrator there. She is unable to accept pt over the weekend due to the fact more information would be needed regarding payment from [facility], which was not obtained due to the fact that [facility] was set up and family agreed to this plan yesterday. Pt and his sister agree to go home and are comfortable with discharge after I explained care to them. If they see that they still need assistance, they have agreed to call Onji at [facility] on Monday morning, per her request, and she will see what can be done to have him admitted there. D/C to home with [facility] RN and PT has been ordered by the physician and was set up yesterday by our case management dept. Pt has phone numbers for Dr. Gerilyn Pilgrim who saw the pt while he was here in case of needed follow up, as well as the number to register for diabetes classes, and the [facility] number. All questions were answered, and pt was d/c via wheelchair accompanied by NT. Sheryn Bison

## 2012-10-21 NOTE — Discharge Summary (Signed)
Physician Discharge Summary  Luke [name]:[id number] DOB: [birth year] DOA: 10/15/2012  PCP: No primary provider on file.  Admit date: 10/15/2012 Discharge date: 10/21/2012  Time spent: Greater than 30 minutes  Recommendations for Outpatient Follow-up:  1. Once the patient is established with a primary care physician, referral to a rheumatologist is recommended. 2. Consider followup with neurologist Dr. Gerilyn Pilgrim for an outpatient EMG if symptoms do not improve with corticosteroids. 3. Home health physical therapy and a rolling walker were ordered.  Discharge Diagnoses:   1. Recently diagnosed type 2 diabetes mellitus with presentation of DKA. The patient's hemoglobin A1c was 15.9. 2. Joint pain and stiffness in multiple sites and associated proximal bilateral upper extremity weakness and bilateral lower extremity weakness, likely secondary to acute and chronic inflammatory arthropathy. 3. 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 4. Query gout exacerbation, although, the patient's uric acid level was low-normal. 5. Right lower extremity cellulitis and edema. Resolved. Lower extremity venous ultrasound negative for DVT. 6. Sinus tachycardia, subsided. Thyroid function was within normal limits. 7. Morbid obesity. 8. Large right inguinal hernia without obstruction per CT of the abdomen and pelvis. 9. Incidental finding of cholelithiasis per CT scan of the abdomen. 10. Hyponatremia secondary to hyperglycemia and volume depletion. Resolved. 11. 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 8. History of unintentional weight loss, likely secondary to uncontrolled diabetes. 9. Severe protein calorie malnutrition. 10. Tobacco abuse. The patient was advised to stop smoking.  Discharge Condition: Improved, stable, but would benefit from skilled  nursing facility placement which was attempted.  Diet recommendation: Carbohydrate modified.  Filed Weights   10/15/12 1321 10/15/12 1803 10/16/12 0500  Weight: 111.585 kg (246 lb) 106.7 kg (235 lb 3.7 oz) 109.7 kg (241 lb 13.5 oz)    History of present illness:  The patient is a 52 year old man with a recent diagnosis of type 2 diabetes mellitus, who presented to the emergency department on 10/15/2012 with a complaint of neck pain, shoulder pain, leg pain, ankle pain. He also developed swelling in his right leg. He also reported an unintentional weight loss of 90 or more pounds over the past year. He had been recently started on metformin a few days prior to admission for newly diagnosed diabetes. In the emergency department, he was afebrile, tachycardic, but with a normal blood pressure. His lab data were significant for a blood glucose of 327, sodium of 131, CO2 of 16, and a d-dimer of 1.19. His urinalysis revealed greater than 1.030 urine specific gravity, 500 glucose, and greater than 80 ketones. His EKG revealed sinus tachycardia with a heart rate of 112 beats per minute. Multiple x-rays revealed no acute cardiopulmonary abnormalities. CT angiogram of his chest revealed no pulmonary embolism but with calcification of the anterior longitudinal ligament and thoracic spine interspinous ligament calcifications suggesting the possibility of ankylosing spondylitis. It also revealed a nodular density in the peripheral right lower lobe along, likely due to benign scarring. He was admitted for further evaluation and management.  Hospital Course:   1. Type 2 diabetes mellitus with DKA The patient was started on vigorous IV fluids. The insulin drip protocol was initiated. Per the protocol, his electrolytes and capillary blood glucose were monitored frequently. Once his anion gap closed, sliding-scale NovoLog and Lantus were ordered. With the resolution of acidosis and 48 hours past the administration of IV  contrast, metformin was restarted. Glipizide was also initiated  for 24 hours, but it was decided that it would be discontinued in favor of Lantus and metformin only. His hemoglobin A1c was 15.9, indicating long-term uncontrolled diabetes, although he was diagnosed approximately one week prior to this hospitalization. Diabetes education was given. Self glucose monitoring and self insulin injections were taught. His blood glucose was 149 prior to discharge. He was discharged on 20 units of Lantus and twice a day dosing of metformin.  2. Joint pain and stiffness in multiple sites/right lower extremity pain, edema, and erythema. Query acute and chronic inflammatory arthritis, possibly alkalosis in spondylitis. 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 The patient reported that he was diagnosed with gout in the past. Therefore, his pain was thought to be secondary to an acute exacerbation of gout. Naproxen was ordered and given for several days, but it did not improve his symptoms. Initial steroids were not given due  to the patient's DKA. However, once his diabetes was under better control, he was given Solu-Medrol once and then followed by a prednisone taper. Because there was some cellulitis around the right ankle, vancomycin was started empirically. A venous ultrasound of the right lower extremity was negative for DVT. Additional studies were ordered to assess his symptoms as the patient had difficulty raising his proximal arms and difficulty ambulating not only because of stiffness but also because of pain and weakness. His uric acid level was actually low-normal at 2.9. His total CK was only 13. His sedimentation rate was elevated at 107. CT of the cervical spine revealed mild spinal stenosis, mild spondylosis, and patchy areas of vertebral body sclerosis most likely degenerative changes. Neurologist Dr. Gerilyn Pilgrim was consulted. He ordered a thoracic spine  x-ray with swimmer's. The findings were most consistent with diffuse thoracic ankylosis, query ankylosing spondylitis. Per Dr. Gerilyn Pilgrim, the patient's gait impairment and pain/stiffness were likely multifactorial including significant arthropathy of unclear etiology, possibly gout, and possibly Parsonage-Turner syndrome or acute idiopathic cervical plexopathy. High-dose steroids would be the treatment of choice, but it was decided that he would be treated with a moderate prednisone taper given his recent DKA. Also, Dr. Gerilyn Pilgrim suggested that an EMG in the outpatient setting could be beneficial in delineating the plexopathy if he did not improve on steroid therapy.  The patient improved, but was still unable to ambulate without assistance. The cellulitis and edema of his right leg subsided. He was discharged with a rolling walker and physical therapy. His family will provide support. He was discharged prednisone to be tapered down to 5 mg daily and topical Voltaren gel. He would benefit from an outpatient rheumatology referral.  3. Persistent leukocytosis. The patient's white blood cell count vacillated, but generally stayed in the 16-20,000 range. A number of studies were ordered for evaluation. CT scan of the abdomen and pelvis revealed no intra-abdominal or intrapelvic process. CT of his chest, chest x-ray, urinalysis were not consistent with infection. His leukocytosis is thought to be from inflammatory arthropathy.  4. Sinus tachycardia. His thyroid function was assessed and his TSH and free T4 were both within normal limits. Following hydration and treatment of his pain, his heart rate moved toward normal.  5. Protein calorie malnutrition/morbid obesity/unintentional weight loss. The patient reported that unintentional weight loss of 90 or more pounds. Given his uncontrolled diabetes, it was likely that his weight loss was secondary to it. The registered dietitian was consulted. Subsequently, he  was started on nutritional supplements.  6. Disposition: All were in agreement, following the physical  therapy evaluation, that skilled nursing facility placement was needed temporarily for rehabilitation. However, his insurance carrier required a $700 co-pay, which he did not have. It is the weekend. Therefore, the patient was discharged to home with family support, home health physical therapy, and a rolling walker. His family will make an appointment for him with their family doctor either Dr. Felecia Shelling or Dr. Delbert Harness.    Procedures:  None  Consultations:  Neurologist, Dr. Gerilyn Pilgrim  Discharge Exam: Filed Vitals:   10/20/12 1015 10/20/12 1424 10/20/12 2227 10/21/12 0634  BP: 125/74 120/73 123/77 122/63  Pulse: 113 111 102 103  Temp: 98.2 F (36.8 C) 98.6 F (37 C) 98 F (36.7 C) 98.5 F (36.9 C)  TempSrc: Oral Oral Oral   Resp: 20 20 20 20   Height:      Weight:      SpO2: 96% 99% 99% 100%    General: Pleasant alert 52 year old African-American man sitting up in bed in no acute distress. Overall, he looks better. Cardiovascular: S1, S2, with a soft systolic murmur. Respiratory: Lungs: Are to auscultation bilaterally. Extremities: 1+ to trace right lower extremity edema, less than yesterday and resolution of erythema.  Discharge Instructions  Discharge Orders   Future Appointments Provider Department Dept Phone   10/30/2012 11:30 AM [facility] [phone]   Future Orders Complete By Expires     Diet Carb Modified  As directed     Discharge instructions  As directed     Comments:      You'll need to make an appointment with a local physician for ongoing treatment of your diabetes and inflammatory arthritis. Ask your new doctor to refer you to a rheumatologist who is a specialist in arthritis and joint abnormalities.  Check your blood sugars at least once or twice daily. Try to stop smoking.    Increase activity slowly  As  directed         Medication List    STOP taking these medications       ALEVE PO     ibuprofen 200 MG tablet  Commonly known as:  ADVIL,MOTRIN      TAKE these medications       diclofenac sodium 1 % Gel  Commonly known as:  VOLTAREN  Apply 2 g topically 3 (three) times daily as needed. For arthritis pain.     diphenhydrAMINE 25 MG tablet  Commonly known as:  BENADRYL  Take 25-50 mg by mouth daily as needed. For itching and allergy symptoms     famotidine 10 MG tablet  Commonly known as:  PEPCID  Take 1 tablet (10 mg total) by mouth daily.     HYDROcodone-acetaminophen 5-325 MG per tablet  Commonly known as:  NORCO/VICODIN  Take 1 tablet by mouth every 4 (four) hours as needed.     insulin glargine 100 UNIT/ML injection  Commonly known as:  LANTUS  Inject 0.2 mLs (20 Units total) into the skin at bedtime.     metFORMIN 500 MG tablet  Commonly known as:  GLUCOPHAGE  Take 500 mg by mouth 2 (two) times daily with a meal.     multivitamin with minerals Tabs  Take 1 tablet by mouth daily.     NASAL SPRAY NA  Place 2-3 sprays into the nose every 8 (eight) hours as needed. For allergy symptoms     nicotine 14 mg/24hr patch  Commonly known as:  NICODERM CQ - dosed in mg/24 hours  Use  as directed per label instructions.     predniSONE 10 MG tablet  Commonly known as:  DELTASONE  Starting tomorrow, take 5 tablets for 2 days; then 4 tablets for 2 days; then 3 tablets for 2 days; then 2 tablets for 2 days; then 1 tablet for 2 days; then a half a tablet thereafter. (For treatment of your inflammatory arthritis).       Allergies  Allergen Reactions  . Bee Venom Swelling  . Shellfish Allergy Nausea And Vomiting       Follow-up Information   Follow up with Altamese Cabal, RD, LDN [facility] On 10/23/2012.   Contact information:   Please call Altamese Cabal at [phone] at [facility] to register for free diabetes classes.  No referral needed.  Classes  are free of charge.      Follow up with [facility].   Contact information:   [address] [place] [address] [phone]       The results of significant diagnostics from this hospitalization (including imaging, microbiology, ancillary and laboratory) are listed below for reference.    Significant Diagnostic Studies: Ct Abdomen Pelvis Wo Contrast  10/19/2012   *RADIOLOGY REPORT*  Clinical Data: Diabetes, nausea, left leg weakness.  CT ABDOMEN AND PELVIS WITHOUT CONTRAST  Technique:  Multidetector CT imaging of the abdomen and pelvis was performed following the standard protocol without intravenous contrast.  Comparison: None.  Findings: Visualized lung bases clear.  Unremarkable uninfused evaluation of liver, spleen, adrenal glands.  Multiple large partially calcified stones in the contracted gallbladder.  Unremarkable pancreas.  Exophytic fluid attenuation lesion from the   lower pole left kidney.  No nephrolithiasis or hydronephrosis.  Aorta normal in caliber.  Stomach, small bowel, and colon are nondilated.  Right inguinal hernia involving loops of small bowel without evident obstruction or strangulation.  Normal appendix.  Urinary bladder physiologically distended.  Moderate prostatic enlargement. No ascites.  No free air.  Flowing osteophytes throughout the lower thoracic and lumbar spine.  Ankylosis across the sacroiliac joints. Osteophytes about bilateral hips.  IMPRESSION:  1.  Large right inguinal hernia containing small bowel, without evidence of obstruction or strangulation. 2.  Cholelithiasis.   Original Report Authenticated By: D. Andria Rhein, MD   Dg Chest 2 View  10/15/2012   *RADIOLOGY REPORT*  Clinical Data: Chest pain.  Fatigue.  Obesity.  CHEST - 2 VIEW  Comparison:  11/30/2011  Findings:  The heart size and mediastinal contours are within normal limits.  Both lungs are clear.  The visualized skeletal structures are unremarkable.  IMPRESSION: No active  cardiopulmonary disease.   Original Report Authenticated By: Myles Rosenthal, M.D.   Dg Thoracic Spine W/swimmers  10/20/2012   *RADIOLOGY REPORT*  Clinical Data: Thoracic spine pain  THORACIC SPINE - 2 VIEW + SWIMMERS  Comparison: CT chest 10/16/2047  Findings: Mild scattered endplate spur formation. Diffuse thoracic ankylosis and calcification of the anterior longitudinal ligament. Vertebral body heights maintained without fracture or subluxation. No bone destruction or widening paraspinal lines. Visualized posterior ribs unremarkable.  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   Original Report Authenticated By: Ulyses Southward, M.D.   Dg Shoulder Right  10/15/2012   *RADIOLOGY REPORT*  Clinical Data: right shoulder pain.  RIGHT SHOULDER - 2+ VIEW  Comparison: None.  Findings: No evidence of acute fracture or dislocation.  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  Moderate degenerative change are seen involving the acromioclavicular joint and there  is bony spurring of the undersurface of the acromion.  IMPRESSION:  1.  No acute findings. 2.  Degenerative changes, as described above.  These findings predispose to shoulder impingement syndrome.   Original Report Authenticated By: Myles Rosenthal, M.D.   Dg Ankle Complete Right  10/15/2012   *RADIOLOGY REPORT*  Clinical Data: Right ankle pain.  Obesity.  Fatigue.  RIGHT ANKLE - COMPLETE 3+ VIEW  Comparison: None.  Findings: No evidence of acute fracture or dislocation.  No evidence of ankle joint arthropathy.  Old fracture deformity of the calcaneus noted with prominent plantar calcaneal spur.  IMPRESSION: No acute findings.   Original Report Authenticated By: Myles Rosenthal, M.D.   Ct Angio Chest Pe W/cm &/or Wo Cm  10/15/2012   *RADIOLOGY REPORT*  Clinical Data: Shoulder pain, fatigue, elevated D-dimer.  CT ANGIOGRAPHY CHEST  Technique:  Multidetector CT imaging of the chest using the standard protocol during bolus  administration of intravenous contrast. Multiplanar reconstructed images including MIPs were obtained and reviewed to evaluate the vascular anatomy.  Contrast: OMNIPAQUE IOHEXOL 350 MG/ML SOLN  Comparison: chest radiograph 10/15/2012.  Findings: There is some respiratory motion.  This is a technically satisfactory examination of the pulmonary arterial tree.  No focal filling defects are identified in the pulmonary arteries to suggest pulmonary embolism.  Thoracic aorta is normal in caliber.  The thoracic aorta enhances normally.  Negative for dissection.  Heart size is normal.   No visualized atherosclerotic calcification in the thorax.  The proximal great vessels appear normal.  Probable small hiatal hernia.  Negative for pleural pericardial effusion. Thyroid gland unremarkable.  In the right lower lobe, adjacent to the major fissure and the visceral pleura, is an elongate somewhat nodular density that measures 2.8 cm craniocaudal span and 0.8 x 0.8 cm axial diameter. This oblong density is not visible on chest radiograph and there are no prior cross-sectional imaging studies of the chest for comparison.  The trachea and mainstem bronchi are patent.  No pulmonary consolidation or interstitial abnormalities identified.  Imaged portion of the upper abdomen shows no acute abnormality or mass.  There is a contiguous linear calcification of the anterior aspect of the thoracic spine vertebral bodies/anterior longitudinal ligament.  There is interspinous ligament calcification in the thoracic spine at multiple levels.  There are subchondral cysts in the clavicular heads and upper sternum at the level of the sternoclavicular joints bilaterally.  IMPRESSION: 1.  Negative for pulmonary embolism. 2.  Oblong/linear nodular density in the peripheral right lower lobe is likely due to benign scarring.  There are no prior chest CTs for comparison.  Follow-up noncontrast chest CT in 6 months is suggested. 3.  Calcification of  the anterior longitudinal ligament and thoracic spine interspinous ligament calcifications suggest the possibility of ankylosing spondylitis.  4.  Cystic degenerative changes of the sternoclavicular joints bilaterally.   Original Report Authenticated By: Britta Mccreedy, M.D.   Ct Cervical Spine Wo Contrast  10/19/2012   *RADIOLOGY REPORT*  Clinical Data: Neck pain and stiffness.  No injury.  Elevated white count  CT CERVICAL SPINE WITHOUT CONTRAST  Technique:  Multidetector CT imaging of the cervical spine was performed. Multiplanar CT image reconstructions were also generated.  Comparison: None  Findings: Normal alignment.  No fracture.  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  No evidence of diskitis or osteomyelitis.  No bony destruction.  Patchy areas of sclerosis are present C3-C6, most prominent C5 vertebral body.  This is most likely degenerative in nature however metastatic disease could have this appearance also.  Question prostate cancer.  C2-3:  Disc degeneration and spondylosis  C3-4:  Disc degeneration with spondylosis and mild O P L L.  Mild right facet degeneration and right foraminal encroachment  C4-5:  Disc degeneration with ossification of the posterior longitudinal ligament causing mild spinal stenosis and mild foraminal stenosis bilaterally  C5-6:  Disc degeneration with ossification of the posterior longitudinal ligament causing mild spinal stenosis.  Mild foraminal encroachment bilaterally  C6-7:  Disc degeneration and mild spondylosis.  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  Negative for fracture.  Patchy areas of vertebral body sclerosis, most likely degenerative however osteoblastic metastatic disease is a possibility.   Original Report Authenticated By: Janeece Riggers, M.D.   US Venous Img Lower  Unilateral Right  10/18/2012   *RADIOLOGY REPORT*  Clinical Data: Right lower extremity pain and edema.  RIGHT LOWER EXTREMITY VENOUS DUPLEX ULTRASOUND  Technique:  Gray-scale sonography with graded compression, as well as color Doppler and duplex ultrasound were performed to evaluate the deep venous system of the lower extremity from the level of the common femoral vein through the popliteal and proximal calf veins. Spectral Doppler was utilized to evaluate flow at rest and with distal augmentation maneuvers.  Comparison:  None.  Findings:  Normal compressibility of the common femoral, superficial femoral, and popliteal veins is demonstrated, as well as the visualized proximal calf veins.  No filling defects to suggest DVT on grayscale or color Doppler imaging.  Doppler waveforms show normal direction of venous flow, normal respiratory phasicity and response to augmentation.  No evidence of superficial thrombophlebitis or abnormal fluid collection.  IMPRESSION: No evidence of right lower extremity deep vein thrombosis.   Original Report Authenticated By: Irish Lack, M.D.   Dg Shoulder Left  10/15/2012   *RADIOLOGY REPORT*  Clinical Data: Left shoulder pain.  LEFT SHOULDER - 2+ VIEW  Comparison: None.  Findings: No evidence of acute fracture or dislocation.  Mild degenerative changes are seen involving the acromioclavicular joint.  No other significant bone abnormality identified.  IMPRESSION:  1.  No acute findings. 2.  Mild acromioclavicular degenerative changes.   Original Report Authenticated By: Myles Rosenthal, M.D.    Microbiology: Recent Results (from the past 240 hour(s))  URINE CULTURE     Status: None   Collection Time    10/15/12  2:28 PM      Result Value Range Status   Specimen Description URINE, CLEAN CATCH   Final   Special Requests NONE   Final   Culture  Setup Time 10/15/2012 21:36   Final   Colony Count 20,OOO COLONIES/ML   Final   Culture     Final   Value: Multiple bacterial  morphotypes present, none predominant. Suggest appropriate recollection if clinically indicated.   Report Status 10/16/2012 FINAL   Final  MRSA PCR SCREENING     Status: Abnormal   Collection Time    10/15/12  6:06 PM      Result Value Range Status   MRSA by PCR POSITIVE (*) NEGATIVE Final   Comment:            The GeneXpert MRSA Assay (FDA     approved for NASAL specimens     only), is one component of a     comprehensive MRSA colonization     surveillance  program. It is not     intended to diagnose MRSA     infection nor to guide or     monitor treatment for     MRSA infections.     RESULT CALLED TO, READ BACK BY AND VERIFIED WITH:     K. FAINT AT 2107 ON 10/15/12 BY Wynonia Lawman     Labs: Basic Metabolic Panel:  Recent Labs Lab 10/16/12 0026 10/16/12 0430 10/16/12 1213 10/18/12 0739 10/19/12 0542  NA 131* 132* 132* 132* 134*  K 3.0* 3.3* 3.5 3.8 3.7  CL 96 95* 99 95* 95*  CO2 18* 21 21 26 27   GLUCOSE 179* 118* 203* 261* 303*  BUN 4* 4* 4* 8 10  CREATININE 0.41* 0.47* 0.43* 0.40* 0.40*  CALCIUM 9.1 9.5 9.3 9.2 9.0   Liver Function Tests:  Recent Labs Lab 10/20/12 0522  AST 15  ALT 14  ALKPHOS 246*  BILITOT 0.3  PROT 7.2  ALBUMIN 1.8*    Recent Labs Lab 10/20/12 0522  LIPASE 33   No results found for this basename: AMMONIA,  in the last 168 hours CBC:  Recent Labs Lab 10/15/12 1520  10/17/12 0516 10/18/12 0739 10/19/12 0542 10/20/12 0522 10/21/12 0625  WBC 16.4*  < > 19.6* 20.9* 23.1* 19.7* 20.1*  NEUTROABS 14.0*  --   --  17.5*  --   --  16.4*  HGB 15.1  < > 14.0 13.0 13.8 13.7 13.9  HCT 43.3  < > 40.3 37.3* 39.7 39.2 40.2  MCV 83.9  < > 82.9 82.5 82.2 83.1 84.3  PLT 188  < > 190 180 201 235 265  < > = values in this interval not displayed. Cardiac Enzymes:  Recent Labs Lab 10/15/12 1520 10/19/12 0542  CKTOTAL  --  13  TROPONINI <0.30  --    BNP: BNP (last 3 results) No results found for this basename: PROBNP,  in the last 8760  hours CBG:  Recent Labs Lab 10/20/12 0720 10/20/12 1131 10/20/12 1722 10/20/12 2111 10/21/12 0721  GLUCAP 136* 332* 337* 260* 149*       Signed:  Blake Vetrano  [facility] 10/21/2012, 1:15 PM

## 2012-10-21 NOTE — Progress Notes (Signed)
pts sister called back, and stated that pt was unable to get walker from [employer] with insurance. I called [employer], and the representative, and asked her what had occurred, due to the fact that on our previous call I stated that I would have the pt pick up the walker on the way home. She states that she had told me that she was checking insurance eligibility, and I never received a call stating that pt couldn't receive walker, so pt was d/c. When he arrived to [place], his BC[place] of [place], & no one with the company could be reached, so [place] has no way to know how much would be covered by insurance, thus how much to bill pt. Pts options would be to purchase an economical walker for $59, or to purchase the $110 walker, and then present this to the insurance company and allow them to pay their portion. I also attempted to call [facility] to have a walker sent by them, but they are also unable to do so until Monday. I expressed to the pts sister that I sincerely feel that he needs a walker today due to his difficulty with ambulation, I highly encouraged them to purchase a walker today rather than wait until Monday. I attempted to call them back this afternoon to verify that their needs were met, but no one answered. Administrative Coordinator is aware of this situation, we do not have any walkers here that can be sent home with the patient. Sheryn Bison

## 2012-10-23 LAB — GLUCOSE, CAPILLARY: Glucose-Capillary: 275 mg/dL — ABNORMAL HIGH (ref 70–99)

## 2012-10-24 ENCOUNTER — Encounter (HOSPITAL_COMMUNITY): Payer: Self-pay | Admitting: Emergency Medicine

## 2012-10-24 ENCOUNTER — Emergency Department (HOSPITAL_COMMUNITY)
Admission: EM | Admit: 2012-10-24 | Discharge: 2012-10-27 | Disposition: A | Discharge status: Home / Self Care | Payer: BC Managed Care – PPO | Attending: Emergency Medicine | Admitting: Emergency Medicine

## 2012-10-24 DIAGNOSIS — M25473 Effusion, unspecified ankle: Secondary | ICD-10-CM | POA: Insufficient documentation

## 2012-10-24 DIAGNOSIS — E119 Type 2 diabetes mellitus without complications: Secondary | ICD-10-CM

## 2012-10-24 DIAGNOSIS — Z8719 Personal history of other diseases of the digestive system: Secondary | ICD-10-CM | POA: Insufficient documentation

## 2012-10-24 DIAGNOSIS — R739 Hyperglycemia, unspecified: Secondary | ICD-10-CM

## 2012-10-24 DIAGNOSIS — R259 Unspecified abnormal involuntary movements: Secondary | ICD-10-CM | POA: Insufficient documentation

## 2012-10-24 DIAGNOSIS — R609 Edema, unspecified: Secondary | ICD-10-CM

## 2012-10-24 DIAGNOSIS — Z862 Personal history of diseases of the blood and blood-forming organs and certain disorders involving the immune mechanism: Secondary | ICD-10-CM | POA: Insufficient documentation

## 2012-10-24 DIAGNOSIS — E1110 Type 2 diabetes mellitus with ketoacidosis without coma: Secondary | ICD-10-CM | POA: Insufficient documentation

## 2012-10-24 DIAGNOSIS — Z79899 Other long term (current) drug therapy: Secondary | ICD-10-CM | POA: Insufficient documentation

## 2012-10-24 DIAGNOSIS — E669 Obesity, unspecified: Secondary | ICD-10-CM | POA: Insufficient documentation

## 2012-10-24 DIAGNOSIS — R29898 Other symptoms and signs involving the musculoskeletal system: Secondary | ICD-10-CM

## 2012-10-24 DIAGNOSIS — Z8639 Personal history of other endocrine, nutritional and metabolic disease: Secondary | ICD-10-CM | POA: Insufficient documentation

## 2012-10-24 DIAGNOSIS — F17200 Nicotine dependence, unspecified, uncomplicated: Secondary | ICD-10-CM | POA: Insufficient documentation

## 2012-10-24 DIAGNOSIS — Z8739 Personal history of other diseases of the musculoskeletal system and connective tissue: Secondary | ICD-10-CM | POA: Insufficient documentation

## 2012-10-24 DIAGNOSIS — E1169 Type 2 diabetes mellitus with other specified complication: Secondary | ICD-10-CM

## 2012-10-24 DIAGNOSIS — M064 Inflammatory polyarthropathy: Secondary | ICD-10-CM | POA: Insufficient documentation

## 2012-10-24 DIAGNOSIS — R600 Localized edema: Secondary | ICD-10-CM

## 2012-10-24 DIAGNOSIS — Z794 Long term (current) use of insulin: Secondary | ICD-10-CM | POA: Insufficient documentation

## 2012-10-24 DIAGNOSIS — M6281 Muscle weakness (generalized): Secondary | ICD-10-CM | POA: Insufficient documentation

## 2012-10-24 DIAGNOSIS — M25471 Effusion, right ankle: Secondary | ICD-10-CM

## 2012-10-24 DIAGNOSIS — M25476 Effusion, unspecified foot: Secondary | ICD-10-CM | POA: Insufficient documentation

## 2012-10-24 LAB — URINALYSIS, ROUTINE W REFLEX MICROSCOPIC
Glucose, UA: 500 mg/dL — AB
Protein, ur: NEGATIVE mg/dL
Specific Gravity, Urine: 1.015 (ref 1.005–1.030)
pH: 6 (ref 5.0–8.0)

## 2012-10-24 LAB — BASIC METABOLIC PANEL
CO2: 27 mEq/L (ref 19–32)
Chloride: 93 mEq/L — ABNORMAL LOW (ref 96–112)
Glucose, Bld: 365 mg/dL — ABNORMAL HIGH (ref 70–99)
Sodium: 134 mEq/L — ABNORMAL LOW (ref 135–145)

## 2012-10-24 LAB — CBC WITH DIFFERENTIAL/PLATELET
Basophils Absolute: 0 10*3/uL (ref 0.0–0.1)
HCT: 41 % (ref 39.0–52.0)
Lymphocytes Relative: 5 % — ABNORMAL LOW (ref 12–46)
Lymphs Abs: 1.2 10*3/uL (ref 0.7–4.0)
Neutro Abs: 22 10*3/uL — ABNORMAL HIGH (ref 1.7–7.7)
Platelets: 370 10*3/uL (ref 150–400)
RBC: 4.85 MIL/uL (ref 4.22–5.81)
RDW: 15.1 % (ref 11.5–15.5)
WBC: 24.2 10*3/uL — ABNORMAL HIGH (ref 4.0–10.5)

## 2012-10-24 LAB — GLUCOSE, CAPILLARY
Glucose-Capillary: 345 mg/dL — ABNORMAL HIGH (ref 70–99)
Glucose-Capillary: 335 mg/dL — ABNORMAL HIGH (ref 70–99)

## 2012-10-24 LAB — URINE MICROSCOPIC-ADD ON

## 2012-10-24 MED ORDER — DIPHENHYDRAMINE HCL 25 MG PO TABS
25.0000 mg | ORAL_TABLET | Freq: Every day | ORAL | Status: DC | PRN
  Filled 2012-10-24: qty 2

## 2012-10-24 MED ORDER — FAMOTIDINE 20 MG PO TABS
10.0000 mg | ORAL_TABLET | Freq: Every day | ORAL | Status: DC
  Administered 2012-10-24 – 2012-10-27 (×4): 10 mg via ORAL
  Filled 2012-10-24: qty 1
  Filled 2012-10-24: qty 2
  Filled 2012-10-24 (×2): qty 1

## 2012-10-24 MED ORDER — DICLOFENAC SODIUM 1 % TD GEL: 2.0000 g | Freq: Three times a day (TID) | TRANSDERMAL | Status: DC | PRN

## 2012-10-24 MED ORDER — ONDANSETRON HCL 4 MG PO TABS: 4.0000 mg | ORAL_TABLET | Freq: Three times a day (TID) | ORAL | Status: DC | PRN

## 2012-10-24 MED ORDER — IBUPROFEN 400 MG PO TABS: 600.0000 mg | ORAL_TABLET | Freq: Three times a day (TID) | ORAL | Status: DC | PRN

## 2012-10-24 MED ORDER — ACETAMINOPHEN 325 MG PO TABS: 650.0000 mg | ORAL_TABLET | ORAL | Status: DC | PRN

## 2012-10-24 MED ORDER — HYDROCODONE-ACETAMINOPHEN 5-325 MG PO TABS
1.0000 | ORAL_TABLET | ORAL | Status: DC | PRN
  Administered 2012-10-25 – 2012-10-27 (×9): 1 via ORAL
  Filled 2012-10-24 (×9): qty 1

## 2012-10-24 MED ORDER — METFORMIN HCL 500 MG PO TABS
500.0000 mg | ORAL_TABLET | Freq: Two times a day (BID) | ORAL | Status: DC
  Administered 2012-10-25 – 2012-10-27 (×5): 500 mg via ORAL
  Filled 2012-10-24 (×5): qty 1

## 2012-10-24 MED ORDER — INSULIN ASPART 100 UNIT/ML ~~LOC~~ SOLN
0.0000 [IU] | Freq: Three times a day (TID) | SUBCUTANEOUS | Status: DC
  Administered 2012-10-24: 7 [IU] via SUBCUTANEOUS
  Filled 2012-10-24: qty 1

## 2012-10-24 MED ORDER — SODIUM CHLORIDE 0.9 % IV BOLUS (SEPSIS)
1000.0000 mL | Freq: Once | INTRAVENOUS | Status: AC
  Administered 2012-10-24: 1000 mL via INTRAVENOUS

## 2012-10-24 MED ORDER — NICOTINE 21 MG/24HR TD PT24
21.0000 mg | MEDICATED_PATCH | Freq: Every day | TRANSDERMAL | Status: DC
  Administered 2012-10-24: 21 mg via TRANSDERMAL
  Filled 2012-10-24: qty 1

## 2012-10-24 MED ORDER — ZOLPIDEM TARTRATE 5 MG PO TABS
5.0000 mg | ORAL_TABLET | Freq: Every evening | ORAL | Status: DC | PRN
  Administered 2012-10-24 – 2012-10-25 (×2): 5 mg via ORAL
  Filled 2012-10-24 (×2): qty 1

## 2012-10-24 MED ORDER — INSULIN GLARGINE 100 UNIT/ML ~~LOC~~ SOLN
20.0000 [IU] | Freq: Every day | SUBCUTANEOUS | Status: DC
  Administered 2012-10-24 – 2012-10-26 (×3): 20 [IU] via SUBCUTANEOUS
  Filled 2012-10-24 (×6): qty 0.2

## 2012-10-24 NOTE — ED Provider Notes (Signed)
Medical screening examination/treatment/procedure(s) were conducted as a shared visit with non-physician practitioner(s) and myself.  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 SNF recommended but could not afford. States weakness in R arm and L leg is about the same. Has outpatient followup with neuro.  Dr. Thad Ranger agrees nothing to add today. Unable to walk and care for self at home. SW consulted for placement assistance.  Glynn Octave, MD 10/24/12 2206

## 2012-10-24 NOTE — ED Provider Notes (Signed)
History    CSN: [id number] Arrival date & time 10/24/12  1146  First MD Initiated Contact with Patient 10/24/12 1202     Chief Complaint  Patient presents with  . Foot Pain   (Consider location/radiation/quality/duration/timing/severity/associated sxs/prior Treatment) HPI Comments: Patient with recent diagnosis of diabetes, admission for DKA and extremity weakness -- presents do to "feeling bad" and worsening swelling of his right foot and ankle. While recently hospitalized at [facility] it was noted that the patient was having difficulty walking due to joint pain. He also had isolated weakness of right upper arm. He was seen by a neurologist who was concerned that weakness was multifactorial. He was discharged from the hospital with plans to do continued outpatient testing. He was discharged home on prednisone taper. He has been taking all of his medications except for the insulin. This morning the patient noted his right foot and ankle to be swollen again. He was also having an episode of shaking without loss of consciousness per his family. EMS was called and he was found to have elevated blood sugar. Onset of symptoms gradual. Course is constant. Nothing makes symptoms better or worse.  Patient is a 52 y.o. male presenting with lower extremity pain. The history is provided by the patient, medical records and a relative.  Foot Pain Associated symptoms include fatigue and weakness. Pertinent negatives include no abdominal pain, chest pain, coughing, fever, headaches, myalgias, nausea, neck pain, rash, sore throat or vomiting.   Past Medical History  Diagnosis Date  . DM (diabetes mellitus)   . Unintentional weight loss 10/16/2012  . DM (diabetes mellitus) type 2, uncontrolled, with ketoacidosis 10/16/2012    DKA  . Gout     Question diagnosis  . Protein-calorie malnutrition, severe 10/20/2012  . Cervical stenosis of spine 10/20/2012    Mild per CT  . Right inguinal hernia 10/20/2012     Large, without obstruction  . Cholelithiasis 10/20/2012  . 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 Chronic inflammatory arthritis 10/21/2012  . Pain, joint, multiple sites    Past Surgical History  Procedure Laterality Date  . No past surgeries     Family History  Problem Relation Age of Onset  . Arthritis    . Diabetes Brother   . Diabetes Brother    History  Substance Use Topics  . Smoking status: Current Every Day Smoker -- 1.00 packs/day  . Smokeless tobacco: Not on file  . Alcohol Use: 0.6 oz/week    1 Cans of beer per week     Comment: occassional drinker    Review of Systems  Constitutional: Positive for fatigue. Negative for fever.  HENT: Negative for sore throat, rhinorrhea and neck pain.   Eyes: Negative for redness.  Respiratory: Negative for cough.   Cardiovascular: Negative for chest pain.  Gastrointestinal: Negative for nausea, vomiting, abdominal pain and diarrhea.  Genitourinary: Negative for dysuria.  Musculoskeletal: Negative for myalgias.  Skin: Negative for rash.  Neurological: Positive for tremors and weakness. Negative for headaches.    Allergies  Bee venom and Shellfish allergy  Home Medications   Current Outpatient Rx  Name  Route  Sig  Dispense  Refill  . diclofenac sodium (VOLTAREN) 1 % GEL   Topical   Apply 2 g topically 3 (three) times daily as needed (arthritis pain). For arthritis pain.         . diphenhydrAMINE (BENADRYL) 25 MG tablet   Oral   Take 25-50 mg by mouth daily  as needed. For itching and allergy symptoms         . famotidine (PEPCID) 10 MG tablet   Oral   Take 1 tablet (10 mg total) by mouth daily.         Marland Kitchen HYDROcodone-acetaminophen (NORCO/VICODIN) 5-325 MG per tablet   Oral   Take 1 tablet by mouth every 4 (four) hours as needed for pain.         Marland Kitchen insulin glargine (LANTUS) 100 UNIT/ML injection   Subcutaneous   Inject 0.2 mLs (20 Units total) into the skin at bedtime.   10 mL    12   . metFORMIN (GLUCOPHAGE) 500 MG tablet   Oral   Take 500 mg by mouth 2 (two) times daily with a meal.         . Multiple Vitamin (MULTIVITAMIN WITH MINERALS) TABS   Oral   Take 1 tablet by mouth daily.         . nicotine (NICODERM CQ - DOSED IN MG/24 HOURS) 14 mg/24hr patch      Use as directed per label instructions.         . predniSONE (DELTASONE) 10 MG tablet      Starting tomorrow, take 5 tablets for 2 days; then 4 tablets for 2 days; then 3 tablets for 2 days; then 2 tablets for 2 days; then 1 tablet for 2 days; then a half a tablet thereafter. (For treatment of your inflammatory arthritis).   45 tablet   3    BP 140/69  Pulse 97  Temp(Src) 98.6 F (37 C) (Oral)  Resp 14  SpO2 99% Physical Exam  Nursing note and vitals reviewed. Constitutional: He appears well-developed and well-nourished.  HENT:  Head: Normocephalic and atraumatic.  Eyes: Conjunctivae are normal. Right eye exhibits no discharge. Left eye exhibits no discharge.  Neck: Normal range of motion. Neck supple.  Cardiovascular: Normal rate, regular rhythm and normal heart sounds.   Pulmonary/Chest: Effort normal and breath sounds normal.  Abdominal: Soft. There is no tenderness.  Musculoskeletal: He exhibits edema and tenderness.  Generalized swelling of right foot and ankle without overlying erythema. Mild tenderness. Mild warmth.  Neurological: He is alert. No cranial nerve deficit or sensory deficit. GCS eye subscore is 4. GCS verbal subscore is 5. GCS motor subscore is 6.  Significantly decreased strength with flexion of right elbow. Normal strength with extension. Significantly decreased strength with abduction of shoulder. Normal abduction. Normal wrist and hand strength.  Skin: Skin is warm and dry.  Psychiatric: He has a normal mood and affect.    ED Course  Procedures (including critical care time) Labs Reviewed  CBC WITH DIFFERENTIAL - Abnormal; Notable for the following:    WBC  24.2 (*)    Neutrophils Relative % 91 (*)    Neutro Abs 22.0 (*)    Lymphocytes Relative 5 (*)    All other components within normal limits  BASIC METABOLIC PANEL - Abnormal; Notable for the following:    Sodium 134 (*)    Chloride 93 (*)    Glucose, Bld 365 (*)    All other components within normal limits  URINALYSIS, ROUTINE W REFLEX MICROSCOPIC - Abnormal; Notable for the following:    APPearance CLOUDY (*)    Glucose, UA 500 (*)    Ketones, ur 40 (*)    Urobilinogen, UA 2.0 (*)    Leukocytes, UA SMALL (*)    All other components within normal limits  GLUCOSE, CAPILLARY -  Abnormal; Notable for the following:    Glucose-Capillary 345 (*)    All other components within normal limits  URINE MICROSCOPIC-ADD ON - Abnormal; Notable for the following:    Squamous Epithelial / LPF FEW (*)    All other components within normal limits   No results found. 1. Diabetes mellitus type 2 in obese   2. Leg edema, right     12:52 PM Patient seen and examined. Work-up initiated. Fluids ordered.   Vital signs reviewed and are as follows: Filed Vitals:   10/24/12 1256  BP: 140/69  Pulse: 97  Temp: 98.6 F (37 C)  Resp: 14   5:53 PM During ED stay, labs reveal hyperglycemia but no DKA today.   Neuro consult order given deficits. Recommedations appreciated. Agree this is peripheral.   Family cannot care for patient at home. He cannot walk. He needs aggressive PT/OT and blood sugar control.   Case manager has seen patient. Apparently the trouble with the co-pay comes from one place the patient desired to go. However patient may be able to be placed elsewhere and family will accept placement in [place].   Patient will be in ED overnight. Holding orders completed. Case manager and myself have ordered PT/OT eval to be done in the morning. SW has begun search for placement here in [place]. The hope is patient will be able to be placed tomorrow.   MDM  Patient holding in ED for skilled  rehab placement.   Renne Crigler, PA-C 10/24/12 2006

## 2012-10-24 NOTE — ED Notes (Signed)
Discussed case with Burna Mortimer, [facility].  Pt recently d/c from [facility] to home.  Pt/family unable to manage his care because he is non-ambulatory.  Pt had bed offer while at [facility], but couldn't afford upfront costs at the facility.  Pt wants to look into placement in [place] for more placement options.  [place] faxed to both [place] and [place].  Await bed offers.  PT/OT c/s pending.  Pt will most likely need prior auth from his insurance carrier prior to d/c to SNF.  Dayshift CSW to follow.

## 2012-10-24 NOTE — Consult Note (Signed)
Reason for Consult:RUE and BLE weakness Referring Physician: Rancour  CC: RUE and BLE weakness  HPI: Luke [name] is an 52 y.o. male  That initially presented to [facility] because he was not feeling well.  Noted weakness in his LLE andhip pain.  Patient was admitted.  Became lethargic and when he was more with it noted RUE and RLE weakness as well with right shoulder pain and right foot pain.  Patient was evaluated by Dr. Gerilyn Pilgrim and diagnosed with an inflammatory arthropathy and Parsonage Turner Syndrome.  The patient was started on steroids.  He was not able to have inpatient rehab secondary to the deductible.  He was scheduled for an outpatient NCV/EMG on an outpatient basis.   Patient presents here today because he continues to feel poorly and is unable to ambulate.    Past Medical History  Diagnosis Date  . DM (diabetes mellitus)   . Unintentional weight loss 10/16/2012  . DM (diabetes mellitus) type 2, uncontrolled, with ketoacidosis 10/16/2012    DKA  . Gout     Question diagnosis  . Protein-calorie malnutrition, severe 10/20/2012  . Cervical stenosis of spine 10/20/2012    Mild per CT  . Right inguinal hernia 10/20/2012    Large, without obstruction  . Cholelithiasis 10/20/2012  . 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 Chronic inflammatory arthritis 10/21/2012  . Pain, joint, multiple sites     Past Surgical History  Procedure Laterality Date  . No past surgeries      Family History  Problem Relation Age of Onset  . Arthritis    . Diabetes Brother   . Diabetes Brother     Social History:  reports that he has been smoking.  He does not have any smokeless tobacco history on file. He reports that he drinks about 0.6 ounces of alcohol per week. He reports that he does not use illicit drugs.  Allergies  Allergen Reactions  . Bee Venom Swelling  . Shellfish Allergy Nausea And Vomiting    Medications: I have reviewed the patient's current  medications. Prior to Admission:   Current outpatient prescriptions:diclofenac sodium (VOLTAREN) 1 % GEL, Apply 2 g topically 3 (three) times daily as needed (arthritis pain). For arthritis pain., Disp: , Rfl: ;  diphenhydrAMINE (BENADRYL) 25 MG tablet, Take 25-50 mg by mouth daily as needed. For itching and allergy symptoms, Disp: , Rfl: ;  famotidine (PEPCID) 10 MG tablet, Take 1 tablet (10 mg total) by mouth daily., Disp: , Rfl:  HYDROcodone-acetaminophen (NORCO/VICODIN) 5-325 MG per tablet, Take 1 tablet by mouth every 4 (four) hours as needed for pain., Disp: , Rfl: ;  insulin glargine (LANTUS) 100 UNIT/ML injection, Inject 0.2 mLs (20 Units total) into the skin at bedtime., Disp: 10 mL, Rfl: 12;  metFORMIN (GLUCOPHAGE) 500 MG tablet, Take 500 mg by mouth 2 (two) times daily with a meal., Disp: , Rfl:  Multiple Vitamin (MULTIVITAMIN WITH MINERALS) TABS, Take 1 tablet by mouth daily., Disp: , Rfl: ;  nicotine (NICODERM CQ - DOSED IN MG/24 HOURS) 14 mg/24hr patch, Use as directed per label instructions., Disp: , Rfl:  predniSONE (DELTASONE) 10 MG tablet, Starting tomorrow, take 5 tablets for 2 days; then 4 tablets for 2 days; then 3 tablets for 2 days; then 2 tablets for 2 days; then 1 tablet for 2 days; then a half a tablet thereafter. (For treatment of your inflammatory arthritis)., Disp: 45 tablet, Rfl: 3  ROS: History obtained from the patient  General ROS: negative for - chills, fatigue, fever, night sweats, weight gain or weight loss Psychological ROS: negative for - behavioral disorder, hallucinations, memory difficulties, mood swings or suicidal ideation Ophthalmic ROS: negative for - blurry vision, double vision, eye pain or loss of vision ENT ROS: negative for - epistaxis, nasal discharge, oral lesions, sore throat, tinnitus or vertigo Allergy and Immunology ROS: negative for - hives or itchy/watery eyes Hematological and Lymphatic ROS: negative for - bleeding problems, bruising or  swollen lymph nodes Endocrine ROS: negative for - galactorrhea, hair pattern changes, polydipsia/polyuria or temperature intolerance Respiratory ROS: negative for - cough, hemoptysis, shortness of breath or wheezing Cardiovascular ROS: RLE edema Gastrointestinal ROS: negative for - abdominal pain, diarrhea, hematemesis, nausea/vomiting or stool incontinence Genito-Urinary ROS: negative for - dysuria, hematuria, incontinence or urinary frequency/urgency Musculoskeletal ROS: as noted in HPI Neurological ROS: as noted in HPI Dermatological ROS: negative for rash and skin lesion changes  Physical Examination: Blood pressure 140/69, pulse 97, temperature 98.6 F (37 C), temperature source Oral, resp. rate 14, SpO2 99.00%.  Neurologic Examination Mental Status: Alert, oriented, thought content appropriate.  Speech fluent without evidence of aphasia.  Able to follow 3 step commands without difficulty. Cranial Nerves: II: Discs flat bilaterally; Visual fields grossly normal, pupils equal, round, reactive to light and accommodation III,IV, VI: ptosis not present, extra-ocular motions intact bilaterally V,VII: smile symmetric, facial light touch sensation normal bilaterally VIII: hearing normal bilaterally IX,X: gag reflex present XI: bilateral shoulder shrug XII: midline tongue extension Motor: Right : Upper extremity   1/5 abduction, 3/5 biceps, 5/5 triceps, 5/5 hand grip   Lower extremity   5/5 throughout w 5-/5 plantar extension secondary to ankle pain.  Extremity edematous    Left:     Upper extremity   5/5  Lower extremity   Limited proximally secondary to pain, gives full strength distally Tone and bulk:normal tone throughout; no atrophy noted Sensory: Pinprick and light touch intact throughout, bilaterally Deep Tendon Reflexes: 2+ in the upper extremities, 2+ left knee jerk, 1+ right knee jerk and absent AJ's bilaterally Plantars: Right: downgoing   Left:  downgoing Cerebellar: normal finger-to-nose on the left Gait: Unable to test CV: pulses palpable throughout   Laboratory Studies:   Basic Metabolic Panel:  Recent Labs Lab 10/18/12 0739 10/19/12 0542 10/24/12 1252  NA 132* 134* 134*  K 3.8 3.7 4.0  CL 95* 95* 93*  CO2 26 27 27   GLUCOSE 261* 303* 365*  BUN 8 10 12   CREATININE 0.40* 0.40* 0.56  CALCIUM 9.2 9.0 9.3    Liver Function Tests:  Recent Labs Lab 10/20/12 0522  AST 15  ALT 14  ALKPHOS 246*  BILITOT 0.3  PROT 7.2  ALBUMIN 1.8*    Recent Labs Lab 10/20/12 0522  LIPASE 33   No results found for this basename: AMMONIA,  in the last 168 hours  CBC:  Recent Labs Lab 10/18/12 0739 10/19/12 0542 10/20/12 0522 10/21/12 0625 10/24/12 1252  WBC 20.9* 23.1* 19.7* 20.1* 24.2*  NEUTROABS 17.5*  --   --  16.4* 22.0*  HGB 13.0 13.8 13.7 13.9 14.3  HCT 37.3* 39.7 39.2 40.2 41.0  MCV 82.5 82.2 83.1 84.3 84.5  PLT 180 201 235 265 370    Cardiac Enzymes:  Recent Labs Lab 10/19/12 0542  CKTOTAL 13    BNP: No components found with this basename: POCBNP,   CBG:  Recent Labs Lab 10/20/12 1722 10/20/12 2111 10/21/12 0721 10/21/12 1152 10/24/12 1258  GLUCAP  337* 260* 149* 275* 345*    Microbiology: Results for orders placed during the hospital encounter of 10/15/12  URINE CULTURE     Status: None   Collection Time    10/15/12  2:28 PM      Result Value Range Status   Specimen Description URINE, CLEAN CATCH   Final   Special Requests NONE   Final   Culture  Setup Time 10/15/2012 21:36   Final   Colony Count 20,OOO COLONIES/ML   Final   Culture     Final   Value: Multiple bacterial morphotypes present, none predominant. Suggest appropriate recollection if clinically indicated.   Report Status 10/16/2012 FINAL   Final  MRSA PCR SCREENING     Status: Abnormal   Collection Time    10/15/12  6:06 PM      Result Value Range Status   MRSA by PCR POSITIVE (*) NEGATIVE Final   Comment:             The GeneXpert MRSA Assay (FDA     approved for NASAL specimens     only), is one component of a     comprehensive MRSA colonization     surveillance program. It is not     intended to diagnose MRSA     infection nor to guide or     monitor treatment for     MRSA infections.     RESULT CALLED TO, READ BACK BY AND VERIFIED WITH:     K. FAINT AT 2107 ON 10/15/12 BY Wynonia Lawman    Coagulation Studies: No results found for this basename: LABPROT, INR,  in the last 72 hours  Urinalysis:  Recent Labs Lab 10/24/12 1302  COLORURINE YELLOW  LABSPEC 1.015  PHURINE 6.0  GLUCOSEU 500*  HGBUR NEGATIVE  BILIRUBINUR NEGATIVE  KETONESUR 40*  PROTEINUR NEGATIVE  UROBILINOGEN 2.0*  NITRITE NEGATIVE  LEUKOCYTESUR SMALL*    Lipid Panel:  No results found for this basename: chol, trig, hdl, cholhdl, vldl, ldlcalc    HgbA1C:  Lab Results  Component Value Date   HGBA1C 15.9* 10/16/2012    Urine Drug Screen:   No results found for this basename: labopia, cocainscrnur, labbenz, amphetmu, thcu, labbarb    Alcohol Level: No results found for this basename: ETH,  in the last 168 hours  Imaging: No results found.   Assessment/Plan: 52 year old male with joint pain and RUE, RLE, LLE weakness.  Patient has been evaluated by Dr. Gerilyn Pilgrim and I do agree with him that this is peripheral.  It is likely related to his poorly controlled blood sugar.  Due to this and the fact that the steroids have not been helpful it is most likely not to his advantage to continue with steroids.  Therapy is likely to be of the most benefit and the patient is scheduled for an outpatient NCV/EMG as well.  In the meantime the patient has some disposition issues.  He is unable to ambulate and difficult for his family to manage.    Recommendations: 1.  PT/OT 2.  Discontinue steroids 3.  BS control 4.  Disposition assistance 5.  Agree with outpatient NCV/EMG  Thana Farr, MD [facility] [phone] 10/24/2012, 3:31 PM

## 2012-10-24 NOTE — ED Notes (Signed)
CBG 302  

## 2012-10-24 NOTE — ED Notes (Signed)
Pt brought to ED with pain in the rt foot.

## 2012-10-24 NOTE — Progress Notes (Signed)
   CARE MANAGEMENT ED NOTE 10/24/2012  Patient:  Luke [name], Luke [name]   Account Number:  [id number]  Date Initiated:  10/24/2012  Documentation initiated by:  [facility]  Subjective/Objective Assessment:   presented to ED with Foot Pain     Subjective/Objective Assessment Detail:     Action/Plan:   Social Work Referral for Placement   Action/Plan Detail:   Anticipated DC Date:       Status Recommendation to Physician:   Result of Recommendation:    Other ED Services  Consult Working Plan    DC Planning Services  CM consult    Choice offered to / List presented to: patient and family of SNF in [place]           Status of service:  Completed, signed off  ED Comments:   ED Comments Detail:  ED CM consult noted in POD E-3.  In to speak with patient and family with patient's permission.  He presents to the ED with complaints of severe foot pain. Pt stated, he was recently discharged on Saturday 10/22/12 from [facility] with [facility], PT services. Pt stated that SW at [facility] was involved with placement at but family, was unable to come up with  $700 out of pocket payment.  Pt stays with sister and father in [place].  Pt was Recently diagnosed type 2 diabetes mellitus and  complaints of neck pain, shoulder pain, leg pain, ankle pain, which he states has made it difficult to walk.  Pt and family states,that they are unable to provide the care that is needed for him. His sister states she works full-time, and dad is elderly. Provided patient and family with list of SNF's in [facility] as per their request.  Spoke with [place] PA in regards to safe discharge plan. Pt has [employer] PT/OT eval ordered. Notified ED SW for referral for placement. Will update [facility]. No further CM needs identified.   Beecher Mcardle RN BSN

## 2012-10-25 LAB — GLUCOSE, CAPILLARY
Glucose-Capillary: 235 mg/dL — ABNORMAL HIGH (ref 70–99)
Glucose-Capillary: 248 mg/dL — ABNORMAL HIGH (ref 70–99)

## 2012-10-25 MED ORDER — INSULIN ASPART 100 UNIT/ML ~~LOC~~ SOLN
0.0000 [IU] | Freq: Three times a day (TID) | SUBCUTANEOUS | Status: DC
  Administered 2012-10-25: 8 [IU] via SUBCUTANEOUS
  Administered 2012-10-25: 5 [IU] via SUBCUTANEOUS
  Administered 2012-10-26: 3 [IU] via SUBCUTANEOUS
  Administered 2012-10-26 (×2): 5 [IU] via SUBCUTANEOUS
  Administered 2012-10-27: 2 [IU] via SUBCUTANEOUS
  Filled 2012-10-25 (×2): qty 1

## 2012-10-25 MED ORDER — INSULIN ASPART 100 UNIT/ML ~~LOC~~ SOLN
0.0000 [IU] | Freq: Every day | SUBCUTANEOUS | Status: DC
Start: 2012-10-25 — End: 2012-10-27
  Administered 2012-10-25: 4 [IU] via SUBCUTANEOUS
  Administered 2012-10-26: 3 [IU] via SUBCUTANEOUS
  Filled 2012-10-25 (×6): qty 1

## 2012-10-25 MED ORDER — PREDNISONE 20 MG PO TABS
40.0000 mg | ORAL_TABLET | Freq: Once | ORAL | Status: AC
  Administered 2012-10-25: 40 mg via ORAL
  Filled 2012-10-25: qty 2

## 2012-10-25 MED ORDER — PREDNISONE 20 MG PO TABS: 20.0000 mg | ORAL_TABLET | Freq: Every morning | ORAL | Status: DC

## 2012-10-25 MED ORDER — NICOTINE 14 MG/24HR TD PT24
14.0000 mg | MEDICATED_PATCH | Freq: Every day | TRANSDERMAL | Status: DC
  Administered 2012-10-25: 14 mg via TRANSDERMAL
  Filled 2012-10-25 (×3): qty 1

## 2012-10-25 MED ORDER — PREDNISONE 5 MG PO TABS: 5.0000 mg | ORAL_TABLET | Freq: Every day | ORAL | Status: DC

## 2012-10-25 MED ORDER — PREDNISONE 20 MG PO TABS: 10.0000 mg | ORAL_TABLET | Freq: Every morning | ORAL | Status: DC

## 2012-10-25 MED ORDER — PREDNISONE 20 MG PO TABS
30.0000 mg | ORAL_TABLET | Freq: Every morning | ORAL | Status: AC
  Administered 2012-10-26 – 2012-10-27 (×2): 30 mg via ORAL
  Filled 2012-10-25 (×2): qty 2

## 2012-10-25 MED ORDER — INSULIN ASPART 100 UNIT/ML ~~LOC~~ SOLN
0.0000 [IU] | Freq: Three times a day (TID) | SUBCUTANEOUS | Status: DC
  Administered 2012-10-25: 5 [IU] via SUBCUTANEOUS
  Filled 2012-10-25: qty 1

## 2012-10-25 NOTE — Progress Notes (Signed)
CSW provided list of bed offers for SNF facilities.   Pt wanted CSW to check with facility to assess if there is any out of pocket expense so that he can know what financial obligation was needed for placement. Pt stated he has limited funds.   CSW contacted Bonita Quin at [employer] [place] to assess cost. Bonita Quin will contact CSW back concerning cost.   CSW to follow for placement.     Luke [name], LCSWA [facility]  [phone]

## 2012-10-25 NOTE — ED Notes (Signed)
Case manager to page social worker to let her know the family is here

## 2012-10-25 NOTE — ED Notes (Signed)
Social worker has called back and is still waiting for the money totals from [facility]. Pt family has left

## 2012-10-25 NOTE — ED Provider Notes (Signed)
Patient seen in AM. Patient comfortable this AM. CBG still running in the 300s. Eating normally as per nursing. He is on lantus 20 U QHS and metformin and on sensitive sliding scale. Will increase sliding scale for better glycemia control. May need to adjust lantus dose if he requires more sliding scale.   Richardean Canal, MD 10/25/12 [phone]

## 2012-10-25 NOTE — Progress Notes (Signed)
Physical Therapy Evaluation Note  Past Medical History  Diagnosis Date  . DM (diabetes mellitus)   . Unintentional weight loss 10/16/2012  . DM (diabetes mellitus) type 2, uncontrolled, with ketoacidosis 10/16/2012    DKA  . Gout     Question diagnosis  . Protein-calorie malnutrition, severe 10/20/2012  . Cervical stenosis of spine 10/20/2012    Mild per CT  . Right inguinal hernia 10/20/2012    Large, without obstruction  . Cholelithiasis 10/20/2012  . 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 Chronic inflammatory arthritis 10/21/2012  . Pain, joint, multiple sites    Past Surgical History  Procedure Laterality Date  . No past surgeries       10/25/12 0805  PT Visit Information  Last PT Received On 10/25/12  Assistance Needed +2  PT/OT Co-Evaluation/Treatment Yes  Reason Eval/Treat Not Completed Pain limiting ability to participate  History of Present Illness Pt admitted with R ankle pain and L hip/LE pain  Precautions  Precautions Fall  Restrictions  Weight Bearing Restrictions No  Home Living  Family/patient expects to be discharged to: Skilled nursing facility  Additional Comments pt was living with brother however is now non-ambulatory due to onset of bilat LE  pain. Brother works during the day and is unable to provide appropriate assist to patient  Prior Function  Level of Independence Independent  Comments was indep. until this past sunday  Communication  Communication No difficulties  Cognition  Arousal/Alertness Awake/alert  Behavior During Therapy WFL for tasks assessed/performed  Overall Cognitive Status Within Functional Limits for tasks assessed  Upper Extremity Assessment  Upper Extremity Assessment Defer to OT evaluation;RUE deficits/detail (noted weakness in supinators, biceps and shld flexion)  Lower Extremity Assessment  Lower Extremity Assessment RLE deficits/detail;LLE deficits/detail  RLE Deficits / Details grossly 3-/5  LLE  Deficits / Details grossly 3-/5 due to pain  Cervical / Trunk Assessment  Cervical / Trunk Assessment Normal  Bed Mobility  Bed Mobility Not assessed (pt up in chair upon arrival)  Transfers  Transfers Sit to Stand;Stand to Sit  Sit to Stand 1: +2 Total assist;With upper extremity assist;From chair/3-in-1;With armrests  Sit to Stand: Patient Percentage 40%  Stand to Sit 1: +2 Total assist;With upper extremity assist;To chair/3-in-1;With armrests  Stand to Sit: Patient Percentage 30%  Details for Transfer Assistance pt unable to achieve full upright posture. pt tolerated 1 min of standing prior to reporting "my ankle is going to give out."  Ambulation/Gait  Ambulation/Gait Assistance Not tested (comment) (pt unable/unsafe at this time)  Static Standing Balance  Static Standing - Level of Assistance 1: +2 Total assist (60 sec, max assist to achieve bilat hip ext)  PT - End of Session  Equipment Utilized During Treatment Gait belt  Activity Tolerance Patient limited by pain;Patient limited by fatigue  Patient left in chair;with call bell/phone within reach  Nurse Communication Need for lift equipment;Mobility status (use sara steady)  PT Assessment  PT Recommendation/Assessment Patient needs continued PT services  PT Problem List Decreased strength;Decreased range of motion;Decreased activity tolerance;Decreased mobility;Decreased knowledge of use of DME;Obesity;Pain  Barriers to Discharge Inaccessible home environment;Decreased caregiver support  Barriers to Discharge Comments alone during the day, 2 steps to enter home  PT Therapy Diagnosis  Difficulty walking;Abnormality of gait;Generalized weakness;Acute pain  PT Plan  PT Frequency Min 3X/week  PT Treatment/Interventions Gait training;Functional mobility training;Stair training;Therapeutic activities;Therapeutic exercise;Patient/family education  PT Recommendation  Follow Up Recommendations SNF  PT equipment (TBA)  Individuals  Consulted  Consulted and Agree with Results and Recommendations Patient  Acute Rehab PT Goals  Patient Stated Goal get back to work  PT Goal Formulation With patient  Time For Goal Achievement 11/08/12  Potential to Achieve Goals Good  PT Time Calculation  PT Start Time 0805  PT Stop Time 0825  PT Time Calculation (min) 20 min  PT G-Codes **NOT FOR INPATIENT CLASS**  Functional Assessment Tool Used clinical judement  Functional Limitation Mobility: Walking and moving around  Mobility: Walking and Moving Around Current Status (Z6109) CM  Mobility: Walking and Moving Around Goal Status (U0454) CJ  PT General Charges  $$ ACUTE PT VISIT 1 Procedure  PT Evaluation  $Initial PT Evaluation Tier I 1 Procedure  PT Treatments  $Therapeutic Activity 8-22 mins  Written Expression  Dominant Hand Right   Pain: pain in R ankle and L hip  Lewis Shock, PT, DPT Pager #: [phone] Office #: [phone]

## 2012-10-25 NOTE — Evaluation (Signed)
Occupational Therapy Evaluation Patient Details Name: RAND ETCHISON MRN: [id number] DOB: [birth year] Today's Date: 10/25/2012 Time: 4696-2952 OT Time Calculation (min): 20 min  OT Assessment / Plan / Recommendation Clinical Impression  This 52 yo male admitted with BIL LE weakness,, RUE weakness, and R ankle pain presents with work up still in progress as to cause presents to acute OT with problems below. WIll benefit from acute OT with follow up at SNF.    OT Assessment  Patient needs continued OT Services    Follow Up Recommendations  SNF    Barriers to Discharge Decreased caregiver support    Equipment Recommendations   (TBD next venue)       Frequency  Min 2X/week    Precautions / Restrictions Precautions Precautions: Fall Restrictions Weight Bearing Restrictions: No   Pertinent Vitals/Pain R ankle pain, could only stand about 1 minute before he had to sit due to pain    ADL  Eating/Feeding: Simulated;Set up (eating with his non-dominant L hand) Where Assessed - Eating/Feeding: Chair Grooming: Minimal assistance (with non-dominant hand) Where Assessed - Grooming: Supported sitting Upper Body Bathing: Simulated;Moderate assistance Where Assessed - Upper Body Bathing: Supported sitting Lower Body Bathing: +1 Total assistance Where Assessed - Lower Body Bathing: Supported sit to stand (+2 sit to stand and maintain standing) Upper Body Dressing: Simulated;+1 Total assistance Where Assessed - Upper Body Dressing: Supported sitting Lower Body Dressing: Simulated;+1 Total assistance Where Assessed - Lower Body Dressing: Supported sit to stand (+2 to stand and maintain standing) Equipment Used: Gait belt;Rolling walker Transfers/Ambulation Related to ADLs: total A +2 (pt=40%) sit to partial stand from recliner; total A +2 (pt=20%) stand to sit to recliner    OT Diagnosis: Generalized weakness;Acute pain  OT Problem List: Decreased strength;Decreased range of  motion;Decreased activity tolerance;Impaired balance (sitting and/or standing);Obesity;Pain;Impaired UE functional use OT Treatment Interventions: Self-care/ADL training;Therapeutic exercise;Patient/family education;Balance training;Therapeutic activities   OT Goals(Current goals can be found in the care plan section) Acute Rehab OT Goals Patient Stated Goal: Be able to walk again OT Goal Formulation: With patient Time For Goal Achievement: 11/08/12 Potential to Achieve Goals: Good  Visit Information  Last OT Received On: 10/25/12 Assistance Needed: +2 History of Present Illness: Pt admitted with R ankle pain and L hip/LE pain       Prior Functioning     Home Living Family/patient expects to be discharged to:: Skilled nursing facility Additional Comments: pt was living with brother however is now non-ambulatory due to onset of bilat LE  pain. Brother works during the day and is unable to provide appropriate assist to patient Prior Function Level of Independence: Independent Comments: was indep. until this past sunday Communication Communication: No difficulties Dominant Hand: Right         Vision/Perception Vision - History Baseline Vision: No visual deficits Patient Visual Report: No change from baseline   Cognition  Cognition Arousal/Alertness: Awake/alert Behavior During Therapy: WFL for tasks assessed/performed Overall Cognitive Status: Within Functional Limits for tasks assessed    Extremity/Trunk Assessment Upper Extremity Assessment Upper Extremity Assessment: RUE deficits/detail RUE Deficits / Details: Grossly 3/5 except for shoulder flexion, abduction and supination where he is 1/5 RUE Coordination: decreased gross motor LUE Deficits / Details: WFL Lower Extremity Assessment Lower Extremity Assessment: RLE deficits/detail;LLE deficits/detail RLE Deficits / Details: grossly 3-/5 LLE Deficits / Details: grossly 3-/5 due to pain Cervical / Trunk  Assessment Cervical / Trunk Assessment: Normal     Mobility Bed Mobility Bed Mobility: Not assessed (  pt up in chair upon arrival) Transfers Sit to Stand: 1: +2 Total assist;With upper extremity assist;From chair/3-in-1;With armrests Sit to Stand: Patient Percentage: 40% Stand to Sit: 1: +2 Total assist;With upper extremity assist;To chair/3-in-1;With armrests Stand to Sit: Patient Percentage: 30% Details for Transfer Assistance: pt unable to achieve full upright posture. pt tolerated 1 min of standing prior to reporting "my ankle is going to give out."        Balance Static Standing Balance Static Standing - Level of Assistance: 1: +2 Total assist (60 sec, max assist to achieve bilat hip ext)   End of Session OT - End of Session Equipment Utilized During Treatment: Gait belt Activity Tolerance: Patient limited by fatigue;Patient limited by pain Patient left: in chair Nurse Communication: Mobility status;Need for lift equipment (Use bariatric Stedy)  GO Functional Assessment Tool Used: Clinical observation Functional Limitation: Self care Self Care Current Status (I6962): At least 80 percent but less than 100 percent impaired, limited or restricted Self Care Goal Status (X5284): At least 60 percent but less than 80 percent impaired, limited or restricted   Evette Georges [phone] 10/25/2012, 11:08 AM

## 2012-10-26 LAB — GLUCOSE, CAPILLARY: Glucose-Capillary: 268 mg/dL — ABNORMAL HIGH (ref 70–99)

## 2012-10-26 MED ORDER — DOCUSATE SODIUM 100 MG PO CAPS
100.0000 mg | ORAL_CAPSULE | Freq: Two times a day (BID) | ORAL | Status: DC
  Administered 2012-10-26 – 2012-10-27 (×3): 100 mg via ORAL
  Filled 2012-10-26 (×4): qty 1

## 2012-10-26 NOTE — Progress Notes (Signed)
CSW spoke with Luke [name] at [facility].   Pt does need to meet a deductable for placement of $500. To date the Pt still would not need to pay the deductable balance of $468 for placement today. Pt could make payments while in placement.   CSW informed MD, Nurse, CM, and Pt about d/c plan to [facility] today.   CSW to follow for d/c planning to facility today.   Leron Croak, LCSWA [facility] Emergency Dept.  [phone]

## 2012-10-26 NOTE — Progress Notes (Signed)
CSW contacted by nurse for an update on Pt d/c.   CSW previously this morning contacted [facility] to discuss whether the Pt would need to pay a copay for admission. CSW left a message for admission coordinator to return a call concerning insurance co-pay.   CSW is awaiting a call back from [place].   CSW also contacted [facility] concerning placement. Marylene Land at [facility] will also check with insurance and contact CSW back concerning possible placement.   Leron Croak, LCSWA [facility] Emergency Dept.  [phone]

## 2012-10-26 NOTE — ED Notes (Signed)
Pt stated called family nobody was answering the phone, Later pt sister called stated she was on her way work. She said' she was told early that pt was going to [facility] center therefore she did not make any arrangement to bring him home. " she will no be able to pick him up tonight. But did not stated she she will be able. Issue reported to charge nurse Chrisandra Netters RN

## 2012-10-26 NOTE — ED Notes (Signed)
Social worker in to see pt. Pt updated on progress. Social worker to check on other beds.

## 2012-10-26 NOTE — ED Notes (Signed)
Patient encouraged to increase his mobility. He does not sit up to reach his own urinal. Child psychotherapist called to check on plans for placement

## 2012-10-26 NOTE — ED Notes (Signed)
SPOKE WITH RAQUEL AT [facility]. SHE ADVISES SHE HAS CALLED BC/BS AGAIN AND HOPES TO HAVE AN AUTHORIZATION NUMBER WITHIN THE HOUR

## 2012-10-26 NOTE — ED Provider Notes (Addendum)
Awaiting placement. SW and care management following. Will monitor sugars.   Luke [name]. Rubin Payor, MD 10/26/12 [phone]  Patient has been accepted at nursing home  Luke R. Rubin Payor, MD 10/26/12 1137

## 2012-10-26 NOTE — Progress Notes (Signed)
CSW completed packet and awaiting d/c instructions/summary for discharge to [facility].   CSW to follow for d/c planning.    Leron Croak, LCSWA [facility] Emergency Dept.  [phone]

## 2012-10-26 NOTE — Progress Notes (Signed)
Spoke with [facility] and they continue to work with [place] [employer] [place] on pt's pre-auth.

## 2012-10-26 NOTE — Progress Notes (Signed)
[facility] and CSW both contacted [employer] multiple times tpday re: pt's authorization number.  CSW was told by [facility] that pt would have number today, but that did not happen.  Pt has a bed a [facility] once they have the auth.  Will ask CSW Cassandra to f/u with [employer] and [facility] in the am.  Pt informed.

## 2012-10-26 NOTE — ED Notes (Signed)
PATIENT 2 PERSON ASSIST WITH WALKER TO STAND AND PIVOT ONTO BEDSIDE COMMODE TO HAVE A BOWEL MOVEMENT

## 2012-10-26 NOTE — ED Notes (Signed)
SOCIAL WORKER IN TO TELL PT THAT SHE HAS FOUND PLACEMENT FOR HIM AND HIS FINANCIAL RESPONSIBILITY

## 2012-10-27 LAB — GLUCOSE, CAPILLARY: Glucose-Capillary: 126 mg/dL — ABNORMAL HIGH (ref 70–99)

## 2012-10-27 NOTE — ED Notes (Signed)
PTAR at bedside placing pt in stretcher for transport

## 2012-10-27 NOTE — Progress Notes (Signed)
CSW spoke with Crystal at [facility] concerning placement dispostition.   Pt is cleared to transfer to the facility. CSW will notify RN and CM for d/c planning to [facility] today.   Pt to be d/c today to [facility].   Pt and family agreeable. Confirmed plans with facility.  Plan transfer via EMS.   Leron Croak, LCSWA [facility] Emergency Dept.  [phone]

## 2012-10-27 NOTE — ED Notes (Signed)
Pt notified that he has received placement at nursing home and would be moved once nursing home calls with room number available. Pt states he will call and notify his sister.

## 2012-10-27 NOTE — ED Notes (Addendum)
Pt taken to [facility] via [facility]; report called to [employer] at [facility]. No further questions upon report given. Notified that pt was on his way via [facility]. NAD noted upon d/c. Pt alert and mentating appropriately. Pt verifies understanding of d/c teaching and has no further questions. Pt verifies transfer to nursing home. Pt leaving in stretcher with PTAR.

## 2012-10-27 NOTE — ED Notes (Signed)
Pt requested to be moved from bed to wheelchair at bedside for comfort. Nurse and tech, Herbert Seta helped to move pt from bed into wheelchair. Pt states he is more comfortable in the chair. Pt given call light and phone per request. NAD noted at this time. Pt was able to bear weight on legs with use of walker and two staff assist into the chair.

## 2012-10-27 NOTE — ED Notes (Signed)
Physical therapy at bedside to work with pt

## 2012-10-27 NOTE — Progress Notes (Signed)
Physical Therapy Treatment Patient Details Name: Luke [name] MRN: [id number] DOB: October 10, 1960 Today's Date: 10/27/2012 Time: 3244-0102 PT Time Calculation (min): 24 min  PT Assessment / Plan / Recommendation  PT Comments   Pt making slow progress.  Follow Up Recommendations  SNF     Does the patient have the potential to tolerate intense rehabilitation     Barriers to Discharge        Equipment Recommendations  None recommended by PT    Recommendations for Other Services    Frequency Min 2X/week   Progress towards PT Goals Progress towards PT goals: Progressing toward goals  Plan Current plan remains appropriate;Frequency needs to be updated    Precautions / Restrictions Precautions Precautions: Fall   Pertinent Vitals/Pain Pain in rt ankle and lt hip.  Repositioned.    Mobility  Transfers Sit to Stand: 1: +2 Total assist;With upper extremity assist;From chair/3-in-1;With armrests Sit to Stand: Patient Percentage: 50% Stand to Sit: 1: +2 Total assist;With upper extremity assist;To chair/3-in-1;With armrests Stand to Sit: Patient Percentage: 70% Details for Transfer Assistance: Performed x 3 with verbal cues to bring head forward over knees and for hand placement. Ambulation/Gait Ambulation/Gait Assistance: 1: +2 Total assist (followed with w/c) Ambulation/Gait: Patient Percentage: 70% Ambulation Distance (Feet): 12 Feet (12' x 1, 7'x1) Assistive device: Rolling walker Ambulation/Gait Assistance Details: verbal cues to extend knees, hips, trunk to achieve more upright posture. Gait Pattern: Step-to pattern;Decreased step length - left;Decreased stance time - right;Trunk flexed;Shuffle;Right flexed knee in stance;Left flexed knee in stance    Exercises     PT Diagnosis:    PT Problem List:   PT Treatment Interventions:     PT Goals (current goals can now be found in the care plan section)    Visit Information  Last PT Received On: 10/27/12 Assistance Needed:  +2 History of Present Illness: Pt admitted with R ankle pain and L hip/LE pain    Subjective Data      Cognition  Cognition Arousal/Alertness: Awake/alert Behavior During Therapy: WFL for tasks assessed/performed Overall Cognitive Status: Within Functional Limits for tasks assessed    Balance  Static Standing Balance Static Standing - Balance Support: Bilateral upper extremity supported Static Standing - Level of Assistance: 1: +2 Total assist  End of Session PT - End of Session Equipment Utilized During Treatment: Gait belt Activity Tolerance: Patient limited by pain;Patient limited by fatigue Patient left: in chair;with call bell/phone within reach Nurse Communication: Mobility status   GP     [facility] 10/27/2012, 10:59 AM  [facility] PT [phone]

## 2012-10-30 ENCOUNTER — Telehealth: Payer: Self-pay | Admitting: Gastroenterology

## 2012-10-30 ENCOUNTER — Ambulatory Visit: Payer: BC Managed Care – PPO | Admitting: Gastroenterology

## 2012-10-30 NOTE — Telephone Encounter (Signed)
Pt was a no show

## 2013-01-16 ENCOUNTER — Inpatient Hospital Stay (HOSPITAL_COMMUNITY)
Admission: EM | Admit: 2013-01-16 | Discharge: 2013-01-25 | DRG: 242 | Disposition: A | Discharge status: Skilled Nursing Facility | Payer: BC Managed Care – PPO | Attending: Internal Medicine | Admitting: Internal Medicine

## 2013-01-16 ENCOUNTER — Encounter (HOSPITAL_COMMUNITY): Payer: Self-pay

## 2013-01-16 ENCOUNTER — Ambulatory Visit (HOSPITAL_COMMUNITY)
Admission: RE | Admit: 2013-01-16 | Discharge: 2013-01-16 | Disposition: A | Discharge status: Home / Self Care | Payer: BC Managed Care – PPO | Source: Ambulatory Visit | Attending: Hematology and Oncology | Admitting: Hematology and Oncology

## 2013-01-16 ENCOUNTER — Other Ambulatory Visit: Payer: Self-pay

## 2013-01-16 ENCOUNTER — Encounter (HOSPITAL_COMMUNITY): Payer: BC Managed Care – PPO | Attending: Hematology and Oncology

## 2013-01-16 ENCOUNTER — Emergency Department (HOSPITAL_COMMUNITY): Payer: BC Managed Care – PPO

## 2013-01-16 VITALS — BP 171/85 | HR 139 | Temp 98.5°F | Resp 18

## 2013-01-16 DIAGNOSIS — M869 Osteomyelitis, unspecified: Secondary | ICD-10-CM | POA: Diagnosis present

## 2013-01-16 DIAGNOSIS — M4802 Spinal stenosis, cervical region: Secondary | ICD-10-CM

## 2013-01-16 DIAGNOSIS — M453 Ankylosing spondylitis of cervicothoracic region: Secondary | ICD-10-CM

## 2013-01-16 DIAGNOSIS — R609 Edema, unspecified: Secondary | ICD-10-CM

## 2013-01-16 DIAGNOSIS — E669 Obesity, unspecified: Secondary | ICD-10-CM | POA: Diagnosis present

## 2013-01-16 DIAGNOSIS — R634 Abnormal weight loss: Secondary | ICD-10-CM

## 2013-01-16 DIAGNOSIS — E43 Unspecified severe protein-calorie malnutrition: Secondary | ICD-10-CM

## 2013-01-16 DIAGNOSIS — IMO0002 Reserved for concepts with insufficient information to code with codable children: Secondary | ICD-10-CM

## 2013-01-16 DIAGNOSIS — M459 Ankylosing spondylitis of unspecified sites in spine: Secondary | ICD-10-CM

## 2013-01-16 DIAGNOSIS — L03116 Cellulitis of left lower limb: Secondary | ICD-10-CM

## 2013-01-16 DIAGNOSIS — E1169 Type 2 diabetes mellitus with other specified complication: Secondary | ICD-10-CM

## 2013-01-16 DIAGNOSIS — R000 Tachycardia, unspecified: Secondary | ICD-10-CM

## 2013-01-16 DIAGNOSIS — Z6836 Body mass index (BMI) 36.0-36.9, adult: Secondary | ICD-10-CM

## 2013-01-16 DIAGNOSIS — E119 Type 2 diabetes mellitus without complications: Secondary | ICD-10-CM

## 2013-01-16 DIAGNOSIS — M009 Pyogenic arthritis, unspecified: Principal | ICD-10-CM | POA: Diagnosis present

## 2013-01-16 DIAGNOSIS — F17200 Nicotine dependence, unspecified, uncomplicated: Secondary | ICD-10-CM | POA: Diagnosis present

## 2013-01-16 DIAGNOSIS — R600 Localized edema: Secondary | ICD-10-CM

## 2013-01-16 DIAGNOSIS — M542 Cervicalgia: Secondary | ICD-10-CM

## 2013-01-16 DIAGNOSIS — K8020 Calculus of gallbladder without cholecystitis without obstruction: Secondary | ICD-10-CM

## 2013-01-16 DIAGNOSIS — M436 Torticollis: Secondary | ICD-10-CM

## 2013-01-16 DIAGNOSIS — D892 Hypergammaglobulinemia, unspecified: Secondary | ICD-10-CM

## 2013-01-16 DIAGNOSIS — M25552 Pain in left hip: Secondary | ICD-10-CM

## 2013-01-16 DIAGNOSIS — E1110 Type 2 diabetes mellitus with ketoacidosis without coma: Secondary | ICD-10-CM

## 2013-01-16 DIAGNOSIS — L03115 Cellulitis of right lower limb: Secondary | ICD-10-CM

## 2013-01-16 DIAGNOSIS — M60009 Infective myositis, unspecified site: Secondary | ICD-10-CM

## 2013-01-16 DIAGNOSIS — M86352 Chronic multifocal osteomyelitis, left femur: Secondary | ICD-10-CM

## 2013-01-16 DIAGNOSIS — K4090 Unilateral inguinal hernia, without obstruction or gangrene, not specified as recurrent: Secondary | ICD-10-CM

## 2013-01-16 DIAGNOSIS — R29898 Other symptoms and signs involving the musculoskeletal system: Secondary | ICD-10-CM | POA: Insufficient documentation

## 2013-01-16 DIAGNOSIS — M109 Gout, unspecified: Secondary | ICD-10-CM

## 2013-01-16 DIAGNOSIS — E871 Hypo-osmolality and hyponatremia: Secondary | ICD-10-CM

## 2013-01-16 DIAGNOSIS — M549 Dorsalgia, unspecified: Secondary | ICD-10-CM | POA: Insufficient documentation

## 2013-01-16 DIAGNOSIS — Z113 Encounter for screening for infections with a predominantly sexual mode of transmission: Secondary | ICD-10-CM

## 2013-01-16 DIAGNOSIS — M1990 Unspecified osteoarthritis, unspecified site: Secondary | ICD-10-CM

## 2013-01-16 LAB — DIFFERENTIAL
Basophils Absolute: 0 10*3/uL (ref 0.0–0.1)
Lymphocytes Relative: 18 % (ref 12–46)
Neutro Abs: 8.6 10*3/uL — ABNORMAL HIGH (ref 1.7–7.7)

## 2013-01-16 LAB — CBC WITH DIFFERENTIAL/PLATELET
Basophils Absolute: 0 10*3/uL (ref 0.0–0.1)
Eosinophils Relative: 0 % (ref 0–5)
HCT: 36.3 % — ABNORMAL LOW (ref 39.0–52.0)
Hemoglobin: 11.6 g/dL — ABNORMAL LOW (ref 13.0–17.0)
Lymphocytes Relative: 14 % (ref 12–46)
MCV: 83.8 fL (ref 78.0–100.0)
Monocytes Absolute: 0.9 10*3/uL (ref 0.1–1.0)
Monocytes Relative: 6 % (ref 3–12)
RDW: 16.7 % — ABNORMAL HIGH (ref 11.5–15.5)
WBC: 14.3 10*3/uL — ABNORMAL HIGH (ref 4.0–10.5)

## 2013-01-16 LAB — COMPREHENSIVE METABOLIC PANEL
AST: 8 U/L (ref 0–37)
Albumin: 2.9 g/dL — ABNORMAL LOW (ref 3.5–5.2)
Alkaline Phosphatase: 75 U/L (ref 39–117)
Chloride: 100 mEq/L (ref 96–112)
Creatinine, Ser: 0.59 mg/dL (ref 0.50–1.35)
Potassium: 3.6 mEq/L (ref 3.5–5.1)
Total Bilirubin: 0.2 mg/dL — ABNORMAL LOW (ref 0.3–1.2)

## 2013-01-16 LAB — SEDIMENTATION RATE: Sed Rate: 79 mm/hr — ABNORMAL HIGH (ref 0–16)

## 2013-01-16 LAB — GLUCOSE, CAPILLARY: Glucose-Capillary: 89 mg/dL (ref 70–99)

## 2013-01-16 LAB — CBC
Platelets: 380 10*3/uL (ref 150–400)
RDW: 16.6 % — ABNORMAL HIGH (ref 11.5–15.5)
WBC: 11.7 10*3/uL — ABNORMAL HIGH (ref 4.0–10.5)

## 2013-01-16 LAB — BASIC METABOLIC PANEL
Chloride: 99 mEq/L (ref 96–112)
GFR calc Af Amer: >90 mL/min (ref >90)
GFR calc non Af Amer: >90 mL/min (ref >90)
Potassium: 3.5 mEq/L (ref 3.5–5.1)
Sodium: 136 mEq/L (ref 135–145)

## 2013-01-16 MED ORDER — FENTANYL CITRATE 0.05 MG/ML IJ SOLN
50.0000 ug | Freq: Once | INTRAMUSCULAR | Status: AC
  Administered 2013-01-16: 50 ug via INTRAVENOUS
  Filled 2013-01-16: qty 2

## 2013-01-16 MED ORDER — FENTANYL CITRATE 0.05 MG/ML IJ SOLN
INTRAMUSCULAR | Status: AC
  Filled 2013-01-16: qty 2

## 2013-01-16 MED ORDER — FENTANYL CITRATE 0.05 MG/ML IJ SOLN
50.0000 ug | Freq: Once | INTRAMUSCULAR | Status: AC
  Administered 2013-01-16: 50 ug via INTRAVENOUS

## 2013-01-16 NOTE — Progress Notes (Signed)
[facility] NEW PATIENT EVALUATION   Name: Luke [name] Date: 01/16/2013 MRN: [id number] DOB: 1961-01-20  PCP: Donnetta Hail, MD  REFERRING PHYSICIAN: Marletta Lor, NP   REASON FOR REFERRAL: Abnormal M-spike with back pain, weight loss, and lower extremity weakness since June 2014.     HISTORY OF PRESENT ILLNESS:Luke [name] is a 52 y.o. male  referred with lower extremity weakness and left hip pain worsening since June 2014 when he was hospitalized with new onset diabetes treated at first with insulin and oral agents and most recently with oral agents alone. Using opiod analgesics.  Fasting blood sugar this AM was 107mg %. Appetite good without nausea, vomiting, headache, cough wheezing but with bilateral lower extremity edema.  positive for fatigue and weight loss .   PAST MEDICAL HISTORY:  has a past medical history of DM (diabetes mellitus); Unintentional weight loss (10/16/2012); DM (diabetes mellitus) type 2, uncontrolled, with ketoacidosis (10/16/2012); Gout; Protein-calorie malnutrition, severe (10/20/2012); Cervical stenosis of spine (10/20/2012); Right inguinal hernia (10/20/2012); Cholelithiasis (10/20/2012); Ankylosing spondylitis of cervicothoracic region (10/21/2012); Chronic inflammatory arthritis (10/21/2012); and Pain, joint, multiple sites.     PAST SURGICAL HISTORY: Past Surgical History  Procedure Laterality Date  . No past surgeries       CURRENT MEDICATIONS: has a current medication list which includes the following prescription(s): diclofenac sodium, diphenhydramine, gabapentin, hydrocodone-acetaminophen, lisinopril, metformin, methocarbamol, prednisone, and multivitamin with minerals.   ALLERGIES: Bee venom and Shellfish allergy   SOCIAL HISTORY:  reports that he has been smoking.  He does not have any smokeless tobacco history on file. He reports that he does not drink alcohol or use illicit drugs.   FAMILY HISTORY: family history includes Arthritis  in an other family member; Diabetes in his brother and brother.   LABORATORY DATA:  Lab Results  Component Value Date   WBC 11.7* 01/16/2013   HGB 11.4* 01/16/2013   HCT 36.3* 01/16/2013   MCV 84.2 01/16/2013   PLT 380 01/16/2013   Recent Results (from the past 2160 hour(s))  GLUCOSE, CAPILLARY     Status: Abnormal   Collection Time    10/18/12  4:34 PM      Result Value Range   Glucose-Capillary 334 (*) 70 - 99 mg/dL  GLUCOSE, CAPILLARY     Status: Abnormal   Collection Time    10/18/12  9:08 PM      Result Value Range   Glucose-Capillary 354 (*) 70 - 99 mg/dL  CBC     Status: Abnormal   Collection Time    10/19/12  5:42 AM      Result Value Range   WBC 23.1 (*) 4.0 - 10.5 K/uL   RBC 4.83  4.22 - 5.81 MIL/uL   Hemoglobin 13.8  13.0 - 17.0 g/dL   HCT 82.9  56.2 - 13.0 %   MCV 82.2  78.0 - 100.0 fL   MCH 28.6  26.0 - 34.0 pg   MCHC 34.8  30.0 - 36.0 g/dL   RDW 86.5  78.4 - 69.6 %   Platelets 201  150 - 400 K/uL  BASIC METABOLIC PANEL     Status: Abnormal   Collection Time    10/19/12  5:42 AM      Result Value Range   Sodium 134 (*) 135 - 145 mEq/L   Potassium 3.7  3.5 - 5.1 mEq/L   Chloride 95 (*) 96 - 112 mEq/L   CO2 27  19 - 32 mEq/L   Glucose,  Bld 303 (*) 70 - 99 mg/dL   BUN 10  6 - 23 mg/dL   Creatinine, Ser 1.61 (*) 0.50 - 1.35 mg/dL   Calcium 9.0  8.4 - 09.6 mg/dL   GFR calc non Af Amer >90  >90 mL/min   GFR calc Af Amer >90  >90 mL/min   Comment:            The eGFR has been calculated     using the CKD EPI equation.     This calculation has not been     validated in all clinical     situations.     eGFR's persistently     <90 mL/min signify     possible Chronic Kidney Disease.  CK     Status: None   Collection Time    10/19/12  5:42 AM      Result Value Range   Total CK 13  7 - 232 U/L  GLUCOSE, CAPILLARY     Status: Abnormal   Collection Time    10/19/12  7:08 AM      Result Value Range   Glucose-Capillary 339 (*) 70 - 99 mg/dL   Comment 1  Notify RN    GLUCOSE, CAPILLARY     Status: Abnormal   Collection Time    10/19/12 11:27 AM      Result Value Range   Glucose-Capillary 272 (*) 70 - 99 mg/dL   Comment 1 Notify RN    GLUCOSE, CAPILLARY     Status: Abnormal   Collection Time    10/19/12  8:51 PM      Result Value Range   Glucose-Capillary 182 (*) 70 - 99 mg/dL   Comment 1 Notify RN     Comment 2 Documented in Chart    VANCOMYCIN, TROUGH     Status: Abnormal   Collection Time    10/20/12  5:21 AM      Result Value Range   Vancomycin Tr 9.5 (*) 10.0 - 20.0 ug/mL  CBC     Status: Abnormal   Collection Time    10/20/12  5:22 AM      Result Value Range   WBC 19.7 (*) 4.0 - 10.5 K/uL   RBC 4.72  4.22 - 5.81 MIL/uL   Hemoglobin 13.7  13.0 - 17.0 g/dL   HCT 04.5  40.9 - 81.1 %   MCV 83.1  78.0 - 100.0 fL   MCH 29.0  26.0 - 34.0 pg   MCHC 34.9  30.0 - 36.0 g/dL   RDW 91.4  78.2 - 95.6 %   Platelets 235  150 - 400 K/uL  LIPASE, BLOOD     Status: None   Collection Time    10/20/12  5:22 AM      Result Value Range   Lipase 33  11 - 59 U/L  HEPATIC FUNCTION PANEL     Status: Abnormal   Collection Time    10/20/12  5:22 AM      Result Value Range   Total Protein 7.2  6.0 - 8.3 g/dL   Albumin 1.8 (*) 3.5 - 5.2 g/dL   AST 15  0 - 37 U/L   ALT 14  0 - 53 U/L   Alkaline Phosphatase 246 (*) 39 - 117 U/L   Total Bilirubin 0.3  0.3 - 1.2 mg/dL   Bilirubin, Direct 0.1  0.0 - 0.3 mg/dL   Indirect Bilirubin 0.2 (*) 0.3 - 0.9 mg/dL  GLUCOSE, CAPILLARY  Status: Abnormal   Collection Time    10/20/12  7:20 AM      Result Value Range   Glucose-Capillary 136 (*) 70 - 99 mg/dL   Comment 1 Notify RN    GLUCOSE, CAPILLARY     Status: Abnormal   Collection Time    10/20/12 11:31 AM      Result Value Range   Glucose-Capillary 332 (*) 70 - 99 mg/dL   Comment 1 Notify RN    GLUCOSE, CAPILLARY     Status: Abnormal   Collection Time    10/20/12  5:22 PM      Result Value Range   Glucose-Capillary 337 (*) 70 - 99 mg/dL    Comment 1 Notify RN     Comment 2 Documented in Chart    GLUCOSE, CAPILLARY     Status: Abnormal   Collection Time    10/20/12  9:11 PM      Result Value Range   Glucose-Capillary 260 (*) 70 - 99 mg/dL   Comment 1 Notify RN     Comment 2 Documented in Chart    CBC WITH DIFFERENTIAL     Status: Abnormal   Collection Time    10/21/12  6:25 AM      Result Value Range   WBC 20.1 (*) 4.0 - 10.5 K/uL   RBC 4.77  4.22 - 5.81 MIL/uL   Hemoglobin 13.9  13.0 - 17.0 g/dL   HCT 78.2  95.6 - 21.3 %   MCV 84.3  78.0 - 100.0 fL   MCH 29.1  26.0 - 34.0 pg   MCHC 34.6  30.0 - 36.0 g/dL   RDW 08.6  57.8 - 46.9 %   Platelets 265  150 - 400 K/uL   Neutrophils Relative % 82 (*) 43 - 77 %   Neutro Abs 16.4 (*) 1.7 - 7.7 K/uL   Lymphocytes Relative 10 (*) 12 - 46 %   Lymphs Abs 2.0  0.7 - 4.0 K/uL   Monocytes Relative 8  3 - 12 %   Monocytes Absolute 1.7 (*) 0.1 - 1.0 K/uL   Eosinophils Relative 0  0 - 5 %   Eosinophils Absolute 0.0  0.0 - 0.7 K/uL   Basophils Relative 0  0 - 1 %   Basophils Absolute 0.1  0.0 - 0.1 K/uL  GLUCOSE, CAPILLARY     Status: Abnormal   Collection Time    10/21/12  7:21 AM      Result Value Range   Glucose-Capillary 149 (*) 70 - 99 mg/dL  GLUCOSE, CAPILLARY     Status: Abnormal   Collection Time    10/21/12 11:52 AM      Result Value Range   Glucose-Capillary 275 (*) 70 - 99 mg/dL  CBC WITH DIFFERENTIAL     Status: Abnormal   Collection Time    10/24/12 12:52 PM      Result Value Range   WBC 24.2 (*) 4.0 - 10.5 K/uL   RBC 4.85  4.22 - 5.81 MIL/uL   Hemoglobin 14.3  13.0 - 17.0 g/dL   HCT 62.9  52.8 - 41.3 %   MCV 84.5  78.0 - 100.0 fL   MCH 29.5  26.0 - 34.0 pg   MCHC 34.9  30.0 - 36.0 g/dL   RDW 24.4  01.0 - 27.2 %   Platelets 370  150 - 400 K/uL   Neutrophils Relative % 91 (*) 43 - 77 %   Neutro Abs 22.0 (*)  1.7 - 7.7 K/uL   Lymphocytes Relative 5 (*) 12 - 46 %   Lymphs Abs 1.2  0.7 - 4.0 K/uL   Monocytes Relative 4  3 - 12 %   Monocytes Absolute 1.0   0.1 - 1.0 K/uL   Eosinophils Relative 0  0 - 5 %   Eosinophils Absolute 0.0  0.0 - 0.7 K/uL   Basophils Relative 0  0 - 1 %   Basophils Absolute 0.0  0.0 - 0.1 K/uL  BASIC METABOLIC PANEL     Status: Abnormal   Collection Time    10/24/12 12:52 PM      Result Value Range   Sodium 134 (*) 135 - 145 mEq/L   Potassium 4.0  3.5 - 5.1 mEq/L   Chloride 93 (*) 96 - 112 mEq/L   CO2 27  19 - 32 mEq/L   Glucose, Bld 365 (*) 70 - 99 mg/dL   BUN 12  6 - 23 mg/dL   Creatinine, Ser 1.61  0.50 - 1.35 mg/dL   Calcium 9.3  8.4 - 09.6 mg/dL   GFR calc non Af Amer >90  >90 mL/min   GFR calc Af Amer >90  >90 mL/min   Comment:            The eGFR has been calculated     using the CKD EPI equation.     This calculation has not been     validated in all clinical     situations.     eGFR's persistently     <90 mL/min signify     possible Chronic Kidney Disease.  GLUCOSE, CAPILLARY     Status: Abnormal   Collection Time    10/24/12 12:58 PM      Result Value Range   Glucose-Capillary 345 (*) 70 - 99 mg/dL  URINALYSIS, ROUTINE W REFLEX MICROSCOPIC     Status: Abnormal   Collection Time    10/24/12  1:02 PM      Result Value Range   Color, Urine YELLOW  YELLOW   APPearance CLOUDY (*) CLEAR   Specific Gravity, Urine 1.015  1.005 - 1.030   pH 6.0  5.0 - 8.0   Glucose, UA 500 (*) NEGATIVE mg/dL   Hgb urine dipstick NEGATIVE  NEGATIVE   Bilirubin Urine NEGATIVE  NEGATIVE   Ketones, ur 40 (*) NEGATIVE mg/dL   Protein, ur NEGATIVE  NEGATIVE mg/dL   Urobilinogen, UA 2.0 (*) 0.0 - 1.0 mg/dL   Nitrite NEGATIVE  NEGATIVE   Leukocytes, UA SMALL (*) NEGATIVE  URINE MICROSCOPIC-ADD ON     Status: Abnormal   Collection Time    10/24/12  1:02 PM      Result Value Range   Squamous Epithelial / LPF FEW (*) RARE   WBC, UA 11-20  <3 WBC/hpf   RBC / HPF 0-2  <3 RBC/hpf   Bacteria, UA RARE  RARE  GLUCOSE, CAPILLARY     Status: Abnormal   Collection Time    10/24/12  6:19 PM      Result Value Range    Glucose-Capillary 302 (*) 70 - 99 mg/dL  GLUCOSE, CAPILLARY     Status: Abnormal   Collection Time    10/24/12 10:09 PM      Result Value Range   Glucose-Capillary 335 (*) 70 - 99 mg/dL  GLUCOSE, CAPILLARY     Status: Abnormal   Collection Time    10/25/12  7:24 AM  Result Value Range   Glucose-Capillary 235 (*) 70 - 99 mg/dL  GLUCOSE, CAPILLARY     Status: Abnormal   Collection Time    10/25/12 11:28 AM      Result Value Range   Glucose-Capillary 257 (*) 70 - 99 mg/dL  GLUCOSE, CAPILLARY     Status: Abnormal   Collection Time    10/25/12  3:34 PM      Result Value Range   Glucose-Capillary 232 (*) 70 - 99 mg/dL  GLUCOSE, CAPILLARY     Status: Abnormal   Collection Time    10/25/12  5:12 PM      Result Value Range   Glucose-Capillary 248 (*) 70 - 99 mg/dL   Comment 1 Documented in Chart     Comment 2 Notify RN    GLUCOSE, CAPILLARY     Status: Abnormal   Collection Time    10/25/12 10:25 PM      Result Value Range   Glucose-Capillary 303 (*) 70 - 99 mg/dL  GLUCOSE, CAPILLARY     Status: Abnormal   Collection Time    10/26/12  7:06 AM      Result Value Range   Glucose-Capillary 224 (*) 70 - 99 mg/dL  GLUCOSE, CAPILLARY     Status: Abnormal   Collection Time    10/26/12 11:20 AM      Result Value Range   Glucose-Capillary 212 (*) 70 - 99 mg/dL  GLUCOSE, CAPILLARY     Status: Abnormal   Collection Time    10/26/12  5:15 PM      Result Value Range   Glucose-Capillary 192 (*) 70 - 99 mg/dL  GLUCOSE, CAPILLARY     Status: Abnormal   Collection Time    10/26/12  9:30 PM      Result Value Range   Glucose-Capillary 268 (*) 70 - 99 mg/dL  GLUCOSE, CAPILLARY     Status: Abnormal   Collection Time    10/27/12  6:22 AM      Result Value Range   Glucose-Capillary 126 (*) 70 - 99 mg/dL   Comment 1 Notify RN     Comment 2 Documented in Chart    CBC     Status: Abnormal   Collection Time    01/16/13  3:08 PM      Result Value Range   WBC 11.7 (*) 4.0 - 10.5 K/uL    RBC 4.31  4.22 - 5.81 MIL/uL   Hemoglobin 11.4 (*) 13.0 - 17.0 g/dL   HCT 96.0 (*) 45.4 - 09.8 %   MCV 84.2  78.0 - 100.0 fL   MCH 26.5  26.0 - 34.0 pg   MCHC 31.4  30.0 - 36.0 g/dL   RDW 11.9 (*) 14.7 - 82.9 %   Platelets 380  150 - 400 K/uL  DIFFERENTIAL     Status: Abnormal   Collection Time    01/16/13  3:08 PM      Result Value Range   Neutrophils Relative % 74  43 - 77 %   Neutro Abs 8.6 (*) 1.7 - 7.7 K/uL   Lymphocytes Relative 18  12 - 46 %   Lymphs Abs 2.1  0.7 - 4.0 K/uL   Monocytes Relative 7  3 - 12 %   Monocytes Absolute 0.8  0.1 - 1.0 K/uL   Eosinophils Relative 1  0 - 5 %   Eosinophils Absolute 0.1  0.0 - 0.7 K/uL   Basophils Relative 0  0 -  1 %   Basophils Absolute 0.0  0.0 - 0.1 K/uL     RADIOGRAPHY: RADIOLOGY REPORT*  Clinical Data: Thoracic spine pain  THORACIC SPINE - 2 VIEW + SWIMMERS  Comparison: CT chest 10/16/2047  Findings:  Mild scattered endplate spur formation.  Diffuse thoracic ankylosis and calcification of the anterior  longitudinal ligament. Vertebral body heights maintained without  fracture or subluxation.  No bone destruction or widening paraspinal lines.  Visualized posterior ribs unremarkable.  IMPRESSION:  Diffuse thoracic spine ankylosis question ankylosing spondylitis.   No results found.   Clinical Data: Diabetes, nausea, left leg weakness.  CT ABDOMEN AND PELVIS WITHOUT CONTRAST  Technique: Multidetector CT imaging of the abdomen and pelvis was  performed following the standard protocol without intravenous  contrast.  Comparison: None.  Findings: Visualized lung bases clear. Unremarkable uninfused  evaluation of liver, spleen, adrenal glands.  Multiple large partially calcified stones in the contracted  gallbladder. Unremarkable pancreas. Exophytic fluid attenuation  lesion from the lower pole left kidney. No nephrolithiasis or  hydronephrosis. Aorta normal in caliber. Stomach, small bowel,  and colon are nondilated.   Right inguinal hernia involving loops of small bowel without  evident obstruction or strangulation. Normal appendix. Urinary  bladder physiologically distended. Moderate prostatic enlargement.  No ascites. No free air. Flowing osteophytes throughout the lower  thoracic and lumbar spine. Ankylosis across the sacroiliac joints.  Osteophytes about bilateral hips.  IMPRESSION:  1. Large right inguinal hernia containing small bowel, without  evidence of obstruction or strangulation.  2. Cholelithiasis.   Clinical Data: Neck pain and stiffness. No injury. Elevated white  count  CT CERVICAL SPINE WITHOUT CONTRAST  Technique: Multidetector CT imaging of the cervical spine was  performed. Multiplanar CT image reconstructions were also  generated.  Comparison: None  Findings: Normal alignment. No fracture. 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  No evidence of diskitis or osteomyelitis. No bony destruction.  Patchy areas of sclerosis are present C3-C6, most prominent C5  vertebral body. This is most likely degenerative in nature however  metastatic disease could have this appearance also. Question  prostate cancer.  C2-3: Disc degeneration and spondylosis  C3-4: Disc degeneration with spondylosis and mild O P L L. Mild  right facet degeneration and right foraminal encroachment  C4-5: Disc degeneration with ossification of the posterior  longitudinal ligament causing mild spinal stenosis and mild  foraminal stenosis bilaterally  C5-6: Disc degeneration with ossification of the posterior  longitudinal ligament causing mild spinal stenosis. Mild foraminal  encroachment bilaterally  C6-7: Disc degeneration and mild spondylosis.  C7-T1: Negative  IMPRESSION:  Cervical degenerative changes with mild spinal stenosis related to  ossification of the posterior longitudinal ligament and  spondylosis. Negative for fracture.   Patchy areas of vertebral body sclerosis, most likely degenerative  however osteoblastic metastatic disease is a possibility.  Original Report Authenticated By: Janeece Riggers, M.D.  REVIEW OF SYSTEMS: Constitutional: positive for anorexia, fatigue and weight loss Musculoskeletal:positive for back pain, muscle weakness and stiff joints Neurological: positive for weakness Endocrine: positive for diabetic symptoms including increased fatigue and weight loss   PHYSICAL EXAM:  oral temperature is 98.5 F (36.9 C). His blood pressure is 171/85 and his pulse is 139. His respiration is 18.  General appearance: severe distress and morbidly obese Head: Normocephalic, without obvious abnormality, atraumatic Neck: no adenopathy, no carotid bruit, no JVD, supple, symmetrical, trachea midline and thyroid not enlarged, symmetric, no tenderness/mass/nodules  Lymph nodes: Cervical, supraclavicular, and axillary nodes normal. Resp: clear to auscultation bilaterally Back: symmetric, no curvature. ROM normal. No CVA tenderness., tenderness over lumbar spine with decreased range of motion left hip Cardio: regular rate and rhythm, S1, S2 normal, no murmur, click, rub or gallop GI: obese without organomegaly Genitalia: penis exam: non focal circumcised Extremities: edema Plus 2 pitting edema both lower extremities Neurologic: Gait: Abnormal    IMPRESSION:  1. Back pain with M-Spike, high ESR of 107 highly suspicious for multiple myeloma 2. Possible compression fracture L-spine 3. New onset diabetes mellitus presenting with DKA, now controlled on oral hypoglycemics 4. Morbid obesity 5. Ankylosing spondylitis C- and L-Spine 6. Cholelithiasis. 7. Right inguinal hernia.   PLAN:  1. Additional labs including IF, light chains, 24hour urine quantitative immunofixation, Beta2 microglobulin, LDH, PSA 2. Skeletal survey. 3. Followup one week.  Will need bone marrow by interventional radiology.   Maurilio Lovely, MD 01/16/2013 3:58 PM

## 2013-01-16 NOTE — Progress Notes (Signed)
Luke [name]'s reason for visit today is for MD visit and labs as scheduled per MD orders.  Venipuncture performed with a 23 gauge butterfly needle to R Antecubital.  Luke [name] tolerated venipuncture well and without incident; questions were answered and patient was discharged.

## 2013-01-16 NOTE — ED Notes (Signed)
Pt reports is being seen in the cancer center to determine if he has bone cancer.  Pt says had a lot of xrays today and they moved his left leg around a lot.  Pt says was trying to get back in his car and could not due to intense pain in left hip and leg.  Denies injury.

## 2013-01-16 NOTE — ED Notes (Signed)
Pt has pitting edema to bilateral lower extremities for over 1 week.  Denies any SOB.

## 2013-01-16 NOTE — Patient Instructions (Signed)
[facility] Discharge Instructions  RECOMMENDATIONS MADE BY THE CONSULTANT AND ANY TEST RESULTS WILL BE SENT TO YOUR REFERRING PHYSICIAN.  EXAM FINDINGS BY THE PHYSICIAN TODAY AND SIGNS OR SYMPTOMS TO REPORT TO CLINIC OR PRIMARY PHYSICIAN: Exam findings as discussed by Dr. Zigmund Daniel.  SPECIAL INSTRUCTIONS/FOLLOW-UP: 1.  Labs and xrays today - we will contact you with any abnormals. 2.  Please return in 1 week as scheduled.  Thank you for choosing [facility] to provide your oncology and hematology care.  To afford each patient quality time with our providers, please arrive at least 15 minutes before your scheduled appointment time.  With your help, our goal is to use those 15 minutes to complete the necessary work-up to ensure our physicians have the information they need to help with your evaluation and healthcare recommendations.    Effective January 1st, 2014, we ask that you re-schedule your appointment with our physicians should you arrive 10 or more minutes late for your appointment.  We strive to give you quality time with our providers, and arriving late affects you and other patients whose appointments are after yours.    Again, thank you for choosing [facility].  Our hope is that these requests will decrease the amount of time that you wait before being seen by our physicians.       _____________________________________________________________  Should you have questions after your visit to [facility], please contact our office at [phone] between the hours of 8:30 a.m. and 5:00 p.m.  Voicemails left after 4:30 p.m. will not be returned until the following business day.  For prescription refill requests, have your pharmacy contact our office with your prescription refill request.

## 2013-01-16 NOTE — ED Provider Notes (Signed)
CSN: [id number]     Arrival date & time 01/16/13  1730 History   First MD Initiated Contact with Patient 01/16/13 2032     Chief Complaint  Patient presents with  . Leg Pain  . Hip Pain  . Leg Swelling   (Consider location/radiation/quality/duration/timing/severity/associated sxs/prior Treatment) Patient is a 52 y.o. male presenting with leg pain and hip pain. The history is provided by the patient (the pt complains of left hip pain for months worse today after many x-rays).  Leg Pain Location:  Hip Pain details:    Quality:  Aching   Radiates to:  Does not radiate   Severity:  Severe   Onset quality:  Gradual   Timing:  Constant Associated symptoms: no back pain and no fatigue   Hip Pain Pertinent negatives include no chest pain, no abdominal pain and no headaches.    Past Medical History  Diagnosis Date  . DM (diabetes mellitus)   . Unintentional weight loss 10/16/2012  . DM (diabetes mellitus) type 2, uncontrolled, with ketoacidosis 10/16/2012    DKA  . Gout     Question diagnosis  . Protein-calorie malnutrition, severe 10/20/2012  . Cervical stenosis of spine 10/20/2012    Mild per CT  . Right inguinal hernia 10/20/2012    Large, without obstruction  . Cholelithiasis 10/20/2012  . 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 Chronic inflammatory arthritis 10/21/2012  . Pain, joint, multiple sites    Past Surgical History  Procedure Laterality Date  . No past surgeries     Family History  Problem Relation Age of Onset  . Arthritis    . Diabetes Brother   . Diabetes Brother    History  Substance Use Topics  . Smoking status: Current Every Day Smoker -- 1.00 packs/day  . Smokeless tobacco: Not on file  . Alcohol Use: No    Review of Systems  Constitutional: Negative for appetite change and fatigue.  HENT: Negative for congestion, sinus pressure and ear discharge.   Eyes: Negative for discharge.  Respiratory: Negative for cough.    Cardiovascular: Negative for chest pain.  Gastrointestinal: Negative for abdominal pain and diarrhea.  Genitourinary: Negative for frequency and hematuria.  Musculoskeletal: Negative for back pain.       Hip pain  Skin: Negative for rash.  Neurological: Negative for seizures and headaches.  Psychiatric/Behavioral: Negative for hallucinations.    Allergies  Bee venom and Shellfish allergy  Home Medications   Current Outpatient Rx  Name  Route  Sig  Dispense  Refill  . acetaminophen (TYLENOL) 500 MG tablet   Oral   Take 500 mg by mouth every 6 (six) hours as needed for pain.         . diphenhydrAMINE (BENADRYL) 25 MG tablet   Oral   Take 25-50 mg by mouth daily as needed. For itching and allergy symptoms         . gabapentin (NEURONTIN) 300 MG capsule   Oral   Take 300 mg by mouth at bedtime as needed (for pain).          Marland Kitchen HYDROcodone-acetaminophen (NORCO/VICODIN) 5-325 MG per tablet   Oral   Take 1 tablet by mouth every 6 (six) hours as needed for pain.          . metFORMIN (GLUCOPHAGE) 500 MG tablet   Oral   Take 500 mg by mouth 2 (two) times daily with a meal.         . Multiple  Vitamin (MULTIVITAMIN WITH MINERALS) TABS   Oral   Take 1 tablet by mouth daily.         . predniSONE (DELTASONE) 5 MG tablet   Oral   Take 5 mg by mouth daily.         . diclofenac sodium (VOLTAREN) 1 % GEL   Topical   Apply 2 g topically 3 (three) times daily as needed (arthritis pain). For arthritis pain.          BP 138/76  Pulse 113  Temp(Src) 98.4 F (36.9 C) (Oral)  Resp 20  Ht 5\' 6"  (1.676 m)  Wt 225 lb (102.059 kg)  BMI 36.33 kg/m2  SpO2 100% Physical Exam  Constitutional: He is oriented to person, place, and time. He appears well-developed.  HENT:  Head: Normocephalic.  Eyes: Conjunctivae and EOM are normal. No scleral icterus.  Neck: Neck supple. No thyromegaly present.  Cardiovascular: Normal rate and regular rhythm.  Exam reveals no gallop and  no friction rub.   No murmur heard. Pulmonary/Chest: No stridor. He has no wheezes. He has no rales. He exhibits no tenderness.  Abdominal: He exhibits no distension. There is no tenderness. There is no rebound.  Musculoskeletal: He exhibits no edema.  Decrease range of motion and severe tender left hip  Lymphadenopathy:    He has no cervical adenopathy.  Neurological: He is oriented to person, place, and time. Coordination normal.  Skin: No rash noted. No erythema.  Psychiatric: He has a normal mood and affect. His behavior is normal.    ED Course  Procedures (including critical care time) Labs Review Labs Reviewed  CBC WITH DIFFERENTIAL - Abnormal; Notable for the following:    WBC 14.3 (*)    Hemoglobin 11.6 (*)    HCT 36.3 (*)    RDW 16.7 (*)    Neutrophils Relative % 80 (*)    Neutro Abs 11.4 (*)    All other components within normal limits  BASIC METABOLIC PANEL - Abnormal; Notable for the following:    Glucose, Bld 129 (*)    All other components within normal limits  GLUCOSE, CAPILLARY   Imaging Review Dg Pelvis 1-2 Views  01/16/2013   CLINICAL DATA:  Severe left hip and thigh pain.  EXAM: PELVIS - 1-2 VIEW  COMPARISON:  No priors.  FINDINGS: There is complete loss of the joint space in the left hip with very ill-defined detail of the femoral head in the associated acetabulum, with surrounding soft tissue swelling, highly concerning for potential septic arthritis of the left hip. These features are new compared to recent CT scan 10/19/2012. No acute displaced fractures are identified.  IMPRESSION: 1. Findings are concerning for potential septic arthritis of the left hip joint. This could be further evaluated with MRI with and without IV gadolinium of the hip if clinically indicated.  These results were called by telephone at the time of interpretation on 01/16/2013 at 10:53 PMto Aaliyah Cancro , who verbally acknowledged these results.   Electronically Signed   By: Trudie Reed M.D.   On: 01/16/2013 22:56   Dg Femur Left  01/16/2013   CLINICAL DATA:  Severe left hip pain.  EXAM: LEFT FEMUR - 2 VIEW  COMPARISON:  Bone survey 01/16/2013.  FINDINGS: Complete loss of joint space in the left hip with poor definition of the bony trabecula in the left femoral head and adjacent acetabular regions, with surrounding ill-defined soft tissue prominence adjacent to the left hip joint, concerning  for potential septic arthritis. The more distal aspects of the left femur are intact, without evidence of acute displaced fracture.  IMPRESSION: 1. Findings concerning for potential septic arthritis in the left hip joint. This could be confirmed with MRI of the left hip with and without IV gadolinium if clinically indicated. These results were called by telephone at the time of interpretation on 01/16/2013 at 10:55 PMto Lanasia Porras , who verbally acknowledged these results.   Electronically Signed   By: Trudie Reed M.D.   On: 01/16/2013 22:57   Dg Bone Survey Met  01/16/2013   CLINICAL DATA:  Para proteinemia. Evaluate for possible lytic bone lesions secondary to myeloma. Severe left hip pain.  EXAM: METASTATIC BONE SURVEY  COMPARISON:  Thoracic spine radiographs, 10/2012  FINDINGS: No lytic lesions are seen to suggest multiple myeloma.  Bridging enthesophytes span the thoracolumbar spine. There is fusion of the SI joints with the combination of findings consistent with ankylosing spondylitis. The thoracic spine appearance is stable from the prior exam.  There are marked degenerative changes in the lower cervical spine.  Advanced arthropathic changes of the left hip are reflected by marked joint space narrowing, which is concentric, with marginal osteophytes around the base of the femoral head. The right hip joint is relatively well maintained.  The bones are diffusely demineralized.  There is a portion of a needle within the medial soft tissues, medial to the right distal femoral  metaphysis, which is presumed to be chronic.  IMPRESSION: No lytic lesions are seen to suggest multiple myeloma.   Electronically Signed   By: Amie Portland   On: 01/16/2013 16:52    MDM  No diagnosis found.     Benny Lennert, MD 01/16/13 2312

## 2013-01-17 ENCOUNTER — Inpatient Hospital Stay (HOSPITAL_COMMUNITY): Payer: BC Managed Care – PPO

## 2013-01-17 ENCOUNTER — Encounter (HOSPITAL_COMMUNITY): Payer: Self-pay | Admitting: Internal Medicine

## 2013-01-17 DIAGNOSIS — IMO0002 Reserved for concepts with insufficient information to code with codable children: Secondary | ICD-10-CM

## 2013-01-17 DIAGNOSIS — M25552 Pain in left hip: Secondary | ICD-10-CM | POA: Diagnosis present

## 2013-01-17 DIAGNOSIS — M25559 Pain in unspecified hip: Secondary | ICD-10-CM

## 2013-01-17 DIAGNOSIS — M009 Pyogenic arthritis, unspecified: Secondary | ICD-10-CM | POA: Diagnosis present

## 2013-01-17 LAB — COMPREHENSIVE METABOLIC PANEL
ALT: <5 U/L (ref 0–53)
Alkaline Phosphatase: 73 U/L (ref 39–117)
CO2: 24 mEq/L (ref 19–32)
Chloride: 102 mEq/L (ref 96–112)
GFR calc Af Amer: >90 mL/min (ref >90)
Glucose, Bld: 121 mg/dL — ABNORMAL HIGH (ref 70–99)
Potassium: 3.4 mEq/L — ABNORMAL LOW (ref 3.5–5.1)
Sodium: 135 mEq/L (ref 135–145)
Total Protein: 7.5 g/dL (ref 6.0–8.3)

## 2013-01-17 LAB — CBC
HCT: 36 % — ABNORMAL LOW (ref 39.0–52.0)
MCHC: 31.4 g/dL (ref 30.0–36.0)
RDW: 16.7 % — ABNORMAL HIGH (ref 11.5–15.5)

## 2013-01-17 LAB — GLUCOSE, CAPILLARY
Glucose-Capillary: 93 mg/dL (ref 70–99)
Glucose-Capillary: 85 mg/dL (ref 70–99)

## 2013-01-17 LAB — HEMOGLOBIN A1C: Mean Plasma Glucose: 143 mg/dL — ABNORMAL HIGH (ref <117)

## 2013-01-17 LAB — CK TOTAL AND CKMB (NOT AT ARMC): Total CK: 52 U/L (ref 7–232)

## 2013-01-17 MED ORDER — LIDOCAINE HCL (PF) 1 % IJ SOLN
INTRAMUSCULAR | Status: AC
  Filled 2013-01-17: qty 5

## 2013-01-17 MED ORDER — HYDROMORPHONE HCL PF 1 MG/ML IJ SOLN
0.5000 mg | Freq: Once | INTRAMUSCULAR | Status: AC
  Administered 2013-01-17: 0.5 mg via INTRAVENOUS

## 2013-01-17 MED ORDER — ACETAMINOPHEN 650 MG RE SUPP: 650.0000 mg | Freq: Four times a day (QID) | RECTAL | Status: DC | PRN

## 2013-01-17 MED ORDER — ACETAMINOPHEN 325 MG PO TABS: 650.0000 mg | ORAL_TABLET | Freq: Four times a day (QID) | ORAL | Status: DC | PRN

## 2013-01-17 MED ORDER — CHLORHEXIDINE GLUCONATE CLOTH 2 % EX PADS
6.0000 | MEDICATED_PAD | Freq: Every day | CUTANEOUS | Status: AC
  Administered 2013-01-18 – 2013-01-22 (×5): 6 via TOPICAL

## 2013-01-17 MED ORDER — MUPIROCIN 2 % EX OINT
1.0000 "application " | TOPICAL_OINTMENT | Freq: Two times a day (BID) | CUTANEOUS | Status: AC
  Administered 2013-01-17 – 2013-01-22 (×10): 1 via NASAL
  Filled 2013-01-17: qty 22

## 2013-01-17 MED ORDER — INSULIN ASPART 100 UNIT/ML ~~LOC~~ SOLN
0.0000 [IU] | Freq: Three times a day (TID) | SUBCUTANEOUS | Status: DC
  Administered 2013-01-17 – 2013-01-19 (×4): 2 [IU] via SUBCUTANEOUS
  Administered 2013-01-19 – 2013-01-20 (×2): 3 [IU] via SUBCUTANEOUS
  Administered 2013-01-21: 2 [IU] via SUBCUTANEOUS
  Administered 2013-01-22: 12:00:00 3 [IU] via SUBCUTANEOUS
  Administered 2013-01-22 (×2): 2 [IU] via SUBCUTANEOUS
  Administered 2013-01-23: 13:00:00 3 [IU] via SUBCUTANEOUS
  Administered 2013-01-24: 09:00:00 2 [IU] via SUBCUTANEOUS
  Administered 2013-01-24 – 2013-01-25 (×2): 3 [IU] via SUBCUTANEOUS
  Administered 2013-01-25: 2 [IU] via SUBCUTANEOUS

## 2013-01-17 MED ORDER — SODIUM CHLORIDE 0.9 % IV SOLN
INTRAVENOUS | Status: DC
  Administered 2013-01-17: 02:00:00 via INTRAVENOUS

## 2013-01-17 MED ORDER — PREDNISONE 5 MG PO TABS
5.0000 mg | ORAL_TABLET | Freq: Every day | ORAL | Status: DC
  Administered 2013-01-17 – 2013-01-25 (×9): 5 mg via ORAL
  Filled 2013-01-17 (×13): qty 1

## 2013-01-17 MED ORDER — ONDANSETRON HCL 4 MG/2ML IJ SOLN
4.0000 mg | Freq: Four times a day (QID) | INTRAMUSCULAR | Status: DC | PRN
  Filled 2013-01-17: qty 2

## 2013-01-17 MED ORDER — ONDANSETRON HCL 4 MG PO TABS: 4.0000 mg | ORAL_TABLET | Freq: Four times a day (QID) | ORAL | Status: DC | PRN

## 2013-01-17 MED ORDER — GLUCERNA SHAKE PO LIQD
237.0000 mL | Freq: Two times a day (BID) | ORAL | Status: DC
  Administered 2013-01-18 – 2013-01-25 (×12): 237 mL via ORAL
  Filled 2013-01-17 (×6): qty 237

## 2013-01-17 MED ORDER — GADOBENATE DIMEGLUMINE 529 MG/ML IV SOLN
20.0000 mL | Freq: Once | INTRAVENOUS | Status: AC | PRN
  Administered 2013-01-17: 20 mL via INTRAVENOUS

## 2013-01-17 MED ORDER — INSULIN ASPART 100 UNIT/ML ~~LOC~~ SOLN: 0.0000 [IU] | Freq: Every day | SUBCUTANEOUS | Status: DC

## 2013-01-17 MED ORDER — OXYCODONE HCL 5 MG PO TABS
5.0000 mg | ORAL_TABLET | ORAL | Status: DC | PRN
  Administered 2013-01-17 – 2013-01-25 (×19): 5 mg via ORAL
  Filled 2013-01-17 (×20): qty 1

## 2013-01-17 MED ORDER — HYDROMORPHONE HCL PF 1 MG/ML IJ SOLN
1.0000 mg | INTRAMUSCULAR | Status: DC | PRN
  Administered 2013-01-17 – 2013-01-24 (×21): 1 mg via INTRAVENOUS
  Filled 2013-01-17 (×24): qty 1

## 2013-01-17 MED ORDER — ENOXAPARIN SODIUM 40 MG/0.4ML ~~LOC~~ SOLN
40.0000 mg | SUBCUTANEOUS | Status: DC
  Administered 2013-01-17 – 2013-01-19 (×3): 40 mg via SUBCUTANEOUS
  Filled 2013-01-17 (×4): qty 0.4

## 2013-01-17 NOTE — Progress Notes (Signed)
01/17/2013 6:45 PM  Pt arrived from [facility] via [facility] to 5W08 at approximately 1820.  Pt fully alert and oriented, at the moment is unable to ambulate due to extreme left hip pain.  Pt rates his pain at 7/10.  Pt transferred to bed via [facility] slide.  Pt is currently turned to his left side, and refusing to change positions due to pain.  Full assessment to EPIC.  Vitals stable.  Skin assessment unremarkable, however, since pt refuses to turn, I am unable to assess integrity of left hip.  Buttocks, sacrum, right hip and heels bilaterally are okay.  Bottoms of feet are dry and cracking but skin is intact.  Pt is a high fall risk as he has fallen at least twice in the past six months.  Pt states he lives at home with a sister who is able to assist him to a wheelchair, but the past couple of days he has been unable to move at all.  Pt oriented to room/unit, and was instructed on how to utilize the call bell, to which he verbalized understanding.  Pt also educated on his fall risk and necessary interventions, to which he verbalized understanding.  Red socks and yellow arm band applied.  Bed alarm turned on.  Will continue to monitor patient. Theadora Rama

## 2013-01-17 NOTE — Progress Notes (Signed)
Discussed with Dr. Irene Limbo.  Transfer to [facility] from [facility] per ortho recs.  Left hip septic arthritis, osteo, myositis and microabscesses.  Dr. Irene Limbo spoke with Dr. Ave Filter who recommends IR to aspirate joint and he will consult. HD stable.  Transfer to floor.  Crista Curb, M.D.

## 2013-01-17 NOTE — H&P (Signed)
[facility] History and Physical  Luke [name]:[id number] DOB: 03-26-1961 DOA: 01/16/2013   PCP: Luke Hail, MD  Specialists: None  Chief Complaint: Left hip pain  HPI: Luke [name] is a 52 y.o. male with a past medical history of, diabetes, that was recently diagnosed in June, gout, for which he is not taking any medications, arthritis. He was in his usual state of health until earlier last night when he started having pain on the left side. He was actually sent to the cancer clinic for evaluation of a protein spike. There was a concern that patient may have a gammopathy. And, then multiple x-rays were ordered by the oncologist. They had to manipulate his left hip to obtain the films. And this made the pain quite worse. Patient has not been able to bear weight on that left leg. The pain is quite intense and 10 out of 10 in intensity, especially with movement. Denies any recent falls or injuries. He's lost about 100 pounds since February. Was diagnosed with diabetes in June. He was placed on steroids for the hip pain couple of months ago, and continues to be on low dose steroid. He used to be on insulin for diabetes, but is only on oral medications now.  Home Medications: Prior to Admission medications   Medication Sig Start Date End Date Taking? Authorizing Provider  acetaminophen (TYLENOL) 500 MG tablet Take 500 mg by mouth every 6 (six) hours as needed for pain.   Yes Historical Provider, MD  diphenhydrAMINE (BENADRYL) 25 MG tablet Take 25-50 mg by mouth daily as needed. For itching and allergy symptoms   Yes Historical Provider, MD  gabapentin (NEURONTIN) 300 MG capsule Take 300 mg by mouth at bedtime as needed (for pain).    Yes Historical Provider, MD  HYDROcodone-acetaminophen (NORCO/VICODIN) 5-325 MG per tablet Take 1 tablet by mouth every 6 (six) hours as needed for pain.  10/21/12  Yes Elliot Cousin, MD  metFORMIN (GLUCOPHAGE) 500 MG tablet Take 500 mg by mouth 2 (two)  times daily with a meal.   Yes Historical Provider, MD  Multiple Vitamin (MULTIVITAMIN WITH MINERALS) TABS Take 1 tablet by mouth daily. 10/21/12  Yes Elliot Cousin, MD  predniSONE (DELTASONE) 5 MG tablet Take 5 mg by mouth daily.   Yes Historical Provider, MD  diclofenac sodium (VOLTAREN) 1 % GEL Apply 2 g topically 3 (three) times daily as needed (arthritis pain). For arthritis pain. 10/21/12   Elliot Cousin, MD    Allergies:  Allergies  Allergen Reactions  . Bee Venom Swelling  . Shellfish Allergy Nausea And Vomiting    Past Medical History: Past Medical History  Diagnosis Date  . DM (diabetes mellitus)   . Unintentional weight loss 10/16/2012  . DM (diabetes mellitus) type 2, uncontrolled, with ketoacidosis 10/16/2012    DKA  . Gout     Question diagnosis  . Protein-calorie malnutrition, severe 10/20/2012  . Cervical stenosis of spine 10/20/2012    Mild per CT  . Right inguinal hernia 10/20/2012    Large, without obstruction  . Cholelithiasis 10/20/2012  . 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 Chronic inflammatory arthritis 10/21/2012  . Pain, joint, multiple sites     Past Surgical History  Procedure Laterality Date  . No past surgeries      Social History:  reports that he has been smoking.  He does not have any smokeless tobacco history on file. He reports that he does not drink alcohol or use  illicit drugs.  Living Situation: He lives with his father Activity Level: Uses a cane to ambulate usually   Family History:  Family History  Problem Relation Age of Onset  . Arthritis    . Diabetes Brother   . Diabetes Brother      Review of Systems - History obtained from the patient General ROS: positive for  - fatigue Psychological ROS: negative Ophthalmic ROS: negative ENT ROS: negative Allergy and Immunology ROS: negative Hematological and Lymphatic ROS: negative Endocrine ROS: negative Respiratory ROS: no cough, shortness of breath, or  wheezing Cardiovascular ROS: no chest pain or dyspnea on exertion Gastrointestinal ROS: no abdominal pain, change in bowel habits, or black or bloody stools Genito-Urinary ROS: no dysuria, trouble voiding, or hematuria Musculoskeletal ROS: as in hpi Neurological ROS: no TIA or stroke symptoms Dermatological ROS: negative  Physical Examination  Filed Vitals:   01/16/13 1829 01/17/13 0043  BP: 138/76 145/70  Pulse: 113 101  Temp: 98.4 F (36.9 C) 98.6 F (37 C)  TempSrc: Oral Oral  Resp: 20 20  Height: 5\' 6"  (1.676 m)   Weight: 102.059 kg (225 lb)   SpO2: 100% 99%    General appearance: alert, cooperative, appears stated age and no distress Head: Normocephalic, without obvious abnormality, atraumatic Eyes: conjunctivae/corneas clear. PERRL, EOM's intact.  Throat: lips, mucosa, and tongue normal; teeth and gums normal Neck: no adenopathy, no carotid bruit, no JVD, supple, symmetrical, trachea midline and thyroid not enlarged, symmetric, no tenderness/mass/nodules Resp: clear to auscultation bilaterally Cardio: regular rate and rhythm, S1, S2 normal. Systolic murmur at precordium .no click, rub or gallop GI: soft, non-tender; bowel sounds normal; no masses,  no organomegaly Extremities: unable to move the left leg due to pain. 1+ pedal edema. Pulses: poorly palpable in LE but present Skin: Skin color, texture, turgor normal. No rashes or lesions Lymph nodes: Cervical, supraclavicular, and axillary nodes normal. Neurologic: Alert and oriented x3. No focal deficits.  Laboratory Data: Results for orders placed during the hospital encounter of 01/16/13 (from the past 48 hour(s))  GLUCOSE, CAPILLARY     Status: None   Collection Time    01/16/13  6:34 PM      Result Value Range   Glucose-Capillary 89  70 - 99 mg/dL  CBC WITH DIFFERENTIAL     Status: Abnormal   Collection Time    01/16/13  9:08 PM      Result Value Range   WBC 14.3 (*) 4.0 - 10.5 K/uL   RBC 4.33  4.22 - 5.81  MIL/uL   Hemoglobin 11.6 (*) 13.0 - 17.0 g/dL   HCT 16.1 (*) 09.6 - 04.5 %   MCV 83.8  78.0 - 100.0 fL   MCH 26.8  26.0 - 34.0 pg   MCHC 32.0  30.0 - 36.0 g/dL   RDW 40.9 (*) 81.1 - 91.4 %   Platelets 350  150 - 400 K/uL   Neutrophils Relative % 80 (*) 43 - 77 %   Neutro Abs 11.4 (*) 1.7 - 7.7 K/uL   Lymphocytes Relative 14  12 - 46 %   Lymphs Abs 2.0  0.7 - 4.0 K/uL   Monocytes Relative 6  3 - 12 %   Monocytes Absolute 0.9  0.1 - 1.0 K/uL   Eosinophils Relative 0  0 - 5 %   Eosinophils Absolute 0.0  0.0 - 0.7 K/uL   Basophils Relative 0  0 - 1 %   Basophils Absolute 0.0  0.0 - 0.1  K/uL  BASIC METABOLIC PANEL     Status: Abnormal   Collection Time    01/16/13  9:08 PM      Result Value Range   Sodium 136  135 - 145 mEq/L   Potassium 3.5  3.5 - 5.1 mEq/L   Chloride 99  96 - 112 mEq/L   CO2 24  19 - 32 mEq/L   Glucose, Bld 129 (*) 70 - 99 mg/dL   BUN 11  6 - 23 mg/dL   Creatinine, Ser 9.56  0.50 - 1.35 mg/dL   Calcium 9.8  8.4 - 21.3 mg/dL   GFR calc non Af Amer >90  >90 mL/min   GFR calc Af Amer >90  >90 mL/min   Comment: (NOTE)     The eGFR has been calculated using the CKD EPI equation.     This calculation has not been validated in all clinical situations.     eGFR's persistently <90 mL/min signify possible Chronic Kidney     Disease.    Radiology Reports: Dg Pelvis 1-2 Views  01/16/2013   CLINICAL DATA:  Severe left hip and thigh pain.  EXAM: PELVIS - 1-2 VIEW  COMPARISON:  No priors.  FINDINGS: There is complete loss of the joint space in the left hip with very ill-defined detail of the femoral head in the associated acetabulum, with surrounding soft tissue swelling, highly concerning for potential septic arthritis of the left hip. These features are new compared to recent CT scan 10/19/2012. No acute displaced fractures are identified.  IMPRESSION: 1. Findings are concerning for potential septic arthritis of the left hip joint. This could be further evaluated with MRI  with and without IV gadolinium of the hip if clinically indicated.  These results were called by telephone at the time of interpretation on 01/16/2013 at 10:53 PMto JOSEPH ZAMMIT , who verbally acknowledged these results.   Electronically Signed   By: Trudie Reed M.D.   On: 01/16/2013 22:56   Dg Femur Left  01/16/2013   CLINICAL DATA:  Severe left hip pain.  EXAM: LEFT FEMUR - 2 VIEW  COMPARISON:  Bone survey 01/16/2013.  FINDINGS: Complete loss of joint space in the left hip with poor definition of the bony trabecula in the left femoral head and adjacent acetabular regions, with surrounding ill-defined soft tissue prominence adjacent to the left hip joint, concerning for potential septic arthritis. The more distal aspects of the left femur are intact, without evidence of acute displaced fracture.  IMPRESSION: 1. Findings concerning for potential septic arthritis in the left hip joint. This could be confirmed with MRI of the left hip with and without IV gadolinium if clinically indicated. These results were called by telephone at the time of interpretation on 01/16/2013 at 10:55 PMto JOSEPH ZAMMIT , who verbally acknowledged these results.   Electronically Signed   By: Trudie Reed M.D.   On: 01/16/2013 22:57   Dg Bone Survey Met  01/16/2013   CLINICAL DATA:  Para proteinemia. Evaluate for possible lytic bone lesions secondary to myeloma. Severe left hip pain.  EXAM: METASTATIC BONE SURVEY  COMPARISON:  Thoracic spine radiographs, 10/2012  FINDINGS: No lytic lesions are seen to suggest multiple myeloma.  Bridging enthesophytes span the thoracolumbar spine. There is fusion of the SI joints with the combination of findings consistent with ankylosing spondylitis. The thoracic spine appearance is stable from the prior exam.  There are marked degenerative changes in the lower cervical spine.  Advanced arthropathic changes of  the left hip are reflected by marked joint space narrowing, which is concentric,  with marginal osteophytes around the base of the femoral head. The right hip joint is relatively well maintained.  The bones are diffusely demineralized.  There is a portion of a needle within the medial soft tissues, medial to the right distal femoral metaphysis, which is presumed to be chronic.  IMPRESSION: No lytic lesions are seen to suggest multiple myeloma.   Electronically Signed   By: Amie Portland   On: 01/16/2013 16:52    Electrocardiogram: Sinus rhythm, 96 beats a minute. Normal axis. Intervals are normal. No Q waves. Early repolarization is noted. No concerning ST or T-wave changes otherwise noted. Similar to previous EKG.  Problem List  Principal Problem:   Left hip pain Active Problems:   Diabetes mellitus type 2 in obese   Chronic steroid use   Assessment: This is a 52 year old, African American male, who presents with severe left hip pain. X-ray is concerning for septic arthritis. Differential diagnoses include gouty inflammation. Avascular necrosis is a possibility since he is on steroids.  Plan: #1 left hip pain: MRI will be ordered. Orthopedics will be consulted. Uric acid level will be checked along with ESR and CRP. Total CK will also be checked. He does have some edema in the lower extremities. So Doppler study wll be ordered. Blood cultures will be obtained. We'll hold off on antibiotics till MRI report is available and patient has been seen by orthopedics. He is hemodynamically stable.  #2 diabetes mellitus, type II: Continue with sliding scale coverage.   #3 chronic steroid use. Started 2 months ago for arthritis pain: Continue with prednisone for now.  #4 history of weight loss with protein spike: He can follow up cancer clinic as an outpatient.  #5 pedal edema: Etiology is unclear. venous Doppler. Get echocardiogram.   DVT Prophylaxis: Lovenox Code Status: Full code Family Communication: Discussed the patient  Disposition Plan: Admit to MedSurg   Further  management decisions will depend on results of further testing and patient's response to treatment.  [facility]  [facility] Pager [phone]  If 7PM-7AM, please contact night-coverage [email or web address] Password [facility]  01/17/2013, 12:52 AM

## 2013-01-17 NOTE — Progress Notes (Signed)
UR chart review completed.  

## 2013-01-17 NOTE — Progress Notes (Signed)
Patient with orders to be transferred to [facility] hospital. Report given to [facility]. Report given to Revonda Standard, RN at [facility]. Patient in stable condition upon transfer.

## 2013-01-17 NOTE — Progress Notes (Addendum)
INITIAL NUTRITION ASSESSMENT   INTERVENTION: Glucerna Shake po BID, each supplement provides 220 kcal and 10 grams of protein.  RD will continue to follow for nutrition care  NUTRITION DIAGNOSIS: Increased protein-energy needs related to septic arthritis as evidenced by guidelines for nutrition requirements and chronic inflammatory arthritis  Goal: Pt to meet >/= 90% of their estimated fluid, calorie and protein needs   Monitor:  Percentage meal/supplement intake, wt status, labs  Reason for Assessment: Malnutrition Screen Score = 5  52 y.o. male  Admitting Dx: Left hip pain  ASSESSMENT:  Pt has hx of severe malnutrition. Presented to ED with severe left hip pain. Chronic Steroid treatment.  Metastatic Bone Survey: no lytic lesions identified. Left femur radiology findings: possible septic arthritis in left hip joint. He is being transferred to [facility] due to orthopedic recommendations.  He was admitted due to DKA in June. Assessed by RD 10/16/12 and DM education provided. His wt 242# at that time. Hx of severe wt loss and since June he has additional wt loss of 17#, 7% x 90 days trending toward significant.   He contributes the more recent  weight loss to his time at [facility] re[facility] where he says his eating habits and food choices were more healthy than when he was at home. He describes his appetite as very good at this time and his po intake 100% recent meals.   Patient Active Problem List   Diagnosis Date Noted  . Left hip pain 01/17/2013  . Chronic steroid use 01/17/2013  . 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 Chronic inflammatory arthritis 10/21/2012  . Protein-calorie malnutrition, severe 10/20/2012  . Cervical stenosis of spine 10/20/2012  . Right inguinal hernia 10/20/2012  . Cholelithiasis 10/20/2012  . Neck stiffness 10/19/2012  . Neck pain, bilateral 10/19/2012  . Cellulitis of leg, right  10/19/2012  . Sinus tachycardia 10/18/2012  . Leg edema, right 10/18/2012  . Unintentional weight loss 10/18/2012  . Hyponatremia 10/18/2012  . Cellulitis of left lower extremity 10/18/2012  . DM (diabetes mellitus) type 2, uncontrolled, with ketoacidosis 10/17/2012  . Acute gout 10/17/2012  . Obesity 10/17/2012  . DKA (diabetic ketoacidoses) 10/15/2012  . Diabetes mellitus type 2 in obese 10/15/2012  . Ulna distal fracture 12/22/2010    Height: Ht Readings from Last 1 Encounters:  01/16/13 5\' 6"  (1.676 m)    Weight: Wt Readings from Last 1 Encounters:  01/16/13 225 lb (102.059 kg)    Ideal Body Weight: 142# (64.5 kg)  % Ideal Body Weight: 158%  Wt Readings from Last 10 Encounters:  01/16/13 225 lb (102.059 kg)  10/16/12 241 lb 13.5 oz (109.7 kg)  11/30/11 340 lb (154.223 kg)  09/22/11 320 lb (145.151 kg)  12/22/10 334 lb (151.501 kg)  12/16/10 360 lb (163.295 kg)    Usual Body Weight: 242#  % Usual Body Weight: 93%  BMI:  Body mass index is 36.33 kg/(m^2). Obesity class II  Estimated Nutritional Needs: Kcal: 1800-2200  Protein: 100-120 gr Fluid: >2500 ml/day  Skin: intact  Diet Order: Carb Control  EDUCATION NEEDS: -Education needs addressed   Intake/Output Summary (Last 24 hours) at 01/17/13 1139 Last data filed at 01/17/13 0635  Gross per 24 hour  Intake      0 ml  Output    150 ml  Net   -150 ml    Last BM:  01/16/13  Labs:   Recent Labs Lab 01/16/13  1508 01/16/13 2108 01/17/13 0234  NA 135 136 135  K 3.6 3.5 3.4*  CL 100 99 102  CO2 23 24 24   BUN 11 11 9   CREATININE 0.59 0.54 0.49*  CALCIUM 9.9 9.8 9.2  GLUCOSE 103* 129* 121*    CBG (last 3)   Recent Labs  01/16/13 1834 01/17/13 0301 01/17/13 0747  GLUCAP 89 108* 93    Scheduled Meds: . enoxaparin (LOVENOX) injection  40 mg Subcutaneous Q24H  . insulin aspart  0-15 Units Subcutaneous TID WC  . insulin aspart  0-5 Units Subcutaneous QHS  . predniSONE  5 mg Oral QAC  breakfast    Continuous Infusions: . sodium chloride 100 mL/hr at 01/17/13 0210    Past Medical History  Diagnosis Date  . DM (diabetes mellitus)   . Unintentional weight loss 10/16/2012  . DM (diabetes mellitus) type 2, uncontrolled, with ketoacidosis 10/16/2012    DKA  . Gout     Question diagnosis  . Protein-calorie malnutrition, severe 10/20/2012  . Cervical stenosis of spine 10/20/2012    Mild per CT  . Right inguinal hernia 10/20/2012    Large, without obstruction  . Cholelithiasis 10/20/2012  . 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 Chronic inflammatory arthritis 10/21/2012  . Pain, joint, multiple sites     Past Surgical History  Procedure Laterality Date  . No past surgeries      Royann Shivers MS,RD,LDN,CSG Office: [phone] Pager: [phone]

## 2013-01-17 NOTE — Progress Notes (Addendum)
[facility] PROGRESS NOTE  Luke [name] ZOX:[id number] DOB: 1961/02/05 DOA: 01/16/2013 PCP: Donnetta Hail, MD  Summary: 52 year old man presented with severe left hip pain, initial left femur x-ray was concerning for septic arthritis of the left hip and patient was admitted for further evaluation.  MRI confirmed left hip septic arthritis. Orthopedics recommended transfer to a higher level of care for definitive evaluation and management. Arrangements have been made to transfer to [facility], Dr. Ave Filter will see in consultation. Plan joint aspiration. Discussed with flow manager and Dr. Lendell Caprice.  Assessment/Plan: 1. Septic arthritis, osteomyelitis left hip, left hip myositis with abscesses: Nontoxic, afebrile, no leukocytosis. Discussed with orthopedics here at [facility], they have recommended transfer. Discussed with Dr. Ave Filter, he will see in consultation at [facility]. Recommends attempting to aspirate left hip via interventional radiology, hold antibiotics until joint aspirated. 2. Diabetes mellitus type II: Stable. 3. Abnormal M-spike with back pain, weight loss: concern multiple myeloma, being evaluated as an outpatient by oncology.  4. Possible ankylosing spondylitis Cervical and lumbar spine: Followup with rheumatology as an outpatient.  5. Chronic steroid use   Transfer to [facility] for attempted joint aspiration, orthopedics consultation. Hold abx for now given clinical stability  Pending studies:   BC  TSH  Hemoglobin A1c  C-reactive protein  Code Status: full code DVT prophylaxis: Lovenox, will discontinue pending arthrocentesis Family Communication: none present Disposition Plan:   Brendia Sacks, MD  [facility]  Pager [phone] If 7PM-7AM, please contact night-coverage at [email or web address], password [facility] 01/17/2013, 12:20 PM  LOS: 1 day   Consultants:  Orthopedics  Procedures:    Antibiotics:  None  HPI/Subjective: More comfortable with  pain medication, still has severe pain in his left hip. He reports that his left hip began sometime in July.  Objective: Filed Vitals:   01/16/13 1829 01/17/13 0043 01/17/13 0631  BP: 138/76 145/70 114/47  Pulse: 113 101 90  Temp: 98.4 F (36.9 C) 98.6 F (37 C) 98.6 F (37 C)  TempSrc: Oral Oral Oral  Resp: 20 20 18   Height: 5\' 6"  (1.676 m)    Weight: 102.059 kg (225 lb)    SpO2: 100% 99% 100%    Intake/Output Summary (Last 24 hours) at 01/17/13 1220 Last data filed at 01/17/13 0900  Gross per 24 hour  Intake    360 ml  Output    150 ml  Net    210 ml     Filed Weights   01/16/13 1829  Weight: 102.059 kg (225 lb)    Exam:   Afebrile, vital signs stable  Cardiovascular: Regular rate. No murmur, rub, gallop. 2+ bilateral lower extremity edema.  Respiratory: Clear to auscultation bilaterally. No wheezes, rales, rhonchi. Normal respiratory effort.  Abdomen: Obese.  Left lower extremity: No pain in the foot. Warm, dry. Normal capillary refill. Knee appears unremarkable. Very limited range of movement left hip with severe pain. Pain limits examination.  Data Reviewed:  MRI left hip noted.  Capillary blood sugars stable  Potassium 3.4.  Scheduled Meds: . enoxaparin (LOVENOX) injection  40 mg Subcutaneous Q24H  . insulin aspart  0-15 Units Subcutaneous TID WC  . insulin aspart  0-5 Units Subcutaneous QHS  . predniSONE  5 mg Oral QAC breakfast   Continuous Infusions: . sodium chloride 100 mL/hr at 01/17/13 0210    Principal Problem:   Left hip pain Active Problems:   Diabetes mellitus type 2 in obese   Chronic steroid use   Time spent 40  minutes, greater than 50% in counseling and coordination of care

## 2013-01-17 NOTE — Consult Note (Signed)
Reason for Consult: left hip pain  Referring Physician: Dr Osvaldo Shipper   Luke [name] is an 52 y.o. male.  Luke [name] BJY:[id number] DOB: [birth year] DOA: 01/16/2013   PCP: Donnetta Hail, MD  Specialists: None  Chief Complaint: Left hip pain  HPI: Luke [name] is a 52 y.o. male with a past medical history of, diabetes, that was recently diagnosed in June, gout, for which he is not taking any medications, arthritis. He was in his usual state of health until earlier last night when he started having pain on the left side. He was actually sent to the cancer clinic for evaluation of a protein spike. There was a concern that patient may have a gammopathy. And, then multiple x-rays were ordered by the oncologist. They had to manipulate his left hip to obtain the films. And this made the pain quite worse. Patient has not been able to bear weight on that left leg. The pain is quite intense and 10 out of 10 in intensity, especially with movement. Denies any recent falls or injuries. He's lost about 100 pounds since February. Was diagnosed with diabetes in June. He was placed on steroids for the hip pain couple of months ago, and continues to be on low dose steroid. He used to be on insulin for diabetes, but is only on oral medications now.  Seen by Oncology 01-16-13 REASON FOR REFERRAL: Abnormal M-spike with back pain, weight loss, and lower extremity weakness since June 2014.     HISTORY OF PRESENT ILLNESS:Luke [name] is a 52 y.o. male referred with lower extremity weakness and left hip pain worsening since June 2014 when he was hospitalized with new onset diabetes treated at first with insulin and oral agents and most recently with oral agents alone. Using opiod analgesics.  Fasting blood sugar this AM was 107mg %. Appetite good without nausea, vomiting, headache, cough wheezing but with bilateral lower extremity edema.   positive for fatigue and weight loss .   PAST MEDICAL HISTORY:  has a past  medical history of DM (diabetes mellitus); Unintentional weight loss (10/16/2012); DM (diabetes mellitus) type 2, uncontrolled, with ketoacidosis (10/16/2012); Gout; Protein-calorie malnutrition, severe (10/20/2012); Cervical stenosis of spine (10/20/2012); Right inguinal hernia (10/20/2012); Cholelithiasis (10/20/2012); Ankylosing spondylitis of cervicothoracic region (10/21/2012); Chronic inflammatory arthritis (10/21/2012); and Pain, joint, multiple sites.     Past Medical History  Diagnosis Date  . DM (diabetes mellitus)   . Unintentional weight loss 10/16/2012  . DM (diabetes mellitus) type 2, uncontrolled, with ketoacidosis 10/16/2012    DKA  . Gout     Question diagnosis  . Protein-calorie malnutrition, severe 10/20/2012  . Cervical stenosis of spine 10/20/2012    Mild per CT  . Right inguinal hernia 10/20/2012    Large, without obstruction  . Cholelithiasis 10/20/2012  . 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 Chronic inflammatory arthritis 10/21/2012  . Pain, joint, multiple sites     Past Surgical History  Procedure Laterality Date  . No past surgeries      Family History  Problem Relation Age of Onset  . Arthritis    . Diabetes Brother   . Diabetes Brother     Social History:  reports that he has been smoking.  He does not have any smokeless tobacco history on file. He reports that he does not drink alcohol or use illicit drugs.  Allergies:  Allergies  Allergen Reactions  . Bee Venom Swelling  . Shellfish Allergy Nausea And Vomiting  Medications: I have reviewed the patient's current medications.  Results for orders placed during the hospital encounter of 01/16/13 (from the past 48 hour(s))  GLUCOSE, CAPILLARY     Status: None   Collection Time    01/16/13  6:34 PM      Result Value Range   Glucose-Capillary 89  70 - 99 mg/dL  CBC WITH DIFFERENTIAL     Status: Abnormal   Collection Time    01/16/13  9:08 PM      Result Value Range   WBC 14.3  (*) 4.0 - 10.5 K/uL   RBC 4.33  4.22 - 5.81 MIL/uL   Hemoglobin 11.6 (*) 13.0 - 17.0 g/dL   HCT 21.3 (*) 08.6 - 57.8 %   MCV 83.8  78.0 - 100.0 fL   MCH 26.8  26.0 - 34.0 pg   MCHC 32.0  30.0 - 36.0 g/dL   RDW 46.9 (*) 62.9 - 52.8 %   Platelets 350  150 - 400 K/uL   Neutrophils Relative % 80 (*) 43 - 77 %   Neutro Abs 11.4 (*) 1.7 - 7.7 K/uL   Lymphocytes Relative 14  12 - 46 %   Lymphs Abs 2.0  0.7 - 4.0 K/uL   Monocytes Relative 6  3 - 12 %   Monocytes Absolute 0.9  0.1 - 1.0 K/uL   Eosinophils Relative 0  0 - 5 %   Eosinophils Absolute 0.0  0.0 - 0.7 K/uL   Basophils Relative 0  0 - 1 %   Basophils Absolute 0.0  0.0 - 0.1 K/uL  BASIC METABOLIC PANEL     Status: Abnormal   Collection Time    01/16/13  9:08 PM      Result Value Range   Sodium 136  135 - 145 mEq/L   Potassium 3.5  3.5 - 5.1 mEq/L   Chloride 99  96 - 112 mEq/L   CO2 24  19 - 32 mEq/L   Glucose, Bld 129 (*) 70 - 99 mg/dL   BUN 11  6 - 23 mg/dL   Creatinine, Ser 4.13  0.50 - 1.35 mg/dL   Calcium 9.8  8.4 - 24.4 mg/dL   GFR calc non Af Amer >90  >90 mL/min   GFR calc Af Amer >90  >90 mL/min   Comment: (NOTE)     The eGFR has been calculated using the CKD EPI equation.     This calculation has not been validated in all clinical situations.     eGFR's persistently <90 mL/min signify possible Chronic Kidney     Disease.  CK TOTAL AND CKMB     Status: None   Collection Time    01/17/13  2:03 AM      Result Value Range   Total CK 52  7 - 232 U/L   CK, MB 2.0  0.3 - 4.0 ng/mL   Relative Index RELATIVE INDEX IS INVALID  0.0 - 2.5   Comment: WHEN CK < 100 U/L             SEDIMENTATION RATE     Status: Abnormal   Collection Time    01/17/13  2:03 AM      Result Value Range   Sed Rate 71 (*) 0 - 16 mm/hr  URIC ACID     Status: None   Collection Time    01/17/13  2:03 AM      Result Value Range   Uric Acid, Serum 5.6  4.0 - 7.8 mg/dL  COMPREHENSIVE METABOLIC PANEL     Status: Abnormal   Collection Time     01/17/13  2:34 AM      Result Value Range   Sodium 135  135 - 145 mEq/L   Potassium 3.4 (*) 3.5 - 5.1 mEq/L   Chloride 102  96 - 112 mEq/L   CO2 24  19 - 32 mEq/L   Glucose, Bld 121 (*) 70 - 99 mg/dL   BUN 9  6 - 23 mg/dL   Creatinine, Ser 1.61 (*) 0.50 - 1.35 mg/dL   Calcium 9.2  8.4 - 09.6 mg/dL   Total Protein 7.5  6.0 - 8.3 g/dL   Albumin 2.6 (*) 3.5 - 5.2 g/dL   AST 9  0 - 37 U/L   ALT <5  0 - 53 U/L   Alkaline Phosphatase 73  39 - 117 U/L   Total Bilirubin 0.3  0.3 - 1.2 mg/dL   GFR calc non Af Amer >90  >90 mL/min   GFR calc Af Amer >90  >90 mL/min   Comment: (NOTE)     The eGFR has been calculated using the CKD EPI equation.     This calculation has not been validated in all clinical situations.     eGFR's persistently <90 mL/min signify possible Chronic Kidney     Disease.  CBC     Status: Abnormal   Collection Time    01/17/13  2:34 AM      Result Value Range   WBC 10.2  4.0 - 10.5 K/uL   RBC 4.30  4.22 - 5.81 MIL/uL   Hemoglobin 11.3 (*) 13.0 - 17.0 g/dL   HCT 04.5 (*) 40.9 - 81.1 %   MCV 83.7  78.0 - 100.0 fL   MCH 26.3  26.0 - 34.0 pg   MCHC 31.4  30.0 - 36.0 g/dL   RDW 91.4 (*) 78.2 - 95.6 %   Platelets 328  150 - 400 K/uL  CULTURE, BLOOD (ROUTINE X 2)     Status: None   Collection Time    01/17/13  2:37 AM      Result Value Range   Specimen Description BLOOD RIGHT HAND     Special Requests       Value: BOTTLES DRAWN AEROBIC AND ANAEROBIC AEB 8CC ANA 5CC   Culture NO GROWTH <24 HRS     Report Status PENDING    CULTURE, BLOOD (ROUTINE X 2)     Status: None   Collection Time    01/17/13  2:37 AM      Result Value Range   Specimen Description BLOOD RIGHT HAND     Special Requests       Value: BOTTLES DRAWN AEROBIC AND ANAEROBIC AEB 7CC ANA 5CC   Culture NO GROWTH <24 HRS     Report Status PENDING    GLUCOSE, CAPILLARY     Status: Abnormal   Collection Time    01/17/13  3:01 AM      Result Value Range   Glucose-Capillary 108 (*) 70 - 99 mg/dL    Comment 1 Notify RN    GLUCOSE, CAPILLARY     Status: None   Collection Time    01/17/13  7:47 AM      Result Value Range   Glucose-Capillary 93  70 - 99 mg/dL  GLUCOSE, CAPILLARY     Status: Abnormal   Collection Time    01/17/13 11:27 AM  Result Value Range   Glucose-Capillary 123 (*) 70 - 99 mg/dL   Comment 1 Notify RN      Dg Pelvis 1-2 Views  01/16/2013   CLINICAL DATA:  Severe left hip and thigh pain.  EXAM: PELVIS - 1-2 VIEW  COMPARISON:  No priors.  FINDINGS: There is complete loss of the joint space in the left hip with very ill-defined detail of the femoral head in the associated acetabulum, with surrounding soft tissue swelling, highly concerning for potential septic arthritis of the left hip. These features are new compared to recent CT scan 10/19/2012. No acute displaced fractures are identified.  IMPRESSION: 1. Findings are concerning for potential septic arthritis of the left hip joint. This could be further evaluated with MRI with and without IV gadolinium of the hip if clinically indicated.  These results were called by telephone at the time of interpretation on 01/16/2013 at 10:53 PMto JOSEPH ZAMMIT , who verbally acknowledged these results.   Electronically Signed   By: Trudie Reed M.D.   On: 01/16/2013 22:56   Dg Femur Left  01/16/2013   CLINICAL DATA:  Severe left hip pain.  EXAM: LEFT FEMUR - 2 VIEW  COMPARISON:  Bone survey 01/16/2013.  FINDINGS: Complete loss of joint space in the left hip with poor definition of the bony trabecula in the left femoral head and adjacent acetabular regions, with surrounding ill-defined soft tissue prominence adjacent to the left hip joint, concerning for potential septic arthritis. The more distal aspects of the left femur are intact, without evidence of acute displaced fracture.  IMPRESSION: 1. Findings concerning for potential septic arthritis in the left hip joint. This could be confirmed with MRI of the left hip with and without IV  gadolinium if clinically indicated. These results were called by telephone at the time of interpretation on 01/16/2013 at 10:55 PMto JOSEPH ZAMMIT , who verbally acknowledged these results.   Electronically Signed   By: Trudie Reed M.D.   On: 01/16/2013 22:57   Mr Hip Left W Wo Contrast  01/17/2013   *RADIOLOGY REPORT*  Clinical Data: Severe left hip pain.  MRI OF THE LEFT HIP WITHOUT AND WITH CONTRAST  Technique:  Multiplanar, multisequence MR imaging was performed both before and after administration of intravenous contrast.  Contrast: 20mL MULTIHANCE GADOBENATE DIMEGLUMINE 529 MG/ML IV SOLN  Comparison: Radiographs 01/16/2013.  Findings: Constellation of findings is present compatible with septic arthritis of the left hip.  Erosive changes of the left femoral head and acetabulum are present.  There is severe phlegmon surrounding the superficial and deep aspects of the left hip joint. Myositis of the left iliopsoas muscle and left gluteal muscles is present. Small left there is a left hip effusion and synovitis of the left hip joint.  There is a small elongated fluid collection anterior to the left hip joint tracking along the iliopsoas tendon which may represent septic bursitis or a small abscess.  On axial imaging, this measures 16 mm x 11 mm and the craniocaudal extent is 4.4 cm. Small abscess is present in the left iliacus muscle measuring 6 mm transverse by 3.5 mm AP.  Craniocaudal extent is 2.3 cm.  There is edema and enhancement tracking back along the left psoas muscle but no convincing evidence of vertebral osteomyelitis in the visualized lumbar spine.  There is infectious myositis of the left gluteal muscles, of the left adductor compartment, and the left obturator internus and externus.  There is a small bowel containing right  inguinal hernia with some fluid in the hernia sac. Incidental visualization of the right hip and right hip girdle musculature appears normal.  IMPRESSION: Constellation of  findings compatible with septic arthritis of the left hip with osteomyelitis of the left femoral head and left acetabulum.  Infectious myositis surrounds the left hip and there are small abscesses in the left iliacus muscle and lateral to the left iliopsoas tendon.These results were called by telephone on 01/17/2013 at 1200 hours to Dr. Irene Limbo, who verbally acknowledged these results.   Original Report Authenticated By: Andreas Newport, M.D.   Dg Bone Survey Met  01/16/2013   CLINICAL DATA:  Para proteinemia. Evaluate for possible lytic bone lesions secondary to myeloma. Severe left hip pain.  EXAM: METASTATIC BONE SURVEY  COMPARISON:  Thoracic spine radiographs, 10/2012  FINDINGS: No lytic lesions are seen to suggest multiple myeloma.  Bridging enthesophytes span the thoracolumbar spine. There is fusion of the SI joints with the combination of findings consistent with ankylosing spondylitis. The thoracic spine appearance is stable from the prior exam.  There are marked degenerative changes in the lower cervical spine.  Advanced arthropathic changes of the left hip are reflected by marked joint space narrowing, which is concentric, with marginal osteophytes around the base of the femoral head. The right hip joint is relatively well maintained.  The bones are diffusely demineralized.  There is a portion of a needle within the medial soft tissues, medial to the right distal femoral metaphysis, which is presumed to be chronic.  IMPRESSION: No lytic lesions are seen to suggest multiple myeloma.   Electronically Signed   By: Amie Portland   On: 01/16/2013 16:52    ROS Blood pressure 114/47, pulse 90, temperature 98.6 F (37 C), temperature source Oral, resp. rate 18, height 5\' 6"  (1.676 m), weight 225 lb (102.059 kg), SpO2 100.00%. Physical Exam  Assessment/Plan: Diagnosis #1 septic arthritis left hip  This patient is severely ill at this time. He will need  Care at a tertiary care facility to evaluate and  treat his septic arthritis which will require surgical intervention most likely. He will need infectious disease consultation and close medical management as well as oncology consult to address his protein increase and his diabetes.  Note I did not get to see this patient as I was in surgery however I did review his medical records and imaging studies and have discussed with Dr. Irene Limbo recommending transfer to a higher level of care    Fuller Canada 01/17/2013, 12:47 PM

## 2013-01-17 NOTE — Progress Notes (Signed)
Patient given Oxycodone 5mg  due to left hip pain. Patient given Dilaudid 1mg  for pre MRI. While patient in MRI, staff called and stated that patient unable to do MRI due to patient unable to lay flat due to pain. Dr. Irene Limbo notified of other pain meds given too. New orders for one time dose Dilaudid 0.5mg  IV. Patietn able to complete MRI.

## 2013-01-17 NOTE — Care Management Note (Addendum)
    Page 1 of 2   01/25/2013     12:02:21 PM   CARE MANAGEMENT NOTE 01/25/2013  Patient:  Luke [name], Luke [name]   Account Number:  [id number]  Date Initiated:  01/17/2013  Documentation initiated by:  Sharrie Rothman  Subjective/Objective Assessment:   Pt admitted from home with hip pain. Pt is currently living with his sister and pt will return home with sister at discharge. Pt is active with [facility] and has a w/c, BSC, walker, and Dm supplies at home.     Action/Plan:   CM will arrange resumption of HH at discharge. No other CM needs noted.  9/24- pt eval- recs SNF   Anticipated DC Date:  01/26/2013   Anticipated DC Plan:  SKILLED NURSING FACILITY  In-house referral  Clinical Social Worker      DC Planning Services  CM consult      Choice offered to / List presented to:          [facility] arranged  HH-1 RN  HH-2 PT      [facility] agency  [facility]   Status of service:  Completed, signed off Medicare Important Message given?   (If response is "NO", the following Medicare IM given date fields will be blank) Date Medicare IM given:   Date Additional Medicare IM given:    Discharge Disposition:  SKILLED NURSING FACILITY  Per UR Regulation:  Reviewed for med. necessity/level of care/duration of stay  If discussed at Long Length of Stay Meetings, dates discussed:   01/23/2013    Comments:  01/25/13 12:00 Letha Cape RN, BSN [phone] patient is for dc to SNF today, CSW following.  01/24/13 14:03 Letha Cape RN, BSN [phone] patient received pt eval today and they rec SNF, CSW referral.   01/23/13 Letha Cape RN, BSN [phone] per ortho , findings are not consistent with infection , will observe off abx, if final cx's are negative will dc home and f/u with id and ortho as outpt.  01/19/13 16:29 Letha Cape RN, BSN [phone] patient is active with [facility] for RN and Pt,  will need resume orders at dc.  01/17/13 1140 Arlyss Queen, RN BSN CM

## 2013-01-18 LAB — URINE MICROSCOPIC-ADD ON

## 2013-01-18 LAB — GLUCOSE, CAPILLARY
Glucose-Capillary: 121 mg/dL — ABNORMAL HIGH (ref 70–99)
Glucose-Capillary: 111 mg/dL — ABNORMAL HIGH (ref 70–99)
Glucose-Capillary: 160 mg/dL — ABNORMAL HIGH (ref 70–99)

## 2013-01-18 LAB — URINALYSIS, ROUTINE W REFLEX MICROSCOPIC
Bilirubin Urine: NEGATIVE
Nitrite: NEGATIVE
Specific Gravity, Urine: 1.011 (ref 1.005–1.030)
Urobilinogen, UA: 2 mg/dL — ABNORMAL HIGH (ref 0.0–1.0)
pH: 8 (ref 5.0–8.0)

## 2013-01-18 MED ORDER — METHOCARBAMOL 500 MG PO TABS
500.0000 mg | ORAL_TABLET | Freq: Once | ORAL | Status: AC
  Administered 2013-01-18: 500 mg via ORAL
  Filled 2013-01-18: qty 1

## 2013-01-18 NOTE — Progress Notes (Signed)
[facility] PROGRESS NOTE  Luke [name] UJW:[id number] DOB: 11-02-1960 DOA: 01/16/2013 PCP: Donnetta Hail, MD  HPI/Subjective: Patient resting comfortably on left side in bed. Reports aching in the left hip and upper left leg. Denies numbness, tingling, chills.   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 Increasing L sided hip and lower extremity pain and weakness Pelvic and L Femur x-ray: findings of septic arthritis of L Hip joint MR of L Hip: infectious myositis small abscesses, osteomyelitis  Orthopedics recommended by our hip aspiration and culture then start antibiotics. Will consult ID in a.m. for antibiotics recommendation. Considered immunocompromised, chronic steroid use.   Diabetes Mellitus, Type II  Managed with Metformin Insulin sliding scale: Moderate Carb modified diet  Chronic Steroid Use  Arthritis pain Continue with Prednisone  Code Status: FULL Family Communication: None in the room Disposition Plan: Inpatient at this time   Consultants:  Orthopedics pending    Objective: Filed Vitals:   01/18/13 0810  BP: 129/77  Pulse: 88  Temp: 98.6 F (37 C)  Resp: 18    Intake/Output Summary (Last 24 hours) at 01/18/13 1058 Last data filed at 01/18/13 1000  Gross per 24 hour  Intake    360 ml  Output   2050 ml  Net  -1690 ml   Filed Weights   01/16/13 1829 01/17/13 1848 01/17/13 2327  Weight: 102.059 kg (225 lb) 102.694 kg (226 lb 6.4 oz) 103.057 kg (227 lb 3.2 oz)    Exam:   General:  Well-developed, obese African-American male, in not acute distress  Cardiovascular: RRR, no gallops, rubs, or murmurs heard  Respiratory: CTA bilaterally, no rhonchi, rales, or wheezes  Abdomen: obese but soft, non-tender  Musculoskeletal: 1+ pitting edema, tenderness to palpation in the left upper extremity, limited exam of the left hip  Data Reviewed: Basic Metabolic Panel:  Recent  Labs Lab 01/16/13 1508 01/16/13 2108 01/17/13 0234  NA 135 136 135  K 3.6 3.5 3.4*  CL 100 99 102  CO2 23 24 24   GLUCOSE 103* 129* 121*  BUN 11 11 9   CREATININE 0.59 0.54 0.49*  CALCIUM 9.9 9.8 9.2   Liver Function Tests:  Recent Labs Lab 01/16/13 1508 01/17/13 0234  AST 8 9  ALT 5 <5  ALKPHOS 75 73  BILITOT 0.2* 0.3  PROT 8.3 7.5  ALBUMIN 2.9* 2.6*   No results found for this basename: LIPASE, AMYLASE,  in the last 168 hours No results found for this basename: AMMONIA,  in the last 168 hours CBC:  Recent Labs Lab 01/16/13 1508 01/16/13 2108 01/17/13 0234  WBC 11.7* 14.3* 10.2  NEUTROABS 8.6* 11.4*  --   HGB 11.4* 11.6* 11.3*  HCT 36.3* 36.3* 36.0*  MCV 84.2 83.8 83.7  PLT 380 350 328   Cardiac Enzymes:  Recent Labs Lab 01/17/13 0203  CKTOTAL 52  CKMB 2.0   CBG:  Recent Labs Lab 01/17/13 1127 01/17/13 1647 01/17/13 2259 01/17/13 2326 01/18/13 0750  GLUCAP 123* 85 99 108* 104*    Recent Results (from the past 240 hour(s))  CULTURE, BLOOD (ROUTINE X 2)     Status: None   Collection Time    01/17/13  2:37 AM      Result Value Range Status   Specimen Description BLOOD RIGHT HAND   Final   Special Requests     Final   Value: BOTTLES DRAWN AEROBIC AND ANAEROBIC AEB=8CC ANA=5CC   Culture NO GROWTH  1 DAY   Final   Report Status PENDING   Incomplete  CULTURE, BLOOD (ROUTINE X 2)     Status: None   Collection Time    01/17/13  2:37 AM      Result Value Range Status   Specimen Description BLOOD RIGHT HAND   Final   Special Requests     Final   Value: BOTTLES DRAWN AEROBIC AND ANAEROBIC AEB=7CC ANA=5CC   Culture NO GROWTH 1 DAY   Final   Report Status PENDING   Incomplete  MRSA PCR SCREENING     Status: Abnormal   Collection Time    01/17/13 12:20 PM      Result Value Range Status   MRSA by PCR POSITIVE (*) NEGATIVE Final   Comment:            The GeneXpert MRSA Assay (FDA     approved for NASAL specimens     only), is one component of a      comprehensive MRSA colonization     surveillance program. It is not     intended to diagnose MRSA     infection nor to guide or     monitor treatment for     MRSA infections.     RESULT CALLED TO, READ BACK BY AND VERIFIED WITH:     KNIGHT,C. AT 1618 ON 01/17/2013 BY BAUGHAM,M.     Studies: Dg Pelvis 1-2 Views  01/16/2013   CLINICAL DATA:  Severe left hip and thigh pain.  EXAM: PELVIS - 1-2 VIEW  COMPARISON:  No priors.  FINDINGS: There is complete loss of the joint space in the left hip with very ill-defined detail of the femoral head in the associated acetabulum, with surrounding soft tissue swelling, highly concerning for potential septic arthritis of the left hip. These features are new compared to recent CT scan 10/19/2012. No acute displaced fractures are identified.  IMPRESSION: 1. Findings are concerning for potential septic arthritis of the left hip joint. This could be further evaluated with MRI with and without IV gadolinium of the hip if clinically indicated.  These results were called by telephone at the time of interpretation on 01/16/2013 at 10:53 PMto JOSEPH ZAMMIT , who verbally acknowledged these results.   Electronically Signed   By: Trudie Reed M.D.   On: 01/16/2013 22:56   Dg Femur Left  01/16/2013   CLINICAL DATA:  Severe left hip pain.  EXAM: LEFT FEMUR - 2 VIEW  COMPARISON:  Bone survey 01/16/2013.  FINDINGS: Complete loss of joint space in the left hip with poor definition of the bony trabecula in the left femoral head and adjacent acetabular regions, with surrounding ill-defined soft tissue prominence adjacent to the left hip joint, concerning for potential septic arthritis. The more distal aspects of the left femur are intact, without evidence of acute displaced fracture.  IMPRESSION: 1. Findings concerning for potential septic arthritis in the left hip joint. This could be confirmed with MRI of the left hip with and without IV gadolinium if clinically indicated.  These results were called by telephone at the time of interpretation on 01/16/2013 at 10:55 PMto JOSEPH ZAMMIT , who verbally acknowledged these results.   Electronically Signed   By: Trudie Reed M.D.   On: 01/16/2013 22:57   Mr Hip Left W Wo Contrast  01/17/2013   *RADIOLOGY REPORT*  Clinical Data: Severe left hip pain.  MRI OF THE LEFT HIP WITHOUT AND WITH CONTRAST  Technique:  Multiplanar, multisequence  MR imaging was performed both before and after administration of intravenous contrast.  Contrast: 20mL MULTIHANCE GADOBENATE DIMEGLUMINE 529 MG/ML IV SOLN  Comparison: Radiographs 01/16/2013.  Findings: Constellation of findings is present compatible with septic arthritis of the left hip.  Erosive changes of the left femoral head and acetabulum are present.  There is severe phlegmon surrounding the superficial and deep aspects of the left hip joint. Myositis of the left iliopsoas muscle and left gluteal muscles is present. Small left there is a left hip effusion and synovitis of the left hip joint.  There is a small elongated fluid collection anterior to the left hip joint tracking along the iliopsoas tendon which may represent septic bursitis or a small abscess.  On axial imaging, this measures 16 mm x 11 mm and the craniocaudal extent is 4.4 cm. Small abscess is present in the left iliacus muscle measuring 6 mm transverse by 3.5 mm AP.  Craniocaudal extent is 2.3 cm.  There is edema and enhancement tracking back along the left psoas muscle but no convincing evidence of vertebral osteomyelitis in the visualized lumbar spine.  There is infectious myositis of the left gluteal muscles, of the left adductor compartment, and the left obturator internus and externus.  There is a small bowel containing right inguinal hernia with some fluid in the hernia sac. Incidental visualization of the right hip and right hip girdle musculature appears normal.  IMPRESSION: Constellation of findings compatible with septic  arthritis of the left hip with osteomyelitis of the left femoral head and left acetabulum.  Infectious myositis surrounds the left hip and there are small abscesses in the left iliacus muscle and lateral to the left iliopsoas tendon.These results were called by telephone on 01/17/2013 at 1200 hours to Dr. Irene Limbo, who verbally acknowledged these results.   Original Report Authenticated By: Andreas Newport, M.D.   Dg Bone Survey Met  01/16/2013   CLINICAL DATA:  Para proteinemia. Evaluate for possible lytic bone lesions secondary to myeloma. Severe left hip pain.  EXAM: METASTATIC BONE SURVEY  COMPARISON:  Thoracic spine radiographs, 10/2012  FINDINGS: No lytic lesions are seen to suggest multiple myeloma.  Bridging enthesophytes span the thoracolumbar spine. There is fusion of the SI joints with the combination of findings consistent with ankylosing spondylitis. The thoracic spine appearance is stable from the prior exam.  There are marked degenerative changes in the lower cervical spine.  Advanced arthropathic changes of the left hip are reflected by marked joint space narrowing, which is concentric, with marginal osteophytes around the base of the femoral head. The right hip joint is relatively well maintained.  The bones are diffusely demineralized.  There is a portion of a needle within the medial soft tissues, medial to the right distal femoral metaphysis, which is presumed to be chronic.  IMPRESSION: No lytic lesions are seen to suggest multiple myeloma.   Electronically Signed   By: Amie Portland   On: 01/16/2013 16:52    Scheduled Meds: . Chlorhexidine Gluconate Cloth  6 each Topical Q0600  . enoxaparin (LOVENOX) injection  40 mg Subcutaneous Q24H  . feeding supplement  237 mL Oral BID BM  . insulin aspart  0-15 Units Subcutaneous TID WC  . insulin aspart  0-5 Units Subcutaneous QHS  . mupirocin ointment  1 application Nasal BID  . predniSONE  5 mg Oral QAC breakfast   Continuous Infusions:    Principal Problem:   Septic arthritis of hip Active Problems:   Diabetes mellitus type 2  in obese   Left hip pain   Chronic steroid use    DENNIN, SARA A PA-S  [facility] Pager [phone]. If 7PM-7AM, please contact night-coverage at [email or web address], password [facility] 01/18/2013, 10:58 AM  LOS: 2 days     Addendum  Patient seen and examined, chart and data base reviewed.  I agree with the above assessment and plan.  For full details please see Mrs. DENNIN, SARA PA-S note.   Clint Lipps, MD [facility] Pager: [phone] 01/18/2013, 1:30 PM

## 2013-01-18 NOTE — Consult Note (Addendum)
Reason for Consult: Evaluate left hip infection Referring Physician: [name] is an 52 y.o. male.   HPI: 52 year-old male with a new diagnosis of diabetes about 3 months ago which presented with DKA. He notes that in the last 6 months he has had greater than 100 pounds of weight loss unintentionally. He reportedly has been in some sort of a rehabilitation facility for the last 3 months and tells me that the last time he was able to walk was over 3 months ago. He has mostly been in a wheelchair. Pain radiates from his hip to his knee. He denies any fevers chills nausea vomiting or other signs or symptoms of infection or illness. He denies any history of malignancy. He was transferred from [facility] because the orthopedist on call didn't feel comfortable taking care of him.  Past Medical History  Diagnosis Date  . DM (diabetes mellitus)   . Unintentional weight loss 10/16/2012  . DM (diabetes mellitus) type 2, uncontrolled, with ketoacidosis 10/16/2012    DKA  . Gout     Question diagnosis  . Protein-calorie malnutrition, severe 10/20/2012  . Cervical stenosis of spine 10/20/2012    Mild per CT  . Right inguinal hernia 10/20/2012    Large, without obstruction  . Cholelithiasis 10/20/2012  . 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 Chronic inflammatory arthritis 10/21/2012  . Pain, joint, multiple sites     Past Surgical History  Procedure Laterality Date  . No past surgeries      Family History  Problem Relation Age of Onset  . Arthritis    . Diabetes Brother   . Diabetes Brother     Social History:  reports that he has been smoking.  He does not have any smokeless tobacco history on file. He reports that he does not drink alcohol or use illicit drugs.  Allergies:  Allergies  Allergen Reactions  . Bee Venom Swelling  . Shellfish Allergy Nausea And Vomiting    Medications: I have reviewed the patient's current medications.  Results for  orders placed during the hospital encounter of 01/16/13 (from the past 48 hour(s))  GLUCOSE, CAPILLARY     Status: None   Collection Time    01/16/13  6:34 PM      Result Value Range   Glucose-Capillary 89  70 - 99 mg/dL  CBC WITH DIFFERENTIAL     Status: Abnormal   Collection Time    01/16/13  9:08 PM      Result Value Range   WBC 14.3 (*) 4.0 - 10.5 K/uL   RBC 4.33  4.22 - 5.81 MIL/uL   Hemoglobin 11.6 (*) 13.0 - 17.0 g/dL   HCT 16.1 (*) 09.6 - 04.5 %   MCV 83.8  78.0 - 100.0 fL   MCH 26.8  26.0 - 34.0 pg   MCHC 32.0  30.0 - 36.0 g/dL   RDW 40.9 (*) 81.1 - 91.4 %   Platelets 350  150 - 400 K/uL   Neutrophils Relative % 80 (*) 43 - 77 %   Neutro Abs 11.4 (*) 1.7 - 7.7 K/uL   Lymphocytes Relative 14  12 - 46 %   Lymphs Abs 2.0  0.7 - 4.0 K/uL   Monocytes Relative 6  3 - 12 %   Monocytes Absolute 0.9  0.1 - 1.0 K/uL   Eosinophils Relative 0  0 - 5 %   Eosinophils Absolute 0.0  0.0 - 0.7 K/uL  Basophils Relative 0  0 - 1 %   Basophils Absolute 0.0  0.0 - 0.1 K/uL  BASIC METABOLIC PANEL     Status: Abnormal   Collection Time    01/16/13  9:08 PM      Result Value Range   Sodium 136  135 - 145 mEq/L   Potassium 3.5  3.5 - 5.1 mEq/L   Chloride 99  96 - 112 mEq/L   CO2 24  19 - 32 mEq/L   Glucose, Bld 129 (*) 70 - 99 mg/dL   BUN 11  6 - 23 mg/dL   Creatinine, Ser 1.19  0.50 - 1.35 mg/dL   Calcium 9.8  8.4 - 14.7 mg/dL   GFR calc non Af Amer >90  >90 mL/min   GFR calc Af Amer >90  >90 mL/min   Comment: (NOTE)     The eGFR has been calculated using the CKD EPI equation.     This calculation has not been validated in all clinical situations.     eGFR's persistently <90 mL/min signify possible Chronic Kidney     Disease.  CK TOTAL AND CKMB     Status: None   Collection Time    01/17/13  2:03 AM      Result Value Range   Total CK 52  7 - 232 U/L   CK, MB 2.0  0.3 - 4.0 ng/mL   Relative Index RELATIVE INDEX IS INVALID  0.0 - 2.5   Comment: WHEN CK < 100 U/L              SEDIMENTATION RATE     Status: Abnormal   Collection Time    01/17/13  2:03 AM      Result Value Range   Sed Rate 71 (*) 0 - 16 mm/hr  C-REACTIVE PROTEIN     Status: Abnormal   Collection Time    01/17/13  2:03 AM      Result Value Range   CRP 2.5 (*) <0.60 mg/dL   Comment: Performed at [facility]  URIC ACID     Status: None   Collection Time    01/17/13  2:03 AM      Result Value Range   Uric Acid, Serum 5.6  4.0 - 7.8 mg/dL  TSH     Status: None   Collection Time    01/17/13  2:03 AM      Result Value Range   TSH 1.544  0.350 - 4.500 uIU/mL   Comment: Performed at [facility]  HEMOGLOBIN A1C     Status: Abnormal   Collection Time    01/17/13  2:03 AM      Result Value Range   Hemoglobin A1C 6.6 (*) <5.7 %   Comment: (NOTE)                                                                               According to the ADA Clinical Practice Recommendations for 2011, when     HbA1c is used as a screening test:      >=6.5%   Diagnostic of Diabetes Mellitus               (  if abnormal result is confirmed)     5.7-6.4%   Increased risk of developing Diabetes Mellitus     References:Diagnosis and Classification of Diabetes Mellitus,Diabetes     Care,2011,34(Suppl 1):S62-S69 and Standards of Medical Care in             Diabetes - 2011,Diabetes Care,2011,34 (Suppl 1):S11-S61.   Mean Plasma Glucose 143 (*) <117 mg/dL   Comment: Performed at [facility]  COMPREHENSIVE METABOLIC PANEL     Status: Abnormal   Collection Time    01/17/13  2:34 AM      Result Value Range   Sodium 135  135 - 145 mEq/L   Potassium 3.4 (*) 3.5 - 5.1 mEq/L   Chloride 102  96 - 112 mEq/L   CO2 24  19 - 32 mEq/L   Glucose, Bld 121 (*) 70 - 99 mg/dL   BUN 9  6 - 23 mg/dL   Creatinine, Ser 4.09 (*) 0.50 - 1.35 mg/dL   Calcium 9.2  8.4 - 81.1 mg/dL   Total Protein 7.5  6.0 - 8.3 g/dL   Albumin 2.6 (*) 3.5 - 5.2 g/dL   AST 9  0 - 37 U/L   ALT <5  0 - 53 U/L   Alkaline  Phosphatase 73  39 - 117 U/L   Total Bilirubin 0.3  0.3 - 1.2 mg/dL   GFR calc non Af Amer >90  >90 mL/min   GFR calc Af Amer >90  >90 mL/min   Comment: (NOTE)     The eGFR has been calculated using the CKD EPI equation.     This calculation has not been validated in all clinical situations.     eGFR's persistently <90 mL/min signify possible Chronic Kidney     Disease.  CBC     Status: Abnormal   Collection Time    01/17/13  2:34 AM      Result Value Range   WBC 10.2  4.0 - 10.5 K/uL   RBC 4.30  4.22 - 5.81 MIL/uL   Hemoglobin 11.3 (*) 13.0 - 17.0 g/dL   HCT 91.4 (*) 78.2 - 95.6 %   MCV 83.7  78.0 - 100.0 fL   MCH 26.3  26.0 - 34.0 pg   MCHC 31.4  30.0 - 36.0 g/dL   RDW 21.3 (*) 08.6 - 57.8 %   Platelets 328  150 - 400 K/uL  CULTURE, BLOOD (ROUTINE X 2)     Status: None   Collection Time    01/17/13  2:37 AM      Result Value Range   Specimen Description BLOOD RIGHT HAND     Special Requests       Value: BOTTLES DRAWN AEROBIC AND ANAEROBIC AEB=8CC ANA=5CC   Culture NO GROWTH 1 DAY     Report Status PENDING    CULTURE, BLOOD (ROUTINE X 2)     Status: None   Collection Time    01/17/13  2:37 AM      Result Value Range   Specimen Description BLOOD RIGHT HAND     Special Requests       Value: BOTTLES DRAWN AEROBIC AND ANAEROBIC AEB=7CC ANA=5CC   Culture NO GROWTH 1 DAY     Report Status PENDING    GLUCOSE, CAPILLARY     Status: Abnormal   Collection Time    01/17/13  3:01 AM      Result Value Range   Glucose-Capillary 108 (*) 70 - 99 mg/dL  Comment 1 Notify RN    GLUCOSE, CAPILLARY     Status: None   Collection Time    01/17/13  7:47 AM      Result Value Range   Glucose-Capillary 93  70 - 99 mg/dL  GLUCOSE, CAPILLARY     Status: Abnormal   Collection Time    01/17/13 11:27 AM      Result Value Range   Glucose-Capillary 123 (*) 70 - 99 mg/dL   Comment 1 Notify RN    MRSA PCR SCREENING     Status: Abnormal   Collection Time    01/17/13 12:20 PM      Result  Value Range   MRSA by PCR POSITIVE (*) NEGATIVE   Comment:            The GeneXpert MRSA Assay (FDA     approved for NASAL specimens     only), is one component of a     comprehensive MRSA colonization     surveillance program. It is not     intended to diagnose MRSA     infection nor to guide or     monitor treatment for     MRSA infections.     RESULT CALLED TO, READ BACK BY AND VERIFIED WITH:     KNIGHT,C. AT 1618 ON 01/17/2013 BY BAUGHAM,M.  GLUCOSE, CAPILLARY     Status: None   Collection Time    01/17/13  4:47 PM      Result Value Range   Glucose-Capillary 85  70 - 99 mg/dL  GLUCOSE, CAPILLARY     Status: None   Collection Time    01/17/13 10:59 PM      Result Value Range   Glucose-Capillary 99  70 - 99 mg/dL  GLUCOSE, CAPILLARY     Status: Abnormal   Collection Time    01/17/13 11:26 PM      Result Value Range   Glucose-Capillary 108 (*) 70 - 99 mg/dL  GLUCOSE, CAPILLARY     Status: Abnormal   Collection Time    01/18/13  7:50 AM      Result Value Range   Glucose-Capillary 104 (*) 70 - 99 mg/dL    Dg Pelvis 1-2 Views  01/16/2013   CLINICAL DATA:  Severe left hip and thigh pain.  EXAM: PELVIS - 1-2 VIEW  COMPARISON:  No priors.  FINDINGS: There is complete loss of the joint space in the left hip with very ill-defined detail of the femoral head in the associated acetabulum, with surrounding soft tissue swelling, highly concerning for potential septic arthritis of the left hip. These features are new compared to recent CT scan 10/19/2012. No acute displaced fractures are identified.  IMPRESSION: 1. Findings are concerning for potential septic arthritis of the left hip joint. This could be further evaluated with MRI with and without IV gadolinium of the hip if clinically indicated.  These results were called by telephone at the time of interpretation on 01/16/2013 at 10:53 PMto JOSEPH ZAMMIT , who verbally acknowledged these results.   Electronically Signed   By: Trudie Reed M.D.   On: 01/16/2013 22:56   Dg Femur Left  01/16/2013   CLINICAL DATA:  Severe left hip pain.  EXAM: LEFT FEMUR - 2 VIEW  COMPARISON:  Bone survey 01/16/2013.  FINDINGS: Complete loss of joint space in the left hip with poor definition of the bony trabecula in the left femoral head and adjacent acetabular regions, with surrounding ill-defined soft  tissue prominence adjacent to the left hip joint, concerning for potential septic arthritis. The more distal aspects of the left femur are intact, without evidence of acute displaced fracture.  IMPRESSION: 1. Findings concerning for potential septic arthritis in the left hip joint. This could be confirmed with MRI of the left hip with and without IV gadolinium if clinically indicated. These results were called by telephone at the time of interpretation on 01/16/2013 at 10:55 PMto JOSEPH ZAMMIT , who verbally acknowledged these results.   Electronically Signed   By: Trudie Reed M.D.   On: 01/16/2013 22:57   Mr Hip Left W Wo Contrast  01/17/2013   *RADIOLOGY REPORT*  Clinical Data: Severe left hip pain.  MRI OF THE LEFT HIP WITHOUT AND WITH CONTRAST  Technique:  Multiplanar, multisequence MR imaging was performed both before and after administration of intravenous contrast.  Contrast: 20mL MULTIHANCE GADOBENATE DIMEGLUMINE 529 MG/ML IV SOLN  Comparison: Radiographs 01/16/2013.  Findings: Constellation of findings is present compatible with septic arthritis of the left hip.  Erosive changes of the left femoral head and acetabulum are present.  There is severe phlegmon surrounding the superficial and deep aspects of the left hip joint. Myositis of the left iliopsoas muscle and left gluteal muscles is present. Small left there is a left hip effusion and synovitis of the left hip joint.  There is a small elongated fluid collection anterior to the left hip joint tracking along the iliopsoas tendon which may represent septic bursitis or a small abscess.  On  axial imaging, this measures 16 mm x 11 mm and the craniocaudal extent is 4.4 cm. Small abscess is present in the left iliacus muscle measuring 6 mm transverse by 3.5 mm AP.  Craniocaudal extent is 2.3 cm.  There is edema and enhancement tracking back along the left psoas muscle but no convincing evidence of vertebral osteomyelitis in the visualized lumbar spine.  There is infectious myositis of the left gluteal muscles, of the left adductor compartment, and the left obturator internus and externus.  There is a small bowel containing right inguinal hernia with some fluid in the hernia sac. Incidental visualization of the right hip and right hip girdle musculature appears normal.  IMPRESSION: Constellation of findings compatible with septic arthritis of the left hip with osteomyelitis of the left femoral head and left acetabulum.  Infectious myositis surrounds the left hip and there are small abscesses in the left iliacus muscle and lateral to the left iliopsoas tendon.These results were called by telephone on 01/17/2013 at 1200 hours to Dr. Irene Limbo, who verbally acknowledged these results.   Original Report Authenticated By: Andreas Newport, M.D.   Dg Bone Survey Met  01/16/2013   CLINICAL DATA:  Para proteinemia. Evaluate for possible lytic bone lesions secondary to myeloma. Severe left hip pain.  EXAM: METASTATIC BONE SURVEY  COMPARISON:  Thoracic spine radiographs, 10/2012  FINDINGS: No lytic lesions are seen to suggest multiple myeloma.  Bridging enthesophytes span the thoracolumbar spine. There is fusion of the SI joints with the combination of findings consistent with ankylosing spondylitis. The thoracic spine appearance is stable from the prior exam.  There are marked degenerative changes in the lower cervical spine.  Advanced arthropathic changes of the left hip are reflected by marked joint space narrowing, which is concentric, with marginal osteophytes around the base of the femoral head. The right hip  joint is relatively well maintained.  The bones are diffusely demineralized.  There is a portion of a needle  within the medial soft tissues, medial to the right distal femoral metaphysis, which is presumed to be chronic.  IMPRESSION: No lytic lesions are seen to suggest multiple myeloma.   Electronically Signed   By: Amie Portland   On: 01/16/2013 16:52    Review of Systems  Constitutional: Positive for weight loss. Negative for fever and chills.  All other systems reviewed and are negative.   Blood pressure 129/77, pulse 88, temperature 98.6 F (37 C), temperature source Oral, resp. rate 18, height 5\' 6"  (1.676 m), weight 103.057 kg (227 lb 3.2 oz), SpO2 97.00%. Physical Exam  Constitutional: He is oriented to person, place, and time.  HENT:  Head: Normocephalic.  Eyes: EOM are normal.  Cardiovascular: Intact distal pulses.   Respiratory: Effort normal.  Musculoskeletal:  He is lying on his left side. He will not roll over secondary to pain. He has pain with any attempted log roll of the left lower extremity. No significant erythema or warmth appreciated. Distally he is neurovascularly intact. No knee swelling warmth erythema.  Neurological: He is alert and oriented to person, place, and time.  Skin: Skin is warm and dry.  Psychiatric: He has a normal mood and affect.    Assessment/Plan: Imaging concerning for chronic osteomyelitis and septic joint of the left hip involving the peripelvic musculature. He is afebrile, normal white blood cell count, and only mildly elevated CRP. This likely indicates a chronic nature of his problem. This is chronic in nature and is likely been present greater than 3 months. There is no urgency in surgical treatment. I would recommend holding any antibiotics until a good culture can be obtained. Recommend interventional radiology to aspirate the hip and potentially bone biopsy. Tailor antibiotic choice to cultures. Surgical I&D not emergent and would not  be curative. Ultimately he may require a girdle stone hip resection. Will follow along and once results of cultures known we'll discuss surgical treatment options.  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  Await culture results to determine plan.    Mable Paris 01/18/2013, 11:14 AM

## 2013-01-18 NOTE — Progress Notes (Signed)
Follow-up:  01/17/13  @ 2030: I was ask to see pt s/p transfer from [facility] to [facility] room 5W-08. Luke [name] is a 52 year old man who presented to [facility] w/ c/o severe left hip pain, initial left femur x-ray was concerning for septic arthritis of the left hip and patient was admitted for further evaluation. MRI confirmed septic arthritis and orthopedic service was consulted and recommended tx to [facility] to higher level of care for definitive evaluation and management. At bedside pt noted to be resting quietly w/ eyes closed. He awakens easily and reports his pain is being well managed and he currently is having minimal pain. The pt is afebrile and other VSS. Will continue to monitor closely.

## 2013-01-19 ENCOUNTER — Encounter (HOSPITAL_COMMUNITY): Payer: Self-pay | Admitting: Diagnostic Radiology

## 2013-01-19 ENCOUNTER — Inpatient Hospital Stay (HOSPITAL_COMMUNITY): Payer: BC Managed Care – PPO

## 2013-01-19 DIAGNOSIS — Z113 Encounter for screening for infections with a predominantly sexual mode of transmission: Secondary | ICD-10-CM

## 2013-01-19 DIAGNOSIS — IMO0002 Reserved for concepts with insufficient information to code with codable children: Secondary | ICD-10-CM

## 2013-01-19 DIAGNOSIS — M869 Osteomyelitis, unspecified: Secondary | ICD-10-CM

## 2013-01-19 DIAGNOSIS — M4802 Spinal stenosis, cervical region: Secondary | ICD-10-CM

## 2013-01-19 DIAGNOSIS — M60009 Infective myositis, unspecified site: Secondary | ICD-10-CM

## 2013-01-19 LAB — CBC
HCT: 33.3 % — ABNORMAL LOW (ref 39.0–52.0)
Hemoglobin: 10.7 g/dL — ABNORMAL LOW (ref 13.0–17.0)
RBC: 3.99 MIL/uL — ABNORMAL LOW (ref 4.22–5.81)
WBC: 8.1 10*3/uL (ref 4.0–10.5)

## 2013-01-19 LAB — GLUCOSE, CAPILLARY: Glucose-Capillary: 110 mg/dL — ABNORMAL HIGH (ref 70–99)

## 2013-01-19 LAB — BASIC METABOLIC PANEL
BUN: 6 mg/dL (ref 6–23)
Chloride: 105 mEq/L (ref 96–112)
GFR calc Af Amer: >90 mL/min (ref >90)
GFR calc non Af Amer: >90 mL/min (ref >90)
Potassium: 3.8 mEq/L (ref 3.5–5.1)
Sodium: 141 mEq/L (ref 135–145)

## 2013-01-19 MED ORDER — POTASSIUM CHLORIDE CRYS ER 20 MEQ PO TBCR
40.0000 meq | EXTENDED_RELEASE_TABLET | Freq: Once | ORAL | Status: AC
  Administered 2013-01-19: 14:00:00 40 meq via ORAL
  Filled 2013-01-19: qty 2

## 2013-01-19 NOTE — Progress Notes (Signed)
Patient ID: PACO CISLO, male   DOB: 05-21-1960, 52 y.o.   MRN: [id number]  The patient received a Lovenox dose this morning.   After discussions with Drs. Elmadi and [employer] by telephone, and in light of the clinical suspicion that Mr. Groesbeck hip symptoms and findings are more likely aseptic charcot findings on the basis of low sed rate and lack of leukocytosis, it is felt that hip aspiration tomorrow after holding the AM Lovenox dose is the safest way to proceed.  Dr. Ave Filter felt that overall outcome would not be affected by this delay and that it was reasonable to reduce risk of bleeding complication by holding off hip aspiration in compliance with our anticoagulation guidelines for deep joint aspiration.    I also discussed this with Dr. Bonnielee Haff who will be performing the aspiration tomorrow.  Jeananne Rama, PA will also be confirming that the Lovenox dose is held.

## 2013-01-19 NOTE — Progress Notes (Signed)
PATIENT ID: Luke [name]        Subjective:patient complains of pain in hip that is worse with movement.  Otherwise no complaints or concerns.  Denies feeling feverish, night sweats, chills, or symptoms of infection  Objective:  Filed Vitals:   01/19/13 0604  BP: 154/83  Pulse: 98  Temp: 97.8 F (36.6 C)  Resp: 18     Patient working with a CNA on transferring Pain with any movement of the hip that limits his motion No significant erythema or warmth when examined from standing position Distally neurovascularly intact  Labs:   Recent Labs  01/16/13 1508 01/16/13 2108 01/17/13 0234 01/19/13 0545  HGB 11.4* 11.6* 11.3* 10.7*   Recent Labs  01/17/13 0234 01/19/13 0545  WBC 10.2 8.1  RBC 4.30 3.99*  HCT 36.0* 33.3*  PLT 328 331   Recent Labs  01/17/13 0234 01/19/13 0545  NA 135 141  K 3.4* 3.8  CL 102 105  CO2 24 27  BUN 9 6  CREATININE 0.49* 0.47*  GLUCOSE 121* 126*  CALCIUM 9.2 9.4    Assessment and Plan: Imaging concerning for chronic osteomyelitis and septic joint of the left hip involving the peripelvic musculature.  He is afebrile, normal white blood cell count, and only mildly elevated CRP. This likely indicates a chronic nature of his problem. Given his normal lab work, and negative constitutional symptoms, his left hip pain is more likely secondary a Charcot joint. Again, we recommend interventional radiology to aspirate the hip and bone biopsy if needed to confirm that there is no infection. Hold antibiotics until culture obtained Treatment will be determined based on the results from aspiration

## 2013-01-19 NOTE — Consult Note (Signed)
[facility]    Date of Admission:  01/16/2013  Date of Consult:  01/19/2013  Reason for Consult: Septic hip Referring Physician: Dr. Arthor Captain   HPI: Luke [name] is an 52 y.o. male with recently diagnosed DM, ? Polyarticular arthritis who has apparently has felt poorly and had pain in left hip since June 2014. He has had workup by Rheum,(Dr. Dierdre Forth)  Oncology, Neurology ultimately admitted after intractable left hip pain and inability to bear weight. 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 Ave Filter from Orthopedics has seen the pt and recommended IR guided aspirate of hip joint and bone biopsy. He is concerned that pt might need girdlestone procedure for cure. On exam today the pt is clearly in severe pain and miserable.        Past Medical History  Diagnosis Date  . DM (diabetes mellitus)   . Unintentional weight loss 10/16/2012  . DM (diabetes mellitus) type 2, uncontrolled, with ketoacidosis 10/16/2012    DKA  . Gout     Question diagnosis  . Protein-calorie malnutrition, severe 10/20/2012  . Cervical stenosis of spine 10/20/2012    Mild per CT  . Right inguinal hernia 10/20/2012    Large, without obstruction  . Cholelithiasis 10/20/2012  . 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 Chronic inflammatory arthritis 10/21/2012  . Pain, joint, multiple sites     Past Surgical History  Procedure Laterality Date  . No past surgeries    ergies:   Allergies  Allergen Reactions  . Bee Venom Swelling  . Shellfish Allergy Nausea And Vomiting     Medications: I have reviewed patients current medications as documented in Epic Anti-infectives   None      Social History:  reports  that he has been smoking.  He does not have any smokeless tobacco history on file. He reports that he does not drink alcohol or use illicit drugs.  Family History  Problem Relation Age of Onset  . Arthritis    . Diabetes Brother   . Diabetes Brother     As in HPI and primary teams notes otherwise 12 point review of systems is negative  Blood pressure 146/81, pulse 103, temperature 98.3 F (36.8 C), temperature source Oral, resp. rate 20, height 5\' 6"  (1.676 m), weight 227 lb 3.2 oz (103.057 kg), SpO2 94.00%. General: Alert and awake, oriented x3, not in any acute distress. HEENT: anicteric sclera, pupils reactive to light and accommodation, EOMI, oropharynx clear and without exudate CVS regular rate, normal r,  no murmur rubs or gallops Chest: clear to auscultation bilaterally, no wheezing, rales or rhonchi Abdomen: soft nontender, nondistended, normal bowel sounds, Extremities: his left hip is exquisitely tender to palpation and even palpation of left calf elicits pain  Neuro: he is is so much pain that he appears to have severe LLE weakness   Results for orders placed during the hospital encounter of 01/16/13 (from the past 48 hour(s))  GLUCOSE, CAPILLARY     Status: None   Collection Time    01/17/13 10:59 PM      Result Value Range   Glucose-Capillary 99  70 - 99 mg/dL  GLUCOSE, CAPILLARY     Status: Abnormal  Collection Time    01/17/13 11:26 PM      Result Value Range   Glucose-Capillary 108 (*) 70 - 99 mg/dL  GLUCOSE, CAPILLARY     Status: Abnormal   Collection Time    01/18/13  7:50 AM      Result Value Range   Glucose-Capillary 104 (*) 70 - 99 mg/dL  GLUCOSE, CAPILLARY     Status: Abnormal   Collection Time    01/18/13 11:38 AM      Result Value Range   Glucose-Capillary 121 (*) 70 - 99 mg/dL  URINALYSIS, ROUTINE W REFLEX MICROSCOPIC     Status: Abnormal   Collection Time    01/18/13  4:42 PM      Result Value Range   Color, Urine YELLOW  YELLOW   APPearance  CLEAR  CLEAR   Specific Gravity, Urine 1.011  1.005 - 1.030   pH 8.0  5.0 - 8.0   Glucose, UA NEGATIVE  NEGATIVE mg/dL   Hgb urine dipstick NEGATIVE  NEGATIVE   Bilirubin Urine NEGATIVE  NEGATIVE   Ketones, ur NEGATIVE  NEGATIVE mg/dL   Protein, ur NEGATIVE  NEGATIVE mg/dL   Urobilinogen, UA 2.0 (*) 0.0 - 1.0 mg/dL   Nitrite NEGATIVE  NEGATIVE   Leukocytes, UA SMALL (*) NEGATIVE  URINE MICROSCOPIC-ADD ON     Status: None   Collection Time    01/18/13  4:42 PM      Result Value Range   Squamous Epithelial / LPF RARE  RARE   WBC, UA 3-6  <3 WBC/hpf   RBC / HPF 0-2  <3 RBC/hpf   Bacteria, UA RARE  RARE  GLUCOSE, CAPILLARY     Status: Abnormal   Collection Time    01/18/13  5:44 PM      Result Value Range   Glucose-Capillary 111 (*) 70 - 99 mg/dL  GLUCOSE, CAPILLARY     Status: Abnormal   Collection Time    01/18/13  9:22 PM      Result Value Range   Glucose-Capillary 160 (*) 70 - 99 mg/dL  BASIC METABOLIC PANEL     Status: Abnormal   Collection Time    01/19/13  5:45 AM      Result Value Range   Sodium 141  135 - 145 mEq/L   Potassium 3.8  3.5 - 5.1 mEq/L   Chloride 105  96 - 112 mEq/L   CO2 27  19 - 32 mEq/L   Glucose, Bld 126 (*) 70 - 99 mg/dL   BUN 6  6 - 23 mg/dL   Creatinine, Ser 1.09 (*) 0.50 - 1.35 mg/dL   Calcium 9.4  8.4 - 60.4 mg/dL   GFR calc non Af Amer >90  >90 mL/min   GFR calc Af Amer >90  >90 mL/min   Comment: (NOTE)     The eGFR has been calculated using the CKD EPI equation.     This calculation has not been validated in all clinical situations.     eGFR's persistently <90 mL/min signify possible Chronic Kidney     Disease.  CBC     Status: Abnormal   Collection Time    01/19/13  5:45 AM      Result Value Range   WBC 8.1  4.0 - 10.5 K/uL   RBC 3.99 (*) 4.22 - 5.81 MIL/uL   Hemoglobin 10.7 (*) 13.0 - 17.0 g/dL   HCT 54.0 (*) 98.1 - 19.1 %   MCV 83.5  78.0 - 100.0 fL   MCH 26.8  26.0 - 34.0 pg   MCHC 32.1  30.0 - 36.0 g/dL   RDW 16.1 (*) 09.6 -  15.5 %   Platelets 331  150 - 400 K/uL  GLUCOSE, CAPILLARY     Status: Abnormal   Collection Time    01/19/13  8:28 AM      Result Value Range   Glucose-Capillary 127 (*) 70 - 99 mg/dL  GLUCOSE, CAPILLARY     Status: Abnormal   Collection Time    01/19/13 12:01 PM      Result Value Range   Glucose-Capillary 143 (*) 70 - 99 mg/dL  GLUCOSE, CAPILLARY     Status: Abnormal   Collection Time    01/19/13  5:03 PM      Result Value Range   Glucose-Capillary 170 (*) 70 - 99 mg/dL      Component Value Date/Time   SDES BLOOD RIGHT HAND 01/17/2013 0237   SDES BLOOD RIGHT HAND 01/17/2013 0237   SPECREQUEST BOTTLES DRAWN AEROBIC AND ANAEROBIC AEB=8CC ANA=5CC 01/17/2013 0237   SPECREQUEST BOTTLES DRAWN AEROBIC AND ANAEROBIC AEB=7CC ANA=5CC 01/17/2013 0237   CULT NO GROWTH 2 DAYS 01/17/2013 0237   CULT NO GROWTH 2 DAYS 01/17/2013 0237   REPTSTATUS PENDING 01/17/2013 0237   REPTSTATUS PENDING 01/17/2013 0237   No results found.   Recent Results (from the past 720 hour(s))  CULTURE, BLOOD (ROUTINE X 2)     Status: None   Collection Time    01/17/13  2:37 AM      Result Value Range Status   Specimen Description BLOOD RIGHT HAND   Final   Special Requests     Final   Value: BOTTLES DRAWN AEROBIC AND ANAEROBIC AEB=8CC ANA=5CC   Culture NO GROWTH 2 DAYS   Final   Report Status PENDING   Incomplete  CULTURE, BLOOD (ROUTINE X 2)     Status: None   Collection Time    01/17/13  2:37 AM      Result Value Range Status   Specimen Description BLOOD RIGHT HAND   Final   Special Requests     Final   Value: BOTTLES DRAWN AEROBIC AND ANAEROBIC AEB=7CC ANA=5CC   Culture NO GROWTH 2 DAYS   Final   Report Status PENDING   Incomplete  MRSA PCR SCREENING     Status: Abnormal   Collection Time    01/17/13 12:20 PM      Result Value Range Status   MRSA by PCR POSITIVE (*) NEGATIVE Final   Comment:            The GeneXpert MRSA Assay (FDA     approved for NASAL specimens     only), is one component of a      comprehensive MRSA colonization     surveillance program. It is not     intended to diagnose MRSA     infection nor to guide or     monitor treatment for     MRSA infections.     RESULT CALLED TO, READ BACK BY AND VERIFIED WITH:     KNIGHT,C. AT 1618 ON 01/17/2013 BY BAUGHAM,M.     Impression/Recommendation  #1 Septic arthritis Left hip with osteomyelitis and pyomyositis:  --agREE with NOT giving antibiotics  At present --agree with idea of asking IR to aspirate the hip and if possible also obtain bone biopsy for culture --this  Pt will be unlikely to cure this with  antibiotics absent a surgical intervention. Clearly the morbidity of girdlestone will need to be carefully considered and discussed with the patient. I feel he deserves the most aggressive approach possible to cure this infection provided risks of harm are not too great. I feel bad that this diagnosis has apparently been missed for 3+ months per pt history  #2 Screening: check HIV, hep c  Thank you so much for this interesting consult  [facility] [phone] (pager) [phone] (office) 01/19/2013, 7:39 PM  Paulette Blanch Dam 01/19/2013, 7:39 PM

## 2013-01-19 NOTE — Progress Notes (Signed)
I spoke with Lupita Leash with [facility] she states this patient is not active with [facility], he was with before but not active, [facility] will need to offer choice for [facility] if needed.

## 2013-01-19 NOTE — Progress Notes (Signed)
Pt refused to be turned entire shift stating it was too painful.

## 2013-01-19 NOTE — Progress Notes (Signed)
[facility] PROGRESS NOTE  FEDERICK LEVENE QIH:[id number] DOB: 28-Jan-1961 DOA: 01/16/2013 PCP: Donnetta Hail, MD  HPI/Subjective: Patient laying on right side complaining of left knee pain. Reports continued aching in proximal L lower extremity.   Assessment/Plan:  Septic Arthritis of the L Hip  With osteomyelitis, left hip myositis and abscess.  Increasing L sided hip and lower extremity pain and weakness  Pelvic and L Femur x-ray: findings of septic arthritis of L Hip joint  MR of L Hip: infectious myositis small abscesses, osteomyelitis. IR hip aspiration still pending. Will consult ID for abx, pt considered immunocompromised due to chronic steroid use.  Discussed with Dr. Ave Filter and Dr. Ova Freshwater, patient to have fluoroscopy guided hip aspiration in the morning.  Diabetes Mellitus, Type II  Managed with Metformin  Insulin sliding scale: Moderate  Carb modified diet   Chronic Steroid Use  Arthritis pain  Continue with Prednisone  Code Status: Full Family Communication: None at the time Disposition Plan: Inpatient  Consultants:  Orthopedics  Procedures:  L Hip Joint Aspiration, Pending    Objective: Filed Vitals:   01/19/13 0604  BP: 154/83  Pulse: 98  Temp: 97.8 F (36.6 C)  Resp: 18    Intake/Output Summary (Last 24 hours) at 01/19/13 1036 Last data filed at 01/19/13 8756  Gross per 24 hour  Intake    674 ml  Output   1050 ml  Net   -376 ml   Filed Weights   01/16/13 1829 01/17/13 1848 01/17/13 2327  Weight: 102.059 kg (225 lb) 102.694 kg (226 lb 6.4 oz) 103.057 kg (227 lb 3.2 oz)    Exam:   General:  Well-developed, obese African-American male, in no acute distress, laying on his right side  Cardiovascular: RRR, no gallops, rubs, or murmurs  Respiratory: CTA bilaterally, no rhonchi, rales, or wheezes  Abdomen: obese abdomen  Musculoskeletal: tenderness to deep palpation of the left hip    Data Reviewed: Basic Metabolic  Panel:  Recent Labs Lab 01/16/13 1508 01/16/13 2108 01/17/13 0234 01/19/13 0545  NA 135 136 135 141  K 3.6 3.5 3.4* 3.8  CL 100 99 102 105  CO2 23 24 24 27   GLUCOSE 103* 129* 121* 126*  BUN 11 11 9 6   CREATININE 0.59 0.54 0.49* 0.47*  CALCIUM 9.9 9.8 9.2 9.4   Liver Function Tests:  Recent Labs Lab 01/16/13 1508 01/17/13 0234  AST 8 9  ALT 5 <5  ALKPHOS 75 73  BILITOT 0.2* 0.3  PROT 8.3 7.5  ALBUMIN 2.9* 2.6*   CBC:  Recent Labs Lab 01/16/13 1508 01/16/13 2108 01/17/13 0234 01/19/13 0545  WBC 11.7* 14.3* 10.2 8.1  NEUTROABS 8.6* 11.4*  --   --   HGB 11.4* 11.6* 11.3* 10.7*  HCT 36.3* 36.3* 36.0* 33.3*  MCV 84.2 83.8 83.7 83.5  PLT 380 350 328 331   Cardiac Enzymes:  Recent Labs Lab 01/17/13 0203  CKTOTAL 52  CKMB 2.0   CBG:  Recent Labs Lab 01/18/13 0750 01/18/13 1138 01/18/13 1744 01/18/13 2122 01/19/13 0828  GLUCAP 104* 121* 111* 160* 127*    Recent Results (from the past 240 hour(s))  CULTURE, BLOOD (ROUTINE X 2)     Status: None   Collection Time    01/17/13  2:37 AM      Result Value Range Status   Specimen Description BLOOD RIGHT HAND   Final   Special Requests     Final   Value: BOTTLES DRAWN AEROBIC AND ANAEROBIC AEB=8CC  ANA=5CC   Culture NO GROWTH 1 DAY   Final   Report Status PENDING   Incomplete  CULTURE, BLOOD (ROUTINE X 2)     Status: None   Collection Time    01/17/13  2:37 AM      Result Value Range Status   Specimen Description BLOOD RIGHT HAND   Final   Special Requests     Final   Value: BOTTLES DRAWN AEROBIC AND ANAEROBIC AEB=7CC ANA=5CC   Culture NO GROWTH 1 DAY   Final   Report Status PENDING   Incomplete  MRSA PCR SCREENING     Status: Abnormal   Collection Time    01/17/13 12:20 PM      Result Value Range Status   MRSA by PCR POSITIVE (*) NEGATIVE Final   Comment:            The GeneXpert MRSA Assay (FDA     approved for NASAL specimens     only), is one component of a     comprehensive MRSA  colonization     surveillance program. It is not     intended to diagnose MRSA     infection nor to guide or     monitor treatment for     MRSA infections.     RESULT CALLED TO, READ BACK BY AND VERIFIED WITH:     KNIGHT,C. AT 1618 ON 01/17/2013 BY BAUGHAM,M.     Studies: Mr Hip Left W Wo Contrast  01/17/2013   *RADIOLOGY REPORT*  Clinical Data: Severe left hip pain.  MRI OF THE LEFT HIP WITHOUT AND WITH CONTRAST  Technique:  Multiplanar, multisequence MR imaging was performed both before and after administration of intravenous contrast.  Contrast: 20mL MULTIHANCE GADOBENATE DIMEGLUMINE 529 MG/ML IV SOLN  Comparison: Radiographs 01/16/2013.  Findings: Constellation of findings is present compatible with septic arthritis of the left hip.  Erosive changes of the left femoral head and acetabulum are present.  There is severe phlegmon surrounding the superficial and deep aspects of the left hip joint. Myositis of the left iliopsoas muscle and left gluteal muscles is present. Small left there is a left hip effusion and synovitis of the left hip joint.  There is a small elongated fluid collection anterior to the left hip joint tracking along the iliopsoas tendon which may represent septic bursitis or a small abscess.  On axial imaging, this measures 16 mm x 11 mm and the craniocaudal extent is 4.4 cm. Small abscess is present in the left iliacus muscle measuring 6 mm transverse by 3.5 mm AP.  Craniocaudal extent is 2.3 cm.  There is edema and enhancement tracking back along the left psoas muscle but no convincing evidence of vertebral osteomyelitis in the visualized lumbar spine.  There is infectious myositis of the left gluteal muscles, of the left adductor compartment, and the left obturator internus and externus.  There is a small bowel containing right inguinal hernia with some fluid in the hernia sac. Incidental visualization of the right hip and right hip girdle musculature appears normal.  IMPRESSION:  Constellation of findings compatible with septic arthritis of the left hip with osteomyelitis of the left femoral head and left acetabulum.  Infectious myositis surrounds the left hip and there are small abscesses in the left iliacus muscle and lateral to the left iliopsoas tendon.These results were called by telephone on 01/17/2013 at 1200 hours to Dr. Irene Limbo, who verbally acknowledged these results.   Original Report Authenticated By: Andreas Newport, M.D.  Scheduled Meds: . Chlorhexidine Gluconate Cloth  6 each Topical Q0600  . enoxaparin (LOVENOX) injection  40 mg Subcutaneous Q24H  . feeding supplement  237 mL Oral BID BM  . insulin aspart  0-15 Units Subcutaneous TID WC  . insulin aspart  0-5 Units Subcutaneous QHS  . mupirocin ointment  1 application Nasal BID  . predniSONE  5 mg Oral QAC breakfast   Continuous Infusions:   Principal Problem:   Septic arthritis of hip Active Problems:   Diabetes mellitus type 2 in obese   Left hip pain   Chronic steroid use     DENNIN, SARA A PA-S  [facility] Pager [phone]. If 7PM-7AM, please contact night-coverage at [email or web address], password [facility] 01/19/2013, 10:36 AM  LOS: 3 days      Addendum  Patient seen and examined, chart and data base reviewed.  I agree with the above assessment and plan.  For full details please see Mrs. DENNIN, SARA A PA-S note.  Note is revised and addended by me.   Clint Lipps, MD [facility] Pager: [phone] 01/19/2013, 12:48 PM

## 2013-01-20 ENCOUNTER — Inpatient Hospital Stay (HOSPITAL_COMMUNITY): Payer: BC Managed Care – PPO

## 2013-01-20 LAB — GLUCOSE, CAPILLARY
Glucose-Capillary: 156 mg/dL — ABNORMAL HIGH (ref 70–99)
Glucose-Capillary: 110 mg/dL — ABNORMAL HIGH (ref 70–99)

## 2013-01-20 LAB — BASIC METABOLIC PANEL
BUN: 10 mg/dL (ref 6–23)
CO2: 25 mEq/L (ref 19–32)
Calcium: 9 mg/dL (ref 8.4–10.5)
Chloride: 101 mEq/L (ref 96–112)
Creatinine, Ser: 0.55 mg/dL (ref 0.50–1.35)

## 2013-01-20 LAB — CBC
HCT: 35.1 % — ABNORMAL LOW (ref 39.0–52.0)
MCH: 26.8 pg (ref 26.0–34.0)
MCV: 83.4 fL (ref 78.0–100.0)
RBC: 4.21 MIL/uL — ABNORMAL LOW (ref 4.22–5.81)
WBC: 9.3 10*3/uL (ref 4.0–10.5)

## 2013-01-20 LAB — SYNOVIAL CELL COUNT + DIFF, W/ CRYSTALS
Eosinophils-Synovial: 0 % (ref 0–1)
Lymphocytes-Synovial Fld: 3 % (ref 0–20)
Monocyte-Macrophage-Synovial Fluid: 6 % — ABNORMAL LOW (ref 50–90)

## 2013-01-20 LAB — C-REACTIVE PROTEIN: CRP: 8.4 mg/dL — ABNORMAL HIGH (ref <0.60)

## 2013-01-20 LAB — SEDIMENTATION RATE: Sed Rate: 100 mm/hr — ABNORMAL HIGH (ref 0–16)

## 2013-01-20 MED ORDER — SODIUM CHLORIDE 0.9 % IJ SOLN
3.0000 mL | Freq: Two times a day (BID) | INTRAMUSCULAR | Status: DC
  Administered 2013-01-20 – 2013-01-24 (×7): 3 mL via INTRAVENOUS

## 2013-01-20 NOTE — Procedures (Signed)
L hip aspiration 3 cc serosanguinous No comp

## 2013-01-20 NOTE — Progress Notes (Signed)
[facility]    Subjective: No new complaints   Antibiotics:  Anti-infectives   None      Medications: Scheduled Meds: . Chlorhexidine Gluconate Cloth  6 each Topical Q0600  . feeding supplement  237 mL Oral BID BM  . insulin aspart  0-15 Units Subcutaneous TID WC  . insulin aspart  0-5 Units Subcutaneous QHS  . mupirocin ointment  1 application Nasal BID  . predniSONE  5 mg Oral QAC breakfast   Continuous Infusions:  PRN Meds:.acetaminophen, acetaminophen, HYDROmorphone (DILAUDID) injection, ondansetron (ZOFRAN) IV, ondansetron, oxyCODONE   Objective: Weight change:   Intake/Output Summary (Last 24 hours) at 01/20/13 1619 Last data filed at 01/20/13 1446  Gross per 24 hour  Intake    300 ml  Output   1000 ml  Net   -700 ml   Blood pressure 134/74, pulse 97, temperature 98.6 F (37 C), temperature source Oral, resp. rate 20, height 5\' 6"  (1.676 m), weight 227 lb 3.2 oz (103.057 kg), SpO2 97.00%. Temp:  [98.4 F (36.9 C)-98.9 F (37.2 C)] 98.6 F (37 C) (09/20 1445) Pulse Rate:  [88-115] 97 (09/20 1445) Resp:  [18-20] 20 (09/20 1445) BP: (121-134)/(74-76) 134/74 mmHg (09/20 1445) SpO2:  [97 %-100 %] 97 % (09/20 1445)  Physical Exam: General: Alert and awake, oriented x3, not in any acute distress.  HEENT: anicteric sclera, pupils reactive to light and accommodation, EOMI, oropharynx clear and without exudate  CVS regular rate, normal r, no murmur rubs or gallops  Chest: clear to auscultation bilaterally, no wheezing, rales or rhonchi  Abdomen: soft nontender, nondistended, normal bowel sounds,  Extremities: his left hip is exquisitely tender to palpation Neuro: he is is so much pain that he appears to have severe LLE weakness  Lab Results:  Recent Labs  01/19/13 0545 01/20/13 0420  WBC 8.1 9.3  HGB 10.7* 11.3*  HCT 33.3* 35.1*  PLT 331 360    BMET  Recent Labs  01/19/13 0545 01/20/13 0420  NA 141 137  K 3.8 4.0    CL 105 101  CO2 27 25  GLUCOSE 126* 130*  BUN 6 10  CREATININE 0.47* 0.55  CALCIUM 9.4 9.0    Micro Results: Recent Results (from the past 240 hour(s))  CULTURE, BLOOD (ROUTINE X 2)     Status: None   Collection Time    01/17/13  2:37 AM      Result Value Range Status   Specimen Description BLOOD RIGHT HAND   Final   Special Requests     Final   Value: BOTTLES DRAWN AEROBIC AND ANAEROBIC AEB=8CC ANA=5CC   Culture NO GROWTH 3 DAYS   Final   Report Status PENDING   Incomplete  CULTURE, BLOOD (ROUTINE X 2)     Status: None   Collection Time    01/17/13  2:37 AM      Result Value Range Status   Specimen Description BLOOD RIGHT HAND   Final   Special Requests     Final   Value: BOTTLES DRAWN AEROBIC AND ANAEROBIC AEB=7CC ANA=5CC   Culture NO GROWTH 3 DAYS   Final   Report Status PENDING   Incomplete  MRSA PCR SCREENING     Status: Abnormal   Collection Time    01/17/13 12:20 PM      Result Value Range Status   MRSA by PCR POSITIVE (*) NEGATIVE Final   Comment:  The GeneXpert MRSA Assay (FDA     approved for NASAL specimens     only), is one component of a     comprehensive MRSA colonization     surveillance program. It is not     intended to diagnose MRSA     infection nor to guide or     monitor treatment for     MRSA infections.     RESULT CALLED TO, READ BACK BY AND VERIFIED WITH:     KNIGHT,C. AT 1618 ON 01/17/2013 BY BAUGHAM,M.    Studies/Results: Dg Fluoro Guide Ndl Plc/bx  01/20/2013   CLINICAL DATA:  Left hip joint effusion  EXAM: Left HIP aspiration UNDER FLUOROSCOPY  FLUOROSCOPY TIME:  Dictate in minutes and seconds  PROCEDURE: Overlying skin prepped with Betadine, draped in the usual sterile fashion, and infiltrated locally with buffered Lidocaine. Curved 22 gauge spinal needle advanced to the superolateral margin of the right/left femoral head. Aspiration yielded 3 cc of serosanguineous fluid.  A single image documents needle placement in the left  hip joint.  IMPRESSION: Left hip aspiration.   Electronically Signed   By: Maryclare Bean M.D.   On: 01/20/2013 11:23      Assessment/Plan: JAYKE CAUL is a 52 y.o. male with septic left hip and osteo of femur, pyomyositis   #1 Septic arthritis Left hip with osteomyelitis and pyomyositis: Note Orthopedics is also considering that this might be a Charcot joint  Sp aspiration  DO NOT GIVE ANTIBIOTICS UNTIL WE HAVE A PATHOGEN  IF CURRENT CULTURES UNREVEALING WOULD CONSIDER REPEAT ASPIRATION AND IF OR IS LIKELY THAT WOULD BE THE HIGHEST YIELD FOR JOINT AND BONE CULTURES  #2 Screening: HIV, hep c negative    LOS: 4 days   Acey Lav 01/20/2013, 4:19 PM

## 2013-01-20 NOTE — Progress Notes (Signed)
[facility] PROGRESS NOTE  Luke [name] WUJ:[id number] DOB: 1961-01-14 DOA: 01/16/2013 PCP: Donnetta Hail, MD  HPI/Subjective: Had his left hip aspiration this morning, still complaining about pain.  Assessment/Plan:  Septic Arthritis of the L Hip  With osteomyelitis, left hip myositis and abscess.  Increasing L sided hip and lower extremity pain and weakness  Pelvic and L Femur x-ray: findings of septic arthritis of L Hip joint  MR of L Hip: infectious myositis small abscesses, osteomyelitis. IR hip aspiration still pending. Will consult ID for abx, pt considered immunocompromised due to chronic steroid use.  Synovial fluid cell count is 6574, consistent with inflammation rather than infection. Waiting on recommendation from orthopedics and infectious disease.  Diabetes Mellitus, Type II  Managed with Metformin  Insulin sliding scale: Moderate  Carb modified diet   Chronic Steroid Use  Arthritis pain  Continue with Prednisone  Code Status: Full Family Communication: None at the time Disposition Plan: Inpatient  Consultants:  Orthopedics  Procedures:  L Hip Joint Aspiration, Pending    Objective: Filed Vitals:   01/20/13 1445  BP: 134/74  Pulse: 97  Temp: 98.6 F (37 C)  Resp: 20    Intake/Output Summary (Last 24 hours) at 01/20/13 1608 Last data filed at 01/20/13 1446  Gross per 24 hour  Intake    300 ml  Output   1000 ml  Net   -700 ml   Filed Weights   01/16/13 1829 01/17/13 1848 01/17/13 2327  Weight: 102.059 kg (225 lb) 102.694 kg (226 lb 6.4 oz) 103.057 kg (227 lb 3.2 oz)    Exam:   General:  Well-developed, obese African-American male, in no acute distress, laying on his right side  Cardiovascular: RRR, no gallops, rubs, or murmurs  Respiratory: CTA bilaterally, no rhonchi, rales, or wheezes  Abdomen: obese abdomen  Musculoskeletal: tenderness to deep palpation of the left hip    Data Reviewed: Basic Metabolic  Panel:  Recent Labs Lab 01/16/13 1508 01/16/13 2108 01/17/13 0234 01/19/13 0545 01/20/13 0420  NA 135 136 135 141 137  K 3.6 3.5 3.4* 3.8 4.0  CL 100 99 102 105 101  CO2 23 24 24 27 25   GLUCOSE 103* 129* 121* 126* 130*  BUN 11 11 9 6 10   CREATININE 0.59 0.54 0.49* 0.47* 0.55  CALCIUM 9.9 9.8 9.2 9.4 9.0   Liver Function Tests:  Recent Labs Lab 01/16/13 1508 01/17/13 0234  AST 8 9  ALT 5 <5  ALKPHOS 75 73  BILITOT 0.2* 0.3  PROT 8.3 7.5  ALBUMIN 2.9* 2.6*   CBC:  Recent Labs Lab 01/16/13 1508 01/16/13 2108 01/17/13 0234 01/19/13 0545 01/20/13 0420  WBC 11.7* 14.3* 10.2 8.1 9.3  NEUTROABS 8.6* 11.4*  --   --   --   HGB 11.4* 11.6* 11.3* 10.7* 11.3*  HCT 36.3* 36.3* 36.0* 33.3* 35.1*  MCV 84.2 83.8 83.7 83.5 83.4  PLT 380 350 328 331 360   Cardiac Enzymes:   Recent Labs Lab 01/17/13 0203  CKTOTAL 52  CKMB 2.0   CBG:  Recent Labs Lab 01/19/13 1201 01/19/13 1703 01/19/13 2158 01/20/13 0746 01/20/13 1201  GLUCAP 143* 170* 110* 111* 156*    Recent Results (from the past 240 hour(s))  CULTURE, BLOOD (ROUTINE X 2)     Status: None   Collection Time    01/17/13  2:37 AM      Result Value Range Status   Specimen Description BLOOD RIGHT HAND   Final  Special Requests     Final   Value: BOTTLES DRAWN AEROBIC AND ANAEROBIC AEB=8CC ANA=5CC   Culture NO GROWTH 3 DAYS   Final   Report Status PENDING   Incomplete  CULTURE, BLOOD (ROUTINE X 2)     Status: None   Collection Time    01/17/13  2:37 AM      Result Value Range Status   Specimen Description BLOOD RIGHT HAND   Final   Special Requests     Final   Value: BOTTLES DRAWN AEROBIC AND ANAEROBIC AEB=7CC ANA=5CC   Culture NO GROWTH 3 DAYS   Final   Report Status PENDING   Incomplete  MRSA PCR SCREENING     Status: Abnormal   Collection Time    01/17/13 12:20 PM      Result Value Range Status   MRSA by PCR POSITIVE (*) NEGATIVE Final   Comment:            The GeneXpert MRSA Assay (FDA      approved for NASAL specimens     only), is one component of a     comprehensive MRSA colonization     surveillance program. It is not     intended to diagnose MRSA     infection nor to guide or     monitor treatment for     MRSA infections.     RESULT CALLED TO, READ BACK BY AND VERIFIED WITH:     KNIGHT,C. AT 1618 ON 01/17/2013 BY BAUGHAM,M.     Studies: Dg Fluoro Guide Ndl Plc/bx  01/20/2013   CLINICAL DATA:  Left hip joint effusion  EXAM: Left HIP aspiration UNDER FLUOROSCOPY  FLUOROSCOPY TIME:  Dictate in minutes and seconds  PROCEDURE: Overlying skin prepped with Betadine, draped in the usual sterile fashion, and infiltrated locally with buffered Lidocaine. Curved 22 gauge spinal needle advanced to the superolateral margin of the right/left femoral head. Aspiration yielded 3 cc of serosanguineous fluid.  A single image documents needle placement in the left hip joint.  IMPRESSION: Left hip aspiration.   Electronically Signed   By: Maryclare Bean M.D.   On: 01/20/2013 11:23    Scheduled Meds: . Chlorhexidine Gluconate Cloth  6 each Topical Q0600  . feeding supplement  237 mL Oral BID BM  . insulin aspart  0-15 Units Subcutaneous TID WC  . insulin aspart  0-5 Units Subcutaneous QHS  . mupirocin ointment  1 application Nasal BID  . predniSONE  5 mg Oral QAC breakfast   Continuous Infusions:   Principal Problem:   Septic arthritis of hip Active Problems:   Diabetes mellitus type 2 in obese   Left hip pain   Chronic steroid use     DENNIN, SARA A PA-S  [facility] Pager [phone]. If 7PM-7AM, please contact night-coverage at [email or web address], password [facility] 01/20/2013, 4:08 PM  LOS: 4 days      Addendum  Patient seen and examined, chart and data base reviewed.  I agree with the above assessment and plan.  For full details please see Mrs. DENNIN, SARA A PA-S note.  Note is revised and addended by me.   Clint Lipps, MD [facility] Pager:  [phone] 01/20/2013, 4:08 PM

## 2013-01-20 NOTE — Progress Notes (Signed)
Patient ID: Luke [name], male   DOB: 15-Aug-1960, 52 y.o.   MRN: [id number] Subjective: Patient is resting in bed today reporting continued pain in the left hip, denies any fevers chills or night sweats. He is scheduled for interventional radiology with aspiration of the hip today to see if we can obtain specimen and possibly differentiate between Charcot joint and actual infection.  Objective: The patient remains afebrile the skin over the left hip is not warm to touch and there is no appreciable swelling or erythema he does have significant pain with any attempts at motion  Assessment: I concur with Dr. Ave Filter that we need to await the results of the aspiration and hopefully get fluid or tissue that will help differentiate between infection versus Charcot joint.  Plan: We'll followup after the specimens are obtained with interventional radiology, if the patient does have infection present I would concur that with a three-month history of the symptoms and the findings on the MRI scan a Girdlestone arthroplasty with placement of deep drains would be indicated.

## 2013-01-21 DIAGNOSIS — M109 Gout, unspecified: Secondary | ICD-10-CM

## 2013-01-21 LAB — CBC
Hemoglobin: 11.3 g/dL — ABNORMAL LOW (ref 13.0–17.0)
MCH: 26.5 pg (ref 26.0–34.0)
MCHC: 31.8 g/dL (ref 30.0–36.0)
Platelets: 339 10*3/uL (ref 150–400)
RBC: 4.26 MIL/uL (ref 4.22–5.81)
WBC: 7.8 10*3/uL (ref 4.0–10.5)

## 2013-01-21 LAB — BASIC METABOLIC PANEL
CO2: 25 mEq/L (ref 19–32)
Calcium: 9.4 mg/dL (ref 8.4–10.5)
Glucose, Bld: 113 mg/dL — ABNORMAL HIGH (ref 70–99)
Sodium: 133 mEq/L — ABNORMAL LOW (ref 135–145)

## 2013-01-21 LAB — GLUCOSE, CAPILLARY
Glucose-Capillary: 113 mg/dL — ABNORMAL HIGH (ref 70–99)
Glucose-Capillary: 108 mg/dL — ABNORMAL HIGH (ref 70–99)
Glucose-Capillary: 147 mg/dL — ABNORMAL HIGH (ref 70–99)

## 2013-01-21 NOTE — Progress Notes (Signed)
[facility] PROGRESS NOTE  Luke [name]:[id number] DOB: Sep 26, 1960 DOA: 01/16/2013 PCP: Donnetta Hail, MD  HPI/Subjective: His pain is much better, not on antibiotics till now. CRP/ESR is increasing off of antibiotics, worrisome for infection per ortho  Assessment/Plan:  Septic Arthritis of the L Hip  -With radiological evidence of osteomyelitis, left hip myositis and abscess.  -Increasing L sided hip and lower extremity pain and weakness  -Pelvic and L Femur x-ray: findings of septic arthritis of L Hip joint  -MR of L Hip: infectious myositis small abscesses, osteomyelitis. -Synovial fluid cell count is 6574, Gram stain showed no bacteria, cultures pending. -ID is waiting for culture to start antibiotics, orthopedics is saying is worrisome for infection. -There is some evidence, that patients on chronic steroids tends to have low CRP with infections (study done on COPD patients).  Diabetes Mellitus, Type II  -Managed with Metformin  -Insulin sliding scale: Moderate  -Carb modified diet   Chronic Steroid Use  -Arthritis pain  -Continue with Prednisone  Code Status: Full Family Communication: None at the time Disposition Plan: Inpatient  Consultants:  Orthopedics  Procedures:  L Hip Joint Aspiration, done by IR on 01/20/2013  Antibiotics:  None.  Objective: Filed Vitals:   01/21/13 0519  BP: 122/70  Pulse: 88  Temp: 98.1 F (36.7 C)  Resp: 18    Intake/Output Summary (Last 24 hours) at 01/21/13 1044 Last data filed at 01/21/13 1021  Gross per 24 hour  Intake    243 ml  Output   2650 ml  Net  -2407 ml   Filed Weights   01/16/13 1829 01/17/13 1848 01/17/13 2327  Weight: 102.059 kg (225 lb) 102.694 kg (226 lb 6.4 oz) 103.057 kg (227 lb 3.2 oz)    Exam:   General:  Well-developed, obese African-American male, in no acute distress, laying on his right side  Cardiovascular: RRR, no gallops, rubs, or murmurs  Respiratory: CTA bilaterally,  no rhonchi, rales, or wheezes  Abdomen: obese abdomen  Musculoskeletal: tenderness to deep palpation of the left hip    Data Reviewed: Basic Metabolic Panel:  Recent Labs Lab 01/16/13 2108 01/17/13 0234 01/19/13 0545 01/20/13 0420 01/21/13 0536  NA 136 135 141 137 133*  K 3.5 3.4* 3.8 4.0 4.0  CL 99 102 105 101 99  CO2 24 24 27 25 25   GLUCOSE 129* 121* 126* 130* 113*  BUN 11 9 6 10 9   CREATININE 0.54 0.49* 0.47* 0.55 0.50  CALCIUM 9.8 9.2 9.4 9.0 9.4   Liver Function Tests:  Recent Labs Lab 01/16/13 1508 01/17/13 0234  AST 8 9  ALT 5 <5  ALKPHOS 75 73  BILITOT 0.2* 0.3  PROT 8.3 7.5  ALBUMIN 2.9* 2.6*   CBC:  Recent Labs Lab 01/16/13 1508 01/16/13 2108 01/17/13 0234 01/19/13 0545 01/20/13 0420 01/21/13 0536  WBC 11.7* 14.3* 10.2 8.1 9.3 7.8  NEUTROABS 8.6* 11.4*  --   --   --   --   HGB 11.4* 11.6* 11.3* 10.7* 11.3* 11.3*  HCT 36.3* 36.3* 36.0* 33.3* 35.1* 35.5*  MCV 84.2 83.8 83.7 83.5 83.4 83.3  PLT 380 350 328 331 360 339   Cardiac Enzymes:   Recent Labs Lab 01/17/13 0203  CKTOTAL 52  CKMB 2.0   CBG:  Recent Labs Lab 01/20/13 0746 01/20/13 1201 01/20/13 1747 01/20/13 2138 01/21/13 0750  GLUCAP 111* 156* 110* 111* 113*    Recent Results (from the past 240 hour(s))  CULTURE, BLOOD (ROUTINE X 2)  Status: None   Collection Time    01/17/13  2:37 AM      Result Value Range Status   Specimen Description BLOOD RIGHT HAND   Final   Special Requests     Final   Value: BOTTLES DRAWN AEROBIC AND ANAEROBIC AEB=8CC ANA=5CC   Culture NO GROWTH 4 DAYS   Final   Report Status PENDING   Incomplete  CULTURE, BLOOD (ROUTINE X 2)     Status: None   Collection Time    01/17/13  2:37 AM      Result Value Range Status   Specimen Description BLOOD RIGHT HAND   Final   Special Requests     Final   Value: BOTTLES DRAWN AEROBIC AND ANAEROBIC AEB=7CC ANA=5CC   Culture NO GROWTH 4 DAYS   Final   Report Status PENDING   Incomplete  MRSA PCR  SCREENING     Status: Abnormal   Collection Time    01/17/13 12:20 PM      Result Value Range Status   MRSA by PCR POSITIVE (*) NEGATIVE Final   Comment:            The GeneXpert MRSA Assay (FDA     approved for NASAL specimens     only), is one component of a     comprehensive MRSA colonization     surveillance program. It is not     intended to diagnose MRSA     infection nor to guide or     monitor treatment for     MRSA infections.     RESULT CALLED TO, READ BACK BY AND VERIFIED WITH:     KNIGHT,C. AT 1618 ON 01/17/2013 BY BAUGHAM,M.  BODY FLUID CULTURE     Status: None   Collection Time    01/20/13  8:56 AM      Result Value Range Status   Specimen Description SYNOVIAL RIGHT HIP FLUID   Final   Special Requests NONE   Final   Gram Stain     Final   Value: WBC PRESENT, PREDOMINANTLY PMN     NO ORGANISMS SEEN     Performed at [facility]   Culture     Final   Value: NO GROWTH 1 DAY     Performed at [facility]   Report Status PENDING   Incomplete     Studies: Dg Fluoro Guide Ndl Plc/bx  01/20/2013   CLINICAL DATA:  Left hip joint effusion  EXAM: Left HIP aspiration UNDER FLUOROSCOPY  FLUOROSCOPY TIME:  Dictate in minutes and seconds  PROCEDURE: Overlying skin prepped with Betadine, draped in the usual sterile fashion, and infiltrated locally with buffered Lidocaine. Curved 22 gauge spinal needle advanced to the superolateral margin of the right/left femoral head. Aspiration yielded 3 cc of serosanguineous fluid.  A single image documents needle placement in the left hip joint.  IMPRESSION: Left hip aspiration.   Electronically Signed   By: Maryclare Bean M.D.   On: 01/20/2013 11:23    Scheduled Meds: . Chlorhexidine Gluconate Cloth  6 each Topical Q0600  . feeding supplement  237 mL Oral BID BM  . insulin aspart  0-15 Units Subcutaneous TID WC  . insulin aspart  0-5 Units Subcutaneous QHS  . mupirocin ointment  1 application Nasal BID  . predniSONE  5 mg  Oral QAC breakfast  . sodium chloride  3 mL Intravenous Q12H   Continuous Infusions:   Principal Problem:   Septic  arthritis of hip Active Problems:   Diabetes mellitus type 2 in obese   Left hip pain   Chronic steroid use     DENNIN, SARA A PA-S  [facility] Pager [phone]. If 7PM-7AM, please contact night-coverage at [email or web address], password [facility] 01/21/2013, 10:44 AM  LOS: 5 days      Addendum  Patient seen and examined, chart and data base reviewed.  I agree with the above assessment and plan.  For full details please see Mrs. DENNIN, SARA A PA-S note.  Note is revised and addended by me.   Clint Lipps, MD [facility] Pager: [phone] 01/21/2013, 10:44 AM

## 2013-01-21 NOTE — Progress Notes (Signed)
  Echocardiogram 2D Echocardiogram has been performed.  Luke [name] 01/21/2013, 12:22 PM

## 2013-01-21 NOTE — Progress Notes (Signed)
Subjective: Patient continues to have pain in his left hip, interestingly it hurts a little bit less he is up on a walker, it hurts a great deal he tries to move side to side in the bed. He did have the aspiration of the left hip yesterday with invasive radiology at this time cultures are negative, cell count 6500, 91% polys, Gram stain showed white cells with no organisms. Repeat CRP is gone from 2.5 8.5 and sedimentation rate has gone from 71-100, peripheral white count is 7.8. The patient is not on antibiotics.   Objective: Vital signs in last 24 hours: Temp:  [98.1 F (36.7 C)-98.6 F (37 C)] 98.1 F (36.7 C) (09/21 0519) Pulse Rate:  [88-97] 88 (09/21 0519) Resp:  [18-20] 18 (09/21 0519) BP: (122-134)/(70-74) 122/70 mmHg (09/21 0519) SpO2:  [97 %-98 %] 98 % (09/21 0519)  Intake/Output from previous day: 09/20 0701 - 09/21 0700 In: 243 [P.O.:240; I.V.:3] Out: 2450 [Urine:2450] Intake/Output this shift:     Recent Labs  01/19/13 0545 01/20/13 0420 01/21/13 0536  HGB 10.7* 11.3* 11.3*    Recent Labs  01/20/13 0420 01/21/13 0536  WBC 9.3 7.8  RBC 4.21* 4.26  HCT 35.1* 35.5*  PLT 360 339    Recent Labs  01/20/13 0420 01/21/13 0536  NA 137 133*  K 4.0 4.0  CL 101 99  CO2 25 25  BUN 10 9  CREATININE 0.55 0.50  GLUCOSE 130* 113*  CALCIUM 9.0 9.4   No results found for this basename: LABPT, INR,  in the last 72 hours  Any attempted internal or extra rotation of the left hip causes significant pain, foot tap is 1+ positive traction to the leg actually diminishes his pain. His skin is soft and not particularly warm to touch but he is morbidly obese.  Assessment/Plan: Assessment: the differential at this time is epic arthritis versus Charcot joint. The increasing CRP and sedimentation rate are worrisome and a more consistent with infection at this time. The synovial aspirate is at 24 hours and negative for bacteria at this time.    Clayvon Parlett J 01/21/2013, 7:53  AM

## 2013-01-22 DIAGNOSIS — E1310 Other specified diabetes mellitus with ketoacidosis without coma: Secondary | ICD-10-CM

## 2013-01-22 LAB — GLUCOSE, CAPILLARY
Glucose-Capillary: 123 mg/dL — ABNORMAL HIGH (ref 70–99)
Glucose-Capillary: 144 mg/dL — ABNORMAL HIGH (ref 70–99)
Glucose-Capillary: 87 mg/dL (ref 70–99)

## 2013-01-22 LAB — CULTURE, BLOOD (ROUTINE X 2): Culture: NO GROWTH

## 2013-01-22 LAB — BASIC METABOLIC PANEL
BUN: 20 mg/dL (ref 6–23)
Calcium: 9.6 mg/dL (ref 8.4–10.5)
Creatinine, Ser: 0.85 mg/dL (ref 0.50–1.35)
GFR calc Af Amer: >90 mL/min (ref >90)
GFR calc non Af Amer: >90 mL/min (ref >90)
Glucose, Bld: 132 mg/dL — ABNORMAL HIGH (ref 70–99)
Potassium: 3.8 mEq/L (ref 3.5–5.1)

## 2013-01-22 LAB — CBC
MCH: 26.9 pg (ref 26.0–34.0)
MCHC: 33 g/dL (ref 30.0–36.0)
Platelets: 358 10*3/uL (ref 150–400)
RDW: 16.6 % — ABNORMAL HIGH (ref 11.5–15.5)

## 2013-01-22 NOTE — Progress Notes (Signed)
PATIENT ID: Luke [name]        Subjective: complains of left hip pain.  He says that otherwise he feels fine.  Objective:  Filed Vitals:   01/22/13 0500  BP: 153/78  Pulse: 102  Temp: 98.1 F (36.7 C)  Resp: 22     Lying in the bed in no acute distress.  Left hip is tender to palpation and has pain with any range of motion.  Labs:   Recent Labs  01/20/13 0420 01/21/13 0536 01/22/13 0558  HGB 11.3* 11.3* 11.6*   Recent Labs  01/21/13 0536 01/22/13 0558  WBC 7.8 10.8*  RBC 4.26 4.31  HCT 35.5* 35.2*  PLT 339 358   Recent Labs  01/21/13 0536 01/22/13 0558  NA 133* 133*  K 4.0 3.8  CL 99 97  CO2 25 23  BUN 9 20  CREATININE 0.50 0.85  GLUCOSE 113* 132*  CALCIUM 9.4 9.6    Assessment and Plan: Concern for possible left hip septic arthritis with associated osteomyelitis with unclear clinical picture and workup to date His imaging is concerning for infection, but his laboratory values, and clinical picture have not gone along with that. His aspiration showed WBC count lower than would be expected for infection and Gram stain and culture have been negative so far.  He has not yet received any antibiotics. The alternative diagnosis could be a Charcot joint given his associated diabetes. His white blood cell count and inflammatory markers are now rising. Ultimately we will have to decide if we treat this as infection.  Unfortunately, this is likely to involve a very significant surgery with resection of his hip joint.  Obviously it would be nice to firmly establish the diagnosis before moving forward with this treatment. I will discuss this further with the primary team and with infectious disease and if surgery is felt necessary we may be able to move forward with this tomorrow.

## 2013-01-22 NOTE — Progress Notes (Signed)
INFECTIOUS DISEASE PROGRESS NOTE  ID: Luke [name] is a 52 y.o. male with  Principal Problem:   Septic arthritis of hip Active Problems:   Diabetes mellitus type 2 in obese   Left hip pain   Chronic steroid use  Subjective: Continued hip pain, better with conversing.   Abtx:  Anti-infectives   None      Medications:  Scheduled: . feeding supplement  237 mL Oral BID BM  . insulin aspart  0-15 Units Subcutaneous TID WC  . insulin aspart  0-5 Units Subcutaneous QHS  . predniSONE  5 mg Oral QAC breakfast  . sodium chloride  3 mL Intravenous Q12H    Objective: Vital signs in last 24 hours: Temp:  [98.1 F (36.7 C)-98.4 F (36.9 C)] 98.4 F (36.9 C) (09/22 1400) Pulse Rate:  [80-102] 97 (09/22 1400) Resp:  [18-22] 18 (09/22 1400) BP: (115-153)/(74-78) 115/75 mmHg (09/22 1400) SpO2:  [98 %] 98 % (09/22 1400)   General appearance: alert, cooperative and no distress Resp: clear to auscultation bilaterally Cardio: regular rate and rhythm GI: normal findings: bowel sounds normal and soft, non-tender Extremities: dried skin. slight decrease in light touch on R foot.   Lab Results  Recent Labs  01/21/13 0536 01/22/13 0558  WBC 7.8 10.8*  HGB 11.3* 11.6*  HCT 35.5* 35.2*  NA 133* 133*  K 4.0 3.8  CL 99 97  CO2 25 23  BUN 9 20  CREATININE 0.50 0.85   Liver Panel No results found for this basename: PROT, ALBUMIN, AST, ALT, ALKPHOS, BILITOT, BILIDIR, IBILI,  in the last 72 hours Sedimentation Rate  Recent Labs  01/20/13 0420  ESRSEDRATE 100*   C-Reactive Protein  Recent Labs  01/20/13 0420  CRP 8.4*    Microbiology: Recent Results (from the past 240 hour(s))  CULTURE, BLOOD (ROUTINE X 2)     Status: None   Collection Time    01/17/13  2:37 AM      Result Value Range Status   Specimen Description BLOOD RIGHT HAND   Final   Special Requests     Final   Value: BOTTLES DRAWN AEROBIC AND ANAEROBIC AEB=8CC ANA=5CC   Culture NO GROWTH 5 DAYS    Final   Report Status 01/22/2013 FINAL   Final  CULTURE, BLOOD (ROUTINE X 2)     Status: None   Collection Time    01/17/13  2:37 AM      Result Value Range Status   Specimen Description BLOOD RIGHT HAND   Final   Special Requests     Final   Value: BOTTLES DRAWN AEROBIC AND ANAEROBIC AEB=7CC ANA=5CC   Culture NO GROWTH 5 DAYS   Final   Report Status 01/22/2013 FINAL   Final  MRSA PCR SCREENING     Status: Abnormal   Collection Time    01/17/13 12:20 PM      Result Value Range Status   MRSA by PCR POSITIVE (*) NEGATIVE Final   Comment:            The GeneXpert MRSA Assay (FDA     approved for NASAL specimens     only), is one component of a     comprehensive MRSA colonization     surveillance program. It is not     intended to diagnose MRSA     infection nor to guide or     monitor treatment for     MRSA infections.     RESULT  CALLED TO, READ BACK BY AND VERIFIED WITH:     KNIGHT,C. AT 1618 ON 01/17/2013 BY BAUGHAM,M.  BODY FLUID CULTURE     Status: None   Collection Time    01/20/13  8:56 AM      Result Value Range Status   Specimen Description SYNOVIAL RIGHT HIP FLUID   Final   Special Requests NONE   Final   Gram Stain     Final   Value: WBC PRESENT, PREDOMINANTLY PMN     NO ORGANISMS SEEN     Performed at [facility]   Culture     Final   Value: NO GROWTH 2 DAYS     Performed at [facility]   Report Status PENDING   Incomplete    Studies/Results: No results found.   Assessment/Plan: Septic Arthritis vs Charcot joint (L hip)  Would continue to hold anbx.  Watch his joint cx.  would be glad to see in ID clinic in f/u.   Total days of antibiotics: 0         Luke [name] Infectious Diseases (pager) [phone] [email or web address] 01/22/2013, 5:01 PM  LOS: 6 days

## 2013-01-22 NOTE — Progress Notes (Signed)
[facility] PROGRESS NOTE  Luke [name] ZOX:[id number] DOB: 10/27/1960 DOA: 01/16/2013 PCP: Donnetta Hail, MD   HPI/Subjective: Patient continues to report left proximal lower extremity pain that is a constant ache. He was unable to sleep last night due to the pain. Knee pain has improved from last week.   Assessment/Plan:  Septic Arthritis of the L Hip   With radiological evidence of osteomyelitis, left hip myositis and abscess   Increasing L sided hip and lower extremity pain and weakness   Pelvic and L Femur x-ray: findings of septic arthritis of L Hip joint   MR of L Hip: infectious myositis small abscesses, osteomyelitis  Synovial fluid cell count is 6574, Gram stain showed no bacteria, cultures pending  ID is waiting for culture to start antibiotics, orthopedics is questioning infection due to increasing sed rate (100 up from 71) and CRP (8.4 up from 2.5)  There is some evidence, that patients on chronic steroids tends to have low CRP with infections (study done on COPD patients)  Orthopedic surgery (resection of hip joint) tentitively planned for tomorrow   Diabetes Mellitus, Type II   Managed with Metformin   Insulin sliding scale: Moderate   Carb modified diet   Chronic Steroid Use   Arthritis pain   Continue with Prednisone  Code Status: FULL Family Communication: None Disposition Plan: Inpatient   Consultants:  Infectious Disease  Orthopedics   Procedures:  L Hip Aspiration- 01/20/13   Objective: Filed Vitals:   01/22/13 0500  BP: 153/78  Pulse: 102  Temp: 98.1 F (36.7 C)  Resp: 22    Intake/Output Summary (Last 24 hours) at 01/22/13 0955 Last data filed at 01/22/13 0600  Gross per 24 hour  Intake    240 ml  Output    625 ml  Net   -385 ml   Filed Weights   01/16/13 1829 01/17/13 1848 01/17/13 2327  Weight: 102.059 kg (225 lb) 102.694 kg (226 lb 6.4 oz) 103.057 kg (227 lb 3.2 oz)    Exam:   General:   Well-developed, obese African-American male, lying in bed in no acute distress  Cardiovascular: RRR, no gallops, rubs, or murmurs  Respiratory: CTA bilaterally on anterior exam, patient could not move to listen posteriorly due to pain  Abdomen: soft, obese abdomen, non-tender  Musculoskeletal: no edema, tenderness to palpation of the left hip, normal plantar and dorsal flexion of the left ankle  Data Reviewed: Basic Metabolic Panel:  Recent Labs Lab 01/17/13 0234 01/19/13 0545 01/20/13 0420 01/21/13 0536 01/22/13 0558  NA 135 141 137 133* 133*  K 3.4* 3.8 4.0 4.0 3.8  CL 102 105 101 99 97  CO2 24 27 25 25 23   GLUCOSE 121* 126* 130* 113* 132*  BUN 9 6 10 9 20   CREATININE 0.49* 0.47* 0.55 0.50 0.85  CALCIUM 9.2 9.4 9.0 9.4 9.6   Liver Function Tests:  Recent Labs Lab 01/16/13 1508 01/17/13 0234  AST 8 9  ALT 5 <5  ALKPHOS 75 73  BILITOT 0.2* 0.3  PROT 8.3 7.5  ALBUMIN 2.9* 2.6*   CBC:  Recent Labs Lab 01/16/13 1508 01/16/13 2108 01/17/13 0234 01/19/13 0545 01/20/13 0420 01/21/13 0536 01/22/13 0558  WBC 11.7* 14.3* 10.2 8.1 9.3 7.8 10.8*  NEUTROABS 8.6* 11.4*  --   --   --   --   --   HGB 11.4* 11.6* 11.3* 10.7* 11.3* 11.3* 11.6*  HCT 36.3* 36.3* 36.0* 33.3* 35.1* 35.5* 35.2*  MCV 84.2 83.8  83.7 83.5 83.4 83.3 81.7  PLT 380 350 328 331 360 339 358   Cardiac Enzymes:  Recent Labs Lab 01/17/13 0203  CKTOTAL 52  CKMB 2.0   CBG:  Recent Labs Lab 01/21/13 0750 01/21/13 1220 01/21/13 1824 01/21/13 2118 01/22/13 0756  GLUCAP 113* 143* 108* 147* 123*    Recent Results (from the past 240 hour(s))  CULTURE, BLOOD (ROUTINE X 2)     Status: None   Collection Time    01/17/13  2:37 AM      Result Value Range Status   Specimen Description BLOOD RIGHT HAND   Final   Special Requests     Final   Value: BOTTLES DRAWN AEROBIC AND ANAEROBIC AEB=8CC ANA=5CC   Culture NO GROWTH 4 DAYS   Final   Report Status PENDING   Incomplete  CULTURE, BLOOD  (ROUTINE X 2)     Status: None   Collection Time    01/17/13  2:37 AM      Result Value Range Status   Specimen Description BLOOD RIGHT HAND   Final   Special Requests     Final   Value: BOTTLES DRAWN AEROBIC AND ANAEROBIC AEB=7CC ANA=5CC   Culture NO GROWTH 4 DAYS   Final   Report Status PENDING   Incomplete  MRSA PCR SCREENING     Status: Abnormal   Collection Time    01/17/13 12:20 PM      Result Value Range Status   MRSA by PCR POSITIVE (*) NEGATIVE Final   Comment:            The GeneXpert MRSA Assay (FDA     approved for NASAL specimens     only), is one component of a     comprehensive MRSA colonization     surveillance program. It is not     intended to diagnose MRSA     infection nor to guide or     monitor treatment for     MRSA infections.     RESULT CALLED TO, READ BACK BY AND VERIFIED WITH:     KNIGHT,C. AT 1618 ON 01/17/2013 BY BAUGHAM,M.  BODY FLUID CULTURE     Status: None   Collection Time    01/20/13  8:56 AM      Result Value Range Status   Specimen Description SYNOVIAL RIGHT HIP FLUID   Final   Special Requests NONE   Final   Gram Stain     Final   Value: WBC PRESENT, PREDOMINANTLY PMN     NO ORGANISMS SEEN     Performed at [facility]   Culture     Final   Value: NO GROWTH 1 DAY     Performed at [facility]   Report Status PENDING   Incomplete     Scheduled Meds: . feeding supplement  237 mL Oral BID BM  . insulin aspart  0-15 Units Subcutaneous TID WC  . insulin aspart  0-5 Units Subcutaneous QHS  . mupirocin ointment  1 application Nasal BID  . predniSONE  5 mg Oral QAC breakfast  . sodium chloride  3 mL Intravenous Q12H   Continuous Infusions:   Principal Problem:   Septic arthritis of hip Active Problems:   Diabetes mellitus type 2 in obese   Left hip pain   Chronic steroid use     DENNIN, SARA A PA-S  [facility] Pager [phone] If 7PM-7AM, please contact night-coverage at [email or web address], password  [facility] 01/22/2013,  9:55 AM  LOS: 6 days     Addendum  Patient seen and examined, chart and data base reviewed.  I agree with the above assessment and plan.  For full details please see Mrs. DENNIN, SARA A PA-S note.   Clint Lipps, MD [facility] Pager: [phone] 01/22/2013, 11:48 AM

## 2013-01-23 ENCOUNTER — Ambulatory Visit (HOSPITAL_COMMUNITY): Payer: BC Managed Care – PPO

## 2013-01-23 DIAGNOSIS — M064 Inflammatory polyarthropathy: Secondary | ICD-10-CM

## 2013-01-23 LAB — GLUCOSE, CAPILLARY
Glucose-Capillary: 185 mg/dL — ABNORMAL HIGH (ref 70–99)
Glucose-Capillary: 105 mg/dL — ABNORMAL HIGH (ref 70–99)
Glucose-Capillary: 125 mg/dL — ABNORMAL HIGH (ref 70–99)

## 2013-01-23 LAB — CBC
HCT: 35 % — ABNORMAL LOW (ref 39.0–52.0)
Hemoglobin: 11.3 g/dL — ABNORMAL LOW (ref 13.0–17.0)
MCH: 26.7 pg (ref 26.0–34.0)
RBC: 4.23 MIL/uL (ref 4.22–5.81)
RDW: 16.8 % — ABNORMAL HIGH (ref 11.5–15.5)
WBC: 7.9 10*3/uL (ref 4.0–10.5)

## 2013-01-23 LAB — BASIC METABOLIC PANEL
BUN: 19 mg/dL (ref 6–23)
CO2: 25 mEq/L (ref 19–32)
Chloride: 98 mEq/L (ref 96–112)
GFR calc non Af Amer: >90 mL/min (ref >90)
Glucose, Bld: 113 mg/dL — ABNORMAL HIGH (ref 70–99)
Potassium: 4.2 mEq/L (ref 3.5–5.1)
Sodium: 135 mEq/L (ref 135–145)

## 2013-01-23 LAB — PROTEIN ELECTROPHORESIS, SERUM
Alpha-1-Globulin: 7.9 % — ABNORMAL HIGH (ref 2.9–4.9)
Alpha-2-Globulin: 15.3 % — ABNORMAL HIGH (ref 7.1–11.8)
Beta Globulin: 12 % — ABNORMAL HIGH (ref 4.7–7.2)
Gamma Globulin: 19.4 % — ABNORMAL HIGH (ref 11.1–18.8)

## 2013-01-23 NOTE — Progress Notes (Signed)
INFECTIOUS DISEASE PROGRESS NOTE  ID: Luke [name] is a 52 y.o. male with  Principal Problem:   Septic arthritis of hip Active Problems:   Diabetes mellitus type 2 in obese   Left hip pain   Chronic steroid use  Subjective: Continued hip pain  Abtx:  Anti-infectives   None      Medications:  Scheduled: . feeding supplement  237 mL Oral BID BM  . insulin aspart  0-15 Units Subcutaneous TID WC  . insulin aspart  0-5 Units Subcutaneous QHS  . predniSONE  5 mg Oral QAC breakfast  . sodium chloride  3 mL Intravenous Q12H    Objective: Vital signs in last 24 hours: Temp:  [98.3 F (36.8 C)-99.7 F (37.6 C)] 98.3 F (36.8 C) (09/23 0542) Pulse Rate:  [83-98] 83 (09/23 0542) Resp:  [18] 18 (09/23 0542) BP: (108-115)/(71-75) 113/72 mmHg (09/23 0542) SpO2:  [94 %-98 %] 94 % (09/23 0542)   General appearance: alert, cooperative, mild distress and up with PT  Lab Results  Recent Labs  01/22/13 0558 01/23/13 0410  WBC 10.8* 7.9  HGB 11.6* 11.3*  HCT 35.2* 35.0*  NA 133* 135  K 3.8 4.2  CL 97 98  CO2 23 25  BUN 20 19  CREATININE 0.85 0.66   Liver Panel No results found for this basename: PROT, ALBUMIN, AST, ALT, ALKPHOS, BILITOT, BILIDIR, IBILI,  in the last 72 hours Sedimentation Rate No results found for this basename: ESRSEDRATE,  in the last 72 hours C-Reactive Protein No results found for this basename: CRP,  in the last 72 hours  Microbiology: Recent Results (from the past 240 hour(s))  CULTURE, BLOOD (ROUTINE X 2)     Status: None   Collection Time    01/17/13  2:37 AM      Result Value Range Status   Specimen Description BLOOD RIGHT HAND   Final   Special Requests     Final   Value: BOTTLES DRAWN AEROBIC AND ANAEROBIC AEB=8CC ANA=5CC   Culture NO GROWTH 5 DAYS   Final   Report Status 01/22/2013 FINAL   Final  CULTURE, BLOOD (ROUTINE X 2)     Status: None   Collection Time    01/17/13  2:37 AM      Result Value Range Status   Specimen  Description BLOOD RIGHT HAND   Final   Special Requests     Final   Value: BOTTLES DRAWN AEROBIC AND ANAEROBIC AEB=7CC ANA=5CC   Culture NO GROWTH 5 DAYS   Final   Report Status 01/22/2013 FINAL   Final  MRSA PCR SCREENING     Status: Abnormal   Collection Time    01/17/13 12:20 PM      Result Value Range Status   MRSA by PCR POSITIVE (*) NEGATIVE Final   Comment:            The GeneXpert MRSA Assay (FDA     approved for NASAL specimens     only), is one component of a     comprehensive MRSA colonization     surveillance program. It is not     intended to diagnose MRSA     infection nor to guide or     monitor treatment for     MRSA infections.     RESULT CALLED TO, READ BACK BY AND VERIFIED WITH:     KNIGHT,C. AT 1618 ON 01/17/2013 BY BAUGHAM,M.  BODY FLUID CULTURE  Status: None   Collection Time    01/20/13  8:56 AM      Result Value Range Status   Specimen Description SYNOVIAL RIGHT HIP FLUID   Final   Special Requests NONE   Final   Gram Stain     Final   Value: WBC PRESENT, PREDOMINANTLY PMN     NO ORGANISMS SEEN     Performed at [facility]   Culture     Final   Value: NO GROWTH 2 DAYS     Performed at [facility]   Report Status PENDING   Incomplete    Studies/Results: No results found.   Assessment/Plan: L hip pain   (septic vs charcot joint) Total days of antibiotics: 0       His Cx is ngtd.  Could consider watchful waiting off anbx (with repeat scan in 2-3 weeks) or empiric anbx now. His MRI is suggestive of infxn but his fluid has not grown bacteria.  Final rec when pt's Cx is final (-).    Luke [name] Infectious Diseases (pager) [phone] [email or web address] 01/23/2013, 12:00 PM  LOS: 7 days

## 2013-01-23 NOTE — Progress Notes (Signed)
PATIENT ID: Luke [name]        Subjective: hip feeling a little bit better today.  He is sitting in a chair more comfortably.  Objective:  Filed Vitals:   01/23/13 0542  BP: 113/72  Pulse: 83  Temp: 98.3 F (36.8 C)  Resp: 18     Alert and oriented.  In no acute distress.  Seated in a chair.  Pain with attempted hip range of motion.  Labs:   Recent Labs  01/21/13 0536 01/22/13 0558 01/23/13 0410  HGB 11.3* 11.6* 11.3*   Recent Labs  01/22/13 0558 01/23/13 0410  WBC 10.8* 7.9  RBC 4.31 4.23  HCT 35.2* 35.0*  PLT 358 392   Recent Labs  01/22/13 0558 01/23/13 0410  NA 133* 135  K 3.8 4.2  CL 97 98  CO2 23 25  BUN 20 19  CREATININE 0.85 0.66  GLUCOSE 132* 113*  CALCIUM 9.6 9.6    Assessment and Plan:left hip destructive arthropathy I spoke with Dr. Ninetta Lights with infectious disease regarding this patient.  At this point he has not had any clinical or laboratory findings consistent with infection.  I think if his final cultures are negative then this can be treated as a nonseptic destructive arthropathy with observation off of antibiotics.  Okay for weightbearing as tolerated with a walker.  If final cultures are negative, he could be discharged and will follow up with infectious disease for repeat evaluation and can follow up with me as well in about 3 weeks for repeat x-rays.  This is likely to take several months to calm down.

## 2013-01-23 NOTE — Progress Notes (Signed)
[facility] PROGRESS NOTE  Luke [name] ZOX:[id number] DOB: 1960/11/26 DOA: 01/16/2013 PCP: Donnetta Hail, MD  HPI/Subjective:  Continued aching pain in L hip. Patient able to sleep better and plans to sit in chair today. Orthopedics and ID please advise if patient does not surgery or antibiotics, if he can be discharged.   Assessment/Plan:  Septic Arthritis of the L Hip  With radiological evidence of osteomyelitis, left hip myositis and abscess  Increasing L sided hip and lower extremity pain and weakness  Pelvic and L Femur x-ray: findings of septic arthritis of L Hip joint  MR of L Hip: infectious myositis small abscesses, osteomyelitis  Synovial fluid cell count is 6574, Gram stain showed no bacteria, cultures pending  ID is waiting for culture to start antibiotics, orthopedics is questioning infection due to increasing sed rate (100 up from 71) and CRP (8.4 up from 2.5)  There is some evidence, that patients on chronic steroids tends to have low CRP with infections (study done on COPD patients)  ID advises to continue to hold antibiotics and follow-up in outpatient clinic Orthopedic surgery is thinking about surgery but now confirmed schedule. Orthopedics and ID please advise if patient does not surgery or antibiotics, if he can be discharged.   Diabetes Mellitus, Type II  Managed with Metformin  Insulin sliding scale: Moderate  Carb modified diet   Chronic Steroid Use  Arthritis pain  Continue with Prednisone  Code Status: FULL Family Communication: None Disposition Plan: Inpatient at this time, SNF with PT/OT eval upon discharge  Consultants:  Infectious Disease  Orthopedics  PT evaluation, pending  OT evaluation, pending  Procedures:  L Hip Aspiration- 01/20/13  Antibiotics:  None   Objective: Filed Vitals:   01/23/13 0542  BP: 113/72  Pulse: 83  Temp: 98.3 F (36.8 C)  Resp: 18    Intake/Output Summary (Last 24 hours) at 01/23/13  1103 Last data filed at 01/23/13 0838  Gross per 24 hour  Intake    243 ml  Output   1350 ml  Net  -1107 ml   Filed Weights   01/16/13 1829 01/17/13 1848 01/17/13 2327  Weight: 102.059 kg (225 lb) 102.694 kg (226 lb 6.4 oz) 103.057 kg (227 lb 3.2 oz)    Exam:   General: Well-developed, obese African-American male, lying in bed in no acute distress   Cardiovascular: RRR, no gallops, rubs, or murmurs   Respiratory: CTA bilaterally on anterior exam, patient not able to sit up to listen posteriorly due to pain   Abdomen: soft, obese abdomen, non-tender   Musculoskeletal: no edema, tenderness to palpation of the lateral left hip   Data Reviewed: Basic Metabolic Panel:  Recent Labs Lab 01/19/13 0545 01/20/13 0420 01/21/13 0536 01/22/13 0558 01/23/13 0410  NA 141 137 133* 133* 135  K 3.8 4.0 4.0 3.8 4.2  CL 105 101 99 97 98  CO2 27 25 25 23 25   GLUCOSE 126* 130* 113* 132* 113*  BUN 6 10 9 20 19   CREATININE 0.47* 0.55 0.50 0.85 0.66  CALCIUM 9.4 9.0 9.4 9.6 9.6   Liver Function Tests:  Recent Labs Lab 01/16/13 1508 01/17/13 0234  AST 8 9  ALT 5 <5  ALKPHOS 75 73  BILITOT 0.2* 0.3  PROT 8.3 7.5  ALBUMIN 2.9* 2.6*   CBC:  Recent Labs Lab 01/16/13 1508 01/16/13 2108  01/19/13 0545 01/20/13 0420 01/21/13 0536 01/22/13 0558 01/23/13 0410  WBC 11.7* 14.3*  < > 8.1 9.3 7.8 10.8*  7.9  NEUTROABS 8.6* 11.4*  --   --   --   --   --   --   HGB 11.4* 11.6*  < > 10.7* 11.3* 11.3* 11.6* 11.3*  HCT 36.3* 36.3*  < > 33.3* 35.1* 35.5* 35.2* 35.0*  MCV 84.2 83.8  < > 83.5 83.4 83.3 81.7 82.7  PLT 380 350  < > 331 360 339 358 392  < > = values in this interval not displayed. Cardiac Enzymes:  Recent Labs Lab 01/17/13 0203  CKTOTAL 52  CKMB 2.0   CBG:  Recent Labs Lab 01/22/13 0756 01/22/13 1138 01/22/13 1641 01/22/13 2113 01/23/13 0759  GLUCAP 123* 126* 144* 87 109*    Recent Results (from the past 240 hour(s))  CULTURE, BLOOD (ROUTINE X 2)      Status: None   Collection Time    01/17/13  2:37 AM      Result Value Range Status   Specimen Description BLOOD RIGHT HAND   Final   Special Requests     Final   Value: BOTTLES DRAWN AEROBIC AND ANAEROBIC AEB=8CC ANA=5CC   Culture NO GROWTH 5 DAYS   Final   Report Status 01/22/2013 FINAL   Final  CULTURE, BLOOD (ROUTINE X 2)     Status: None   Collection Time    01/17/13  2:37 AM      Result Value Range Status   Specimen Description BLOOD RIGHT HAND   Final   Special Requests     Final   Value: BOTTLES DRAWN AEROBIC AND ANAEROBIC AEB=7CC ANA=5CC   Culture NO GROWTH 5 DAYS   Final   Report Status 01/22/2013 FINAL   Final  MRSA PCR SCREENING     Status: Abnormal   Collection Time    01/17/13 12:20 PM      Result Value Range Status   MRSA by PCR POSITIVE (*) NEGATIVE Final   Comment:            The GeneXpert MRSA Assay (FDA     approved for NASAL specimens     only), is one component of a     comprehensive MRSA colonization     surveillance program. It is not     intended to diagnose MRSA     infection nor to guide or     monitor treatment for     MRSA infections.     RESULT CALLED TO, READ BACK BY AND VERIFIED WITH:     KNIGHT,C. AT 1618 ON 01/17/2013 BY BAUGHAM,M.  BODY FLUID CULTURE     Status: None   Collection Time    01/20/13  8:56 AM      Result Value Range Status   Specimen Description SYNOVIAL RIGHT HIP FLUID   Final   Special Requests NONE   Final   Gram Stain     Final   Value: WBC PRESENT, PREDOMINANTLY PMN     NO ORGANISMS SEEN     Performed at [facility]   Culture     Final   Value: NO GROWTH 2 DAYS     Performed at [facility]   Report Status PENDING   Incomplete     Scheduled Meds: . feeding supplement  237 mL Oral BID BM  . insulin aspart  0-15 Units Subcutaneous TID WC  . insulin aspart  0-5 Units Subcutaneous QHS  . predniSONE  5 mg Oral QAC breakfast  . sodium chloride  3 mL Intravenous Q12H  Continuous Infusions:    Principal Problem:   Septic arthritis of hip Active Problems:   Diabetes mellitus type 2 in obese   Left hip pain   Chronic steroid use   DENNIN, SARA A PA-S  [facility] If 7PM-7AM, please contact night-coverage at [email or web address], password [facility] 01/23/2013, 11:03 AM  LOS: 7 days   Addendum  Patient seen and examined, chart and data base reviewed.  I agree with the above assessment and plan.  For full details please see Mrs. DENNIN, SARA A PA-S note.  The above note is reviewed and addended by me.   Clint Lipps, MD [facility] Pager: [phone] 01/23/2013, 12:35 PM

## 2013-01-23 NOTE — Evaluation (Signed)
Physical Therapy Evaluation Patient Details Name: Luke [name] MRN: [id number] DOB: 12-08-1960 Today's Date: 01/23/2013 Time: 4098-1191 PT Time Calculation (min): 24 min  PT Assessment / Plan / Recommendation History of Present Illness  Pt adm with lt hip pain. Pt with septic arthritis vs charcot.    Clinical Impression  Pt admitted with above. Pt currently with functional limitations due to the deficits listed below (see PT Problem List). Pt very motivated to improve mobility. Pt will benefit from skilled PT to increase their independence and safety with mobility to allow discharge to the venue listed below.       PT Assessment  Patient needs continued PT services    Follow Up Recommendations  SNF    Does the patient have the potential to tolerate intense rehabilitation      Barriers to Discharge        Equipment Recommendations  None recommended by PT    Recommendations for Other Services     Frequency Min 3X/week    Precautions / Restrictions Precautions Precautions: Fall Restrictions LLE Weight Bearing: Weight bearing as tolerated   Pertinent Vitals/Pain Severe lt hip pain especially with bed mobility.  Pain improved with standing and then sitting in chair.      Mobility  Bed Mobility Bed Mobility: Supine to Sit;Sitting - Scoot to Edge of Bed Supine to Sit: 1: +2 Total assist Supine to Sit: Patient Percentage: 70% Sitting - Scoot to Edge of Bed: 1: +2 Total assist Details for Bed Mobility Assistance: Pt needs incr time to move himself as much as possible to help control pain.  Pt uses support behind back to push into to slide his hips forward to EOB. Transfers Transfers: Sit to Stand;Stand to Sit;Stand Pivot Transfers Sit to Stand: 1: +2 Total assist;With upper extremity assist;From bed Sit to Stand: Patient Percentage: 80% Stand to Sit: 4: Min assist;With upper extremity assist;With armrests;To chair/3-in-1 Stand Pivot Transfers: 4: Min assist Details for  Transfer Assistance: Pt pivots to chair using rolling walker and by sliding feet little by little.  Doesn't actually take steps.    Exercises     PT Diagnosis: Difficulty walking;Acute pain;Generalized weakness  PT Problem List: Decreased strength;Decreased range of motion;Decreased activity tolerance;Decreased balance;Decreased mobility;Pain PT Treatment Interventions: DME instruction;Gait training;Functional mobility training;Therapeutic activities;Therapeutic exercise;Balance training;Patient/family education     PT Goals(Current goals can be found in the care plan section) Acute Rehab PT Goals Patient Stated Goal: Get to where he can do things for himself. PT Goal Formulation: With patient Time For Goal Achievement: 01/30/13 Potential to Achieve Goals: Fair  Visit Information  Last PT Received On: 01/23/13 Assistance Needed: +2 History of Present Illness: Pt adm with lt hip pain. Pt with septic arthritis vs charcot.         Prior Functioning  Home Living Family/patient expects to be discharged to:: Skilled nursing facility Living Arrangements: Other relatives Available Help at Discharge: Available PRN/intermittently;Family Type of Home: House Home Layout: One level Home Equipment: Walker - 2 wheels Additional Comments: Pt has been living with sister.  Was at SNF several months ago. Prior Function Level of Independence: Needs assistance Gait / Transfers Assistance Needed: Has not been amb since early June due to hip pain. Communication Communication: No difficulties Dominant Hand: Right    Cognition  Cognition Arousal/Alertness: Awake/alert Behavior During Therapy: WFL for tasks assessed/performed Overall Cognitive Status: Within Functional Limits for tasks assessed    Extremity/Trunk Assessment Upper Extremity Assessment Upper Extremity Assessment: Defer to OT  evaluation Lower Extremity Assessment Lower Extremity Assessment: RLE deficits/detail;LLE  deficits/detail RLE Deficits / Details: grossly 3/5 LLE Deficits / Details: Needs assist to move in any plane and very limited motion due to pain. LLE: Unable to fully assess due to pain   Balance Balance Balance Assessed: Yes Static Standing Balance Static Standing - Balance Support: Bilateral upper extremity supported Static Standing - Level of Assistance: 4: Min assist  End of Session PT - End of Session Activity Tolerance: Patient limited by pain Patient left: in chair;with call bell/phone within reach Nurse Communication: Mobility status  GP     [facility] 01/23/2013, 2:45 PM  [facility] PT [phone]

## 2013-01-24 LAB — GLUCOSE, CAPILLARY
Glucose-Capillary: 128 mg/dL — ABNORMAL HIGH (ref 70–99)
Glucose-Capillary: 101 mg/dL — ABNORMAL HIGH (ref 70–99)
Glucose-Capillary: 137 mg/dL — ABNORMAL HIGH (ref 70–99)

## 2013-01-24 LAB — BODY FLUID CULTURE: Culture: NO GROWTH

## 2013-01-24 LAB — CBC
Hemoglobin: 12 g/dL — ABNORMAL LOW (ref 13.0–17.0)
MCH: 26.8 pg (ref 26.0–34.0)
MCHC: 32.7 g/dL (ref 30.0–36.0)
MCV: 82.1 fL (ref 78.0–100.0)
Platelets: 423 10*3/uL — ABNORMAL HIGH (ref 150–400)
RDW: 16.4 % — ABNORMAL HIGH (ref 11.5–15.5)
WBC: 11.9 10*3/uL — ABNORMAL HIGH (ref 4.0–10.5)

## 2013-01-24 MED ORDER — INSULIN ASPART 100 UNIT/ML ~~LOC~~ SOLN: 0.0000 [IU] | Freq: Three times a day (TID) | SUBCUTANEOUS | Status: DC

## 2013-01-24 MED ORDER — SENNOSIDES-DOCUSATE SODIUM 8.6-50 MG PO TABS
2.0000 | ORAL_TABLET | Freq: Every day | ORAL | Status: DC
  Filled 2013-01-24 (×2): qty 2

## 2013-01-24 MED ORDER — POLYETHYLENE GLYCOL 3350 17 G PO PACK
17.0000 g | PACK | Freq: Every day | ORAL | Status: DC | PRN
  Filled 2013-01-24: qty 1

## 2013-01-24 MED ORDER — HYDROCODONE-ACETAMINOPHEN 5-325 MG PO TABS: 1.0000 | ORAL_TABLET | Freq: Four times a day (QID) | ORAL | Status: DC | PRN

## 2013-01-24 MED ORDER — GLUCERNA SHAKE PO LIQD: 237.0000 mL | Freq: Two times a day (BID) | ORAL | Status: DC

## 2013-01-24 MED ORDER — POLYETHYLENE GLYCOL 3350 17 G PO PACK: 17.0000 g | PACK | Freq: Every day | ORAL | Status: DC

## 2013-01-24 NOTE — Evaluation (Signed)
Occupational Therapy Evaluation Patient Details Name: Luke [name] MRN: [id number] DOB: 1961/02/07 Today's Date: 01/24/2013 Time: 6962-9528 OT Time Calculation (min): 36 min  OT Assessment / Plan / Recommendation History of present illness Pt initially adm w/ left hip pain & ? septic arthritis vs charcot. Findings were not consistent w/ infection & pt was dx: left hip destructive arthropathy.   Clinical Impression   Pt presents w/ significant deficits in ADL's and self care due to pain left hip (refer to OT problem list below). He currently requires assistance w/ all aspects of ADL's, functional mobility and self care and will benefit from acute OT followed by SNF rehab.     OT Assessment  Patient needs continued OT Services    Follow Up Recommendations  SNF    Barriers to Discharge      Equipment Recommendations  None recommended by OT    Recommendations for Other Services    Frequency  Min 2X/week    Precautions / Restrictions Precautions Precautions: Fall Restrictions RLE Weight Bearing: Weight bearing as tolerated LLE Weight Bearing: Weight bearing as tolerated   Pertinent Vitals/Pain Pt initially c/o abdominal pain "I need to go to the bathroom, I haven't been in days" Pt did not rate and was noted to be w/o c/o after BM today.    ADL  Eating/Feeding: Simulated;Modified independent Where Assessed - Eating/Feeding: Chair Grooming: Performed;Wash/dry hands;Wash/dry face;Set up Where Assessed - Grooming: Supported sitting Upper Body Bathing: Simulated;Set up Where Assessed - Upper Body Bathing: Supported sitting Lower Body Bathing: Performed;+1 Total assistance Where Assessed - Lower Body Bathing: Supported sit to stand Upper Body Dressing: Simulated;Set up Where Assessed - Upper Body Dressing: Supported sitting Lower Body Dressing: Simulated;Maximal assistance Where Assessed - Lower Body Dressing: Supported sit to stand Toilet Transfer: Performed;Minimal  Dentist Method: Surveyor, minerals: Materials engineer and Hygiene: Performed;Maximal assistance;+1 Total assistance Where Assessed - Engineer, mining and Hygiene: Sit to stand from 3-in-1 or toilet Tub/Shower Transfer Method: Not assessed Equipment Used: Rolling walker Transfers/Ambulation Related to ADLs: Pt was Min-mod assist for SPT from chair to 3:1 today. Pt states that he "slept in the chair last night" ADL Comments: Pt was educated in role of OT. He states that he has DME & long handled a/e at home. After toileting/bm, pt was max-total asssist for peri care "I can't do it, I just can't do it" Pt reports that he has toilet aid at home for increased independence.     OT Diagnosis: Generalized weakness;Acute pain  OT Problem List: Decreased activity tolerance;Pain OT Treatment Interventions: Self-care/ADL training;Therapeutic exercise;Energy conservation;DME and/or AE instruction;Patient/family education;Therapeutic activities   OT Goals(Current goals can be found in the care plan section) Acute Rehab OT Goals Patient Stated Goal: I want to do things for myself Time For Goal Achievement: 01/24/13 Potential to Achieve Goals: Good  Visit Information  Last OT Received On: 01/24/13 Assistance Needed: +2 History of Present Illness: Pt initially adm w/ left hip pain & ? septic arthritis vs charcot. Findings were not consistent w/ infection & pt was dx: left hip destructive arthropathy.       Prior Functioning     Home Living Family/patient expects to be discharged to:: Skilled nursing facility Living Arrangements: Other relatives Available Help at Discharge: Available PRN/intermittently;Family Type of Home: House Home Layout: One level Home Equipment: Walker - 2 wheels Additional Comments: Pt has been living with sister.  Was at SNF several months ago.  Prior Function Level of Independence: Needs  assistance Gait / Transfers Assistance Needed: Has not been amb since early June due to hip pain. ADL's / Homemaking Assistance Needed: Assistance for bathing & dressing & homemaking/cooking. Comments: was indep. until this past sunday Communication Communication: No difficulties Dominant Hand: Right    Vision/Perception Vision - History Baseline Vision:  ("I need glasses")   Cognition  Cognition Arousal/Alertness: Awake/alert Behavior During Therapy: WFL for tasks assessed/performed Overall Cognitive Status: Within Functional Limits for tasks assessed    Extremity/Trunk Assessment Upper Extremity Assessment Upper Extremity Assessment: Overall WFL for tasks assessed Lower Extremity Assessment Lower Extremity Assessment: Defer to PT evaluation    Mobility Bed Mobility Bed Mobility: Not assessed (Pt up in chair upon arrival) Transfers Transfers: Sit to Stand;Stand to Sit Sit to Stand: From chair/3-in-1;4: Min assist;3: Mod assist;With upper extremity assist Stand to Sit: 4: Min guard;To chair/3-in-1;With armrests Details for Transfer Assistance: Pt pivots to 3:1 from chair & back  using rolling walker and by sliding feet little by little (Especially LLE).  Doesn't actually take steps.        Balance Balance Balance Assessed: Yes Static Standing Balance Static Standing - Balance Support: Bilateral upper extremity supported Static Standing - Level of Assistance: 5: Stand by assistance   End of Session OT - End of Session Equipment Utilized During Treatment: Rolling walker;Gait belt Activity Tolerance: Patient tolerated treatment well Patient left: in chair;with call bell/phone within reach Nurse Communication: Mobility status;Other (comment) (BM)  GO     Alm Bustard 01/24/2013, 11:34 AM

## 2013-01-24 NOTE — Progress Notes (Signed)
PT Cancellation Note  Patient Details Name: Luke [name] MRN: [id number] DOB: 28-Jan-1961   Cancelled Treatment:    Reason Eval/Treat Not Completed: Pain limiting ability to participate (abd pain)   [name] 01/24/2013, 10:49 AM

## 2013-01-24 NOTE — Discharge Summary (Addendum)
Physician Discharge Summary  Luke [name]:[id number] DOB: Feb 28, 1961 DOA: 01/16/2013  PCP: Donnetta Hail, MD  Admit date: 01/16/2013 Discharge date: 01/25/2013  Time spent: 45 minutes  Recommendations for Outpatient Follow-up:  1. Discharge to Skilled Nursing Facility with PT to improve independence and mobility. 2. Follow-up appointment with PCP in 3 days. Followup with oncologist also in 1 week for history of protein spike and suspicion of gammapathy. 3. Follow-up appointment and x-ray with Dr. Ave Filter at [facility] in 2 weeks. 4. Follow-up appointment with Dr. Ninetta Lights with Infectious Disease in 1 week. 5. Continue Miralax daily as needed for constipation.   Discharge Diagnoses:  Principal Problem:   Septic arthritis of hip Active Problems:   Diabetes mellitus type 2 in obese   Left hip pain   Chronic steroid use   Discharge Condition: Stable  Diet recommendation: Full, carb modified for diabetes control   Filed Weights   01/16/13 1829 01/17/13 1848 01/17/13 2327  Weight: 102.059 kg (225 lb) 102.694 kg (226 lb 6.4 oz) 103.057 kg (227 lb 3.2 oz)    History of present illness at admission:   Luke [name] is a 52 y.o. male with a past medical history of recently diagnosed diabetes (June 2014), gout ( for which he is not taking any medications), and arthritis. He has had increasing pain in the left hip for several months but it increased dramatically last evening (01/15/13). He was sent to the cancer clinic for evaluation of a protein spike with a concern of gammopathy. Multiple x-rays were ordered by the oncologist which required manipulation of his left hip in order to obtain the films. The hip manipulation dramatically increased the pain. Patient has not been able to bear weight on that left leg. The pain is quite intense and 10 out of 10 in intensity, especially with movement. Denies any recent falls or injuries. He's lost about 100 pounds since February with a  little change in diet. He was placed on steroids for the hip pain couple of months ago, and continues to be on low dose steroid. He reports that he used to be on insulin for diabetes, but is only on oral medications now.   Hospital Course:   Septic Arthritis of the L Hip  With radiological evidence of osteomyelitis, left hip myositis and abscess  Increasing L sided hip and lower extremity pain and weakness  Pelvic and L Femur x-ray: findings of septic arthritis of L Hip joint  MR of L Hip: infectious myositis small abscesses, osteomyelitis  Synovial fluid cell count is 6574, Gram stain showed no bacteria, cultures pending negative to date ID is waiting for culture to start antibiotics, orthopedics is questioning infection due to increasing sed rate (100 up from 71) and CRP (8.4 up from 2.5)  There is some evidence, that patients on chronic steroids tends to have low CRP with infections (study done on COPD patients)  Aspiration cultures continue to remain negative, patient hemodynamically stable and afebrile ID advises to continue to hold antibiotics and follow-up in outpatient clinic with ID and orthopedics both. Orthopedics would like to treat outpatient as nonseptic destructive arthropathy with negative cultures, surgery not necessary  Diabetes Mellitus, Type II  Managed with Metformin  Insulin sliding scale: Do Accu-Cheks q. a.c. at bedtime and adjust dose as needed Carb modified diet  Resume Metformin upon discharge   Chronic Steroid Use  Arthritis pain  Continue with Prednisone   For his H/O of protein spike with a concern of  gammopathy - which was diagnosed in the outpatient setting we will request he continue following with his PCP and recommended oncologist within 3-4 days.    Procedures:  L Hip Aspiration, 01/20/13  Consultations:  Infectious Disease  Orthopedics  Physical Therapy  Discharge Exam: Filed Vitals:   01/25/13 0549  BP: 127/66  Pulse: 91  Temp:  98.2 F (36.8 C)  Resp: 18    General: Well-developed, obese African-American male, in no acute distress Cardiovascular: RRR, no gallops, rubs, or murmurs Respiratory: CTA bilaterally on posterior and anterior exam, no rales, rhonchi, or wheezes Abdomen: mildly firm in the epigastrum, obese, slight tenderness in the epigastric area Musculoskeletal: no edema, tenderness to palpation of the proximal left lower extremity, limited left knee extension, normal left knee flexion, limited exam of hip motion due to pain Skin: dry, warm, increased dead skin on dorsal foot bilaterally     Discharge Instructions  Follow with Primary MD Donnetta Hail, MD in 3 days   Get CBC, CMP, checked 3 days by Primary MD and again as instructed by your Primary MD.   Get Medicines reviewed and adjusted.  Please request your Prim.MD to go over all Hospital Tests and Procedure/Radiological results at the follow up, please get all Hospital records sent to your Prim MD by signing hospital release before you go home.  Activity: As tolerated with Full fall precautions use walker/cane & assistance as needed   Diet:  Heart Healthy - low carbohydrate  Accuchecks 4 times/day, Once in AM empty stomach and then before each meal.    For Heart failure patients - Check your Weight same time everyday, if you gain over 2 pounds, or you develop in leg swelling, experience more shortness of breath or chest pain, call your Primary MD immediately. Follow Cardiac Low Salt Diet and 1.8 lit/day fluid restriction.  Disposition SNF  If you experience worsening of your admission symptoms, develop shortness of breath, life threatening emergency, suicidal or homicidal thoughts you must seek medical attention immediately by calling 911 or calling your MD immediately  if symptoms less severe.  You Must read complete instructions/literature along with all the possible adverse reactions/side effects for all the Medicines you take and  that have been prescribed to you. Take any new Medicines after you have completely understood and accpet all the possible adverse reactions/side effects.   Do not drive and provide baby sitting services if your were admitted for syncope or siezures until you have seen by Primary MD or a Neurologist and advised to do so again.  Do not drive when taking Pain medications.    Do not take more than prescribed Pain, Sleep and Anxiety Medications  Special Instructions: If you have smoked or chewed Tobacco  in the last 2 yrs please stop smoking, stop any regular Alcohol  and or any Recreational drug use.  Wear Seat belts while driving.   Please note  You were cared for by a hospitalist during your hospital stay. If you have any questions about your discharge medications or the care you received while you were in the hospital after you are discharged, you can call the unit and asked to speak with the hospitalist on call if the hospitalist that took care of you is not available. Once you are discharged, your primary care physician will handle any further medical issues. Please note that NO REFILLS for any discharge medications will be authorized once you are discharged, as it is imperative that you return to your primary  care physician (or establish a relationship with a primary care physician if you do not have one) for your aftercare needs so that they can reassess your need for medications and monitor your lab values.            Discharge Orders   Future Orders Complete By Expires   Diet Carb Modified  As directed    Increase activity slowly  As directed        Medication List    STOP taking these medications       acetaminophen 500 MG tablet  Commonly known as:  TYLENOL      TAKE these medications       diclofenac sodium 1 % Gel  Commonly known as:  VOLTAREN  Apply 2 g topically 3 (three) times daily as needed (arthritis pain). For arthritis pain.     diphenhydrAMINE 25 MG  tablet  Commonly known as:  BENADRYL  Take 25-50 mg by mouth daily as needed. For itching and allergy symptoms     feeding supplement Liqd  Take 237 mLs by mouth 2 (two) times daily between meals.     gabapentin 300 MG capsule  Commonly known as:  NEURONTIN  Take 300 mg by mouth at bedtime as needed (for pain).     HYDROcodone-acetaminophen 5-325 MG per tablet  Commonly known as:  NORCO/VICODIN  Take 1 tablet by mouth every 6 (six) hours as needed for pain.     insulin aspart 100 UNIT/ML injection  Commonly known as:  novoLOG  Inject 0-15 Units into the skin 3 (three) times daily with meals. Before each meal 3 times a day, 140-199 - 2 units, 200-250 - 4 units, 251-299 - 6 units,  300-349 - 8 units,  350 or above 10 units.     metFORMIN 500 MG tablet  Commonly known as:  GLUCOPHAGE  Take 500 mg by mouth 2 (two) times daily with a meal.     multivitamin with minerals Tabs tablet  Take 1 tablet by mouth daily.     polyethylene glycol packet  Commonly known as:  MIRALAX / GLYCOLAX  Take 17 g by mouth daily.     predniSONE 5 MG tablet  Commonly known as:  DELTASONE  Take 5 mg by mouth daily.       Allergies  Allergen Reactions  . Bee Venom Swelling  . Shellfish Allergy Nausea And Vomiting   Follow-up Information   Follow up with Donnetta Hail, MD. Schedule an appointment as soon as possible for a visit in 3 days.   Specialty:  Rheumatology   Contact information:   [address]  [place] [address] [phone]       Follow up with Mable Paris, MD. Schedule an appointment as soon as possible for a visit in 2 weeks.   Specialty:  Orthopedic Surgery   Contact information:   [address] [place] [address] [phone]       Follow up with Johny Sax, MD. Schedule an appointment as soon as possible for a visit in 1 week.   Specialty:  Infectious Diseases   Contact information:   [address] [place] [address] [phone]       Follow up with Nona Dell, MD. Schedule an appointment as soon as possible for a visit in 1 week. (follow up on Echo)    Specialty:  Cardiology   Contact information:  [address] [place] [address] [phone]        The results of significant diagnostics from this hospitalization (including imaging, microbiology, ancillary and laboratory) are listed below for reference.    Significant Diagnostic Studies: Dg Pelvis 1-2 Views  01/16/2013   CLINICAL DATA:  Severe left hip and thigh pain.  EXAM: PELVIS - 1-2 VIEW  COMPARISON:  No priors.  FINDINGS: There is complete loss of the joint space in the left hip with very ill-defined detail of the femoral head in the associated acetabulum, with surrounding soft tissue swelling, highly concerning for potential septic arthritis of the left hip. These features are new compared to recent CT scan 10/19/2012. No acute displaced fractures are identified.  IMPRESSION: 1. Findings are concerning for potential septic arthritis of the left hip joint. This could be further evaluated with MRI with and without IV gadolinium of the hip if clinically indicated.  These results were called by telephone at the time of interpretation on 01/16/2013 at 10:53 PMto JOSEPH ZAMMIT , who verbally acknowledged these results.   Electronically Signed   By: Trudie Reed M.D.   On: 01/16/2013 22:56   Dg Femur Left  01/16/2013   CLINICAL DATA:  Severe left hip pain.  EXAM: LEFT FEMUR - 2 VIEW  COMPARISON:  Bone survey 01/16/2013.  FINDINGS: Complete loss of joint space in the left hip with poor definition of the bony trabecula in the left femoral head and adjacent acetabular regions, with surrounding ill-defined soft tissue prominence adjacent to the left hip joint, concerning for potential septic arthritis. The more distal aspects of the left femur are intact, without evidence of acute displaced fracture.   IMPRESSION: 1. Findings concerning for potential septic arthritis in the left hip joint. This could be confirmed with MRI of the left hip with and without IV gadolinium if clinically indicated. These results were called by telephone at the time of interpretation on 01/16/2013 at 10:55 PMto JOSEPH ZAMMIT , who verbally acknowledged these results.   Electronically Signed   By: Trudie Reed M.D.   On: 01/16/2013 22:57   Mr Hip Left W Wo Contrast  01/17/2013   *RADIOLOGY REPORT*  Clinical Data: Severe left hip pain.  MRI OF THE LEFT HIP WITHOUT AND WITH CONTRAST  Technique:  Multiplanar, multisequence MR imaging was performed both before and after administration of intravenous contrast.  Contrast: 20mL MULTIHANCE GADOBENATE DIMEGLUMINE 529 MG/ML IV SOLN  Comparison: Radiographs 01/16/2013.  Findings: Constellation of findings is present compatible with septic arthritis of the left hip.  Erosive changes of the left femoral head and acetabulum are present.  There is severe phlegmon surrounding the superficial and deep aspects of the left hip joint. Myositis of the left iliopsoas muscle and left gluteal muscles is present. Small left there is a left hip effusion and synovitis of the left hip joint.  There is a small elongated fluid collection anterior to the left hip joint tracking along the iliopsoas tendon which may represent septic bursitis or a small abscess.  On axial imaging, this measures 16 mm x 11 mm and the craniocaudal extent is 4.4 cm. Small abscess is present in the left iliacus muscle measuring 6 mm transverse by 3.5 mm AP.  Craniocaudal extent is 2.3 cm.  There is edema and enhancement tracking back along the left psoas muscle but no convincing evidence of vertebral osteomyelitis in the visualized lumbar spine.  There is infectious myositis of the left gluteal muscles, of the left  adductor compartment, and the left obturator internus and externus.  There is a small bowel containing right inguinal  hernia with some fluid in the hernia sac. Incidental visualization of the right hip and right hip girdle musculature appears normal.  IMPRESSION: Constellation of findings compatible with septic arthritis of the left hip with osteomyelitis of the left femoral head and left acetabulum.  Infectious myositis surrounds the left hip and there are small abscesses in the left iliacus muscle and lateral to the left iliopsoas tendon.These results were called by telephone on 01/17/2013 at 1200 hours to Dr. Irene Limbo, who verbally acknowledged these results.   Original Report Authenticated By: Andreas Newport, M.D.   Dg Bone Survey Met  01/16/2013   CLINICAL DATA:  Para proteinemia. Evaluate for possible lytic bone lesions secondary to myeloma. Severe left hip pain.  EXAM: METASTATIC BONE SURVEY  COMPARISON:  Thoracic spine radiographs, 10/2012  FINDINGS: No lytic lesions are seen to suggest multiple myeloma.  Bridging enthesophytes span the thoracolumbar spine. There is fusion of the SI joints with the combination of findings consistent with ankylosing spondylitis. The thoracic spine appearance is stable from the prior exam.  There are marked degenerative changes in the lower cervical spine.  Advanced arthropathic changes of the left hip are reflected by marked joint space narrowing, which is concentric, with marginal osteophytes around the base of the femoral head. The right hip joint is relatively well maintained.  The bones are diffusely demineralized.  There is a portion of a needle within the medial soft tissues, medial to the right distal femoral metaphysis, which is presumed to be chronic.  IMPRESSION: No lytic lesions are seen to suggest multiple myeloma.   Electronically Signed   By: Amie Portland   On: 01/16/2013 16:52   Dg Fluoro Guide Ndl Plc/bx  01/20/2013   CLINICAL DATA:  Left hip joint effusion  EXAM: Left HIP aspiration UNDER FLUOROSCOPY  FLUOROSCOPY TIME:  Dictate in minutes and seconds  PROCEDURE:  Overlying skin prepped with Betadine, draped in the usual sterile fashion, and infiltrated locally with buffered Lidocaine. Curved 22 gauge spinal needle advanced to the superolateral margin of the right/left femoral head. Aspiration yielded 3 cc of serosanguineous fluid.  A single image documents needle placement in the left hip joint.  IMPRESSION: Left hip aspiration.   Electronically Signed   By: Maryclare Bean M.D.   On: 01/20/2013 11:23    Microbiology: Recent Results (from the past 240 hour(s))  CULTURE, BLOOD (ROUTINE X 2)     Status: None   Collection Time    01/17/13  2:37 AM      Result Value Range Status   Specimen Description BLOOD RIGHT HAND   Final   Special Requests     Final   Value: BOTTLES DRAWN AEROBIC AND ANAEROBIC AEB=8CC ANA=5CC   Culture NO GROWTH 5 DAYS   Final   Report Status 01/22/2013 FINAL   Final  CULTURE, BLOOD (ROUTINE X 2)     Status: None   Collection Time    01/17/13  2:37 AM      Result Value Range Status   Specimen Description BLOOD RIGHT HAND   Final   Special Requests     Final   Value: BOTTLES DRAWN AEROBIC AND ANAEROBIC AEB=7CC ANA=5CC   Culture NO GROWTH 5 DAYS   Final   Report Status 01/22/2013 FINAL   Final  MRSA PCR SCREENING     Status: Abnormal   Collection Time  01/17/13 12:20 PM      Result Value Range Status   MRSA by PCR POSITIVE (*) NEGATIVE Final   Comment:            The GeneXpert MRSA Assay (FDA     approved for NASAL specimens     only), is one component of a     comprehensive MRSA colonization     surveillance program. It is not     intended to diagnose MRSA     infection nor to guide or     monitor treatment for     MRSA infections.     RESULT CALLED TO, READ BACK BY AND VERIFIED WITH:     KNIGHT,C. AT 1618 ON 01/17/2013 BY BAUGHAM,M.  BODY FLUID CULTURE     Status: None   Collection Time    01/20/13  8:56 AM      Result Value Range Status   Specimen Description SYNOVIAL RIGHT HIP FLUID   Final   Special Requests NONE    Final   Gram Stain     Final   Value: WBC PRESENT, PREDOMINANTLY PMN     NO ORGANISMS SEEN     Performed at [facility]   Culture     Final   Value: NO GROWTH 3 DAYS     Performed at [facility]   Report Status 01/24/2013 FINAL   Final     Labs: Basic Metabolic Panel:  Recent Labs Lab 01/19/13 0545 01/20/13 0420 01/21/13 0536 01/22/13 0558 01/23/13 0410  NA 141 137 133* 133* 135  K 3.8 4.0 4.0 3.8 4.2  CL 105 101 99 97 98  CO2 27 25 25 23 25   GLUCOSE 126* 130* 113* 132* 113*  BUN 6 10 9 20 19   CREATININE 0.47* 0.55 0.50 0.85 0.66  CALCIUM 9.4 9.0 9.4 9.6 9.6   CBC:  Recent Labs Lab 01/20/13 0420 01/21/13 0536 01/22/13 0558 01/23/13 0410 01/24/13 0435  WBC 9.3 7.8 10.8* 7.9 11.9*  HGB 11.3* 11.3* 11.6* 11.3* 12.0*  HCT 35.1* 35.5* 35.2* 35.0* 36.7*  MCV 83.4 83.3 81.7 82.7 82.1  PLT 360 339 358 392 423*   CBG:  Recent Labs Lab 01/24/13 0809 01/24/13 1211 01/24/13 1714 01/24/13 2154 01/25/13 0741  GLUCAP 128* 157* 101* 137* 125*       Signed:  DENNIN, SARA A PA-S  [facility] 01/25/2013, 9:08 AM

## 2013-01-24 NOTE — Clinical Social Work Psychosocial (Signed)
Clinical Social Work Department BRIEF PSYCHOSOCIAL ASSESSMENT 01/24/2013  Patient:  Luke [name], Luke [name]     Account Number:  [id number]     Admit date:  01/16/2013  Clinical Social Worker:  Lavell Luster  Date/Time:  01/23/2013 01:00 PM  Referred by:  Physician  Date Referred:  01/23/2013 Referred for  SNF Placement   Other Referral:   Interview type:  Patient Other interview type:    PSYCHOSOCIAL DATA Living Status:  FAMILY Admitted from facility:   Level of care:   Primary support name:  Lenord Fellers [phone] Primary support relationship to patient:  SIBLING Degree of support available:   Support is good. Patient has brothers and sisters that will support him.    CURRENT CONCERNS Current Concerns  Post-Acute Placement   Other Concerns:    SOCIAL WORK ASSESSMENT / PLAN CSW met with patient to discuss recommendation for SNF placement. Patient agreeable to SNF and would prefer a SNF in [facility]. Patient has past stay at [facility]. Patient is from home with sister, but plans to get his own place once SNF stay is over. Other family contacts: Cloyd Stagers [phone], Wilma Flavin [phone].   Assessment/plan status:  Psychosocial Support/Ongoing Assessment of Needs Other assessment/ plan:   complete FL2, PASRR, Fax out.   Information/referral to community resources:   SNF list and CSW contact info given to patient.    PATIENT'S/FAMILY'S RESPONSE TO PLAN OF CARE: Patient is very pleasant and appreciative of CSW visit. Patient is agreeable to SNF placement. Patient awaits bed offers.    Roddie Mc, [place], [place], [phone]

## 2013-01-24 NOTE — Clinical Social Work Note (Signed)
RN informed CSW that facility stated that [facility] authorization was not received and that patient could not DC to facility today. [facility] transport was cancelled upon arrival. CSW has contacted facility and admissions coordinator explains that authorization will likely be received tomorrow (01/24/13).Marland KitchenMarland KitchenPatient was understanding of confusion and accepting of CSW's apology. CSW will continue to follow.  Roddie Mc, [place], [place], [phone]

## 2013-01-24 NOTE — Progress Notes (Signed)
INFECTIOUS DISEASE PROGRESS NOTE  ID: Luke [name] is a 52 y.o. male with  Principal Problem:   Septic arthritis of hip Active Problems:   Diabetes mellitus type 2 in obese   Left hip pain   Chronic steroid use  Subjective: Decreased hip pain, bearing wt (partially) Up in chair  Abtx:  Anti-infectives   None      Medications:  Scheduled: . feeding supplement  237 mL Oral BID BM  . insulin aspart  0-15 Units Subcutaneous TID WC  . insulin aspart  0-5 Units Subcutaneous QHS  . predniSONE  5 mg Oral QAC breakfast  . senna-docusate  2 tablet Oral QHS  . sodium chloride  3 mL Intravenous Q12H    Objective: Vital signs in last 24 hours: Temp:  [98.4 F (36.9 C)-98.6 F (37 C)] 98.6 F (37 C) (09/24 1300) Pulse Rate:  [96-117] 96 (09/24 1300) Resp:  [18-20] 20 (09/24 1300) BP: (117-136)/(74-85) 136/74 mmHg (09/24 1300) SpO2:  [97 %-98 %] 98 % (09/24 1300)   General appearance: alert, cooperative and no distress  Lab Results  Recent Labs  01/22/13 0558 01/23/13 0410 01/24/13 0435  WBC 10.8* 7.9 11.9*  HGB 11.6* 11.3* 12.0*  HCT 35.2* 35.0* 36.7*  NA 133* 135  --   K 3.8 4.2  --   CL 97 98  --   CO2 23 25  --   BUN 20 19  --   CREATININE 0.85 0.66  --    Liver Panel No results found for this basename: PROT, ALBUMIN, AST, ALT, ALKPHOS, BILITOT, BILIDIR, IBILI,  in the last 72 hours Sedimentation Rate No results found for this basename: ESRSEDRATE,  in the last 72 hours C-Reactive Protein No results found for this basename: CRP,  in the last 72 hours  Microbiology: Recent Results (from the past 240 hour(s))  CULTURE, BLOOD (ROUTINE X 2)     Status: None   Collection Time    01/17/13  2:37 AM      Result Value Range Status   Specimen Description BLOOD RIGHT HAND   Final   Special Requests     Final   Value: BOTTLES DRAWN AEROBIC AND ANAEROBIC AEB=8CC ANA=5CC   Culture NO GROWTH 5 DAYS   Final   Report Status 01/22/2013 FINAL   Final  CULTURE,  BLOOD (ROUTINE X 2)     Status: None   Collection Time    01/17/13  2:37 AM      Result Value Range Status   Specimen Description BLOOD RIGHT HAND   Final   Special Requests     Final   Value: BOTTLES DRAWN AEROBIC AND ANAEROBIC AEB=7CC ANA=5CC   Culture NO GROWTH 5 DAYS   Final   Report Status 01/22/2013 FINAL   Final  MRSA PCR SCREENING     Status: Abnormal   Collection Time    01/17/13 12:20 PM      Result Value Range Status   MRSA by PCR POSITIVE (*) NEGATIVE Final   Comment:            The GeneXpert MRSA Assay (FDA     approved for NASAL specimens     only), is one component of a     comprehensive MRSA colonization     surveillance program. It is not     intended to diagnose MRSA     infection nor to guide or     monitor treatment for  MRSA infections.     RESULT CALLED TO, READ BACK BY AND VERIFIED WITH:     KNIGHT,C. AT 1618 ON 01/17/2013 BY BAUGHAM,M.  BODY FLUID CULTURE     Status: None   Collection Time    01/20/13  8:56 AM      Result Value Range Status   Specimen Description SYNOVIAL RIGHT HIP FLUID   Final   Special Requests NONE   Final   Gram Stain     Final   Value: WBC PRESENT, PREDOMINANTLY PMN     NO ORGANISMS SEEN     Performed at [facility]   Culture     Final   Value: NO GROWTH 3 DAYS     Performed at [facility]   Report Status 01/24/2013 FINAL   Final    Studies/Results: No results found.   Assessment/Plan: Arthritis of L hip  Septic vs charcot  Improving without anbx Will see him back in ID clinic to f/u (2-3 weeks), monitor his progress.   Total days of antibiotics: 0         Luke [name] Infectious Diseases (pager) [phone] [email or web address] 01/24/2013, 4:08 PM  LOS: 8 days

## 2013-01-24 NOTE — Progress Notes (Signed)
Patient was ready for dc to [facility] health. Attempted to call report to receiving RN but was informed that insurance didn't authorize his admission. SW was informed and the physician was notified.

## 2013-01-25 ENCOUNTER — Encounter (HOSPITAL_COMMUNITY): Payer: Self-pay | Admitting: Oncology

## 2013-01-25 LAB — GLUCOSE, CAPILLARY: Glucose-Capillary: 153 mg/dL — ABNORMAL HIGH (ref 70–99)

## 2013-01-25 NOTE — Progress Notes (Signed)
PATIENT ID: Luke [name]        Subjective:"doing just fine"  Sitting up in chair eating lunch.  Says he has been getting around some with walker.  Able to put some weight on LLE.  Objective:  Filed Vitals:   01/25/13 0549  BP: 127/66  Pulse: 91  Temp: 98.2 F (36.8 C)  Resp: 18     Seated in chair in NAD.   Labs:   Recent Labs  01/23/13 0410 01/24/13 0435  HGB 11.3* 12.0*   Recent Labs  01/23/13 0410 01/24/13 0435  WBC 7.9 11.9*  RBC 4.23 4.47  HCT 35.0* 36.7*  PLT 392 423*   Recent Labs  01/23/13 0410  NA 135  K 4.2  CL 98  CO2 25  BUN 19  CREATININE 0.66  GLUCOSE 113*  CALCIUM 9.6    Assessment and Plan: L hip destructive arthropathy.  Does not appear to be septic. D/c to SNF today.  Cont to observe off abx OK for WBAT with PT on walker F/u wth me 2-3 wks.

## 2013-01-25 NOTE — Progress Notes (Signed)
Luke [name] to be D/C'd Skilled nursing facility per MD order.  Discussed with the patient and all questions fully answered.    Medication List    STOP taking these medications       acetaminophen 500 MG tablet  Commonly known as:  TYLENOL      TAKE these medications       diclofenac sodium 1 % Gel  Commonly known as:  VOLTAREN  Apply 2 g topically 3 (three) times daily as needed (arthritis pain). For arthritis pain.     diphenhydrAMINE 25 MG tablet  Commonly known as:  BENADRYL  Take 25-50 mg by mouth daily as needed. For itching and allergy symptoms     feeding supplement Liqd  Take 237 mLs by mouth 2 (two) times daily between meals.     gabapentin 300 MG capsule  Commonly known as:  NEURONTIN  Take 300 mg by mouth at bedtime as needed (for pain).     HYDROcodone-acetaminophen 5-325 MG per tablet  Commonly known as:  NORCO/VICODIN  Take 1 tablet by mouth every 6 (six) hours as needed for pain.     insulin aspart 100 UNIT/ML injection  Commonly known as:  novoLOG  Inject 0-15 Units into the skin 3 (three) times daily with meals. Before each meal 3 times a day, 140-199 - 2 units, 200-250 - 4 units, 251-299 - 6 units,  300-349 - 8 units,  350 or above 10 units.     metFORMIN 500 MG tablet  Commonly known as:  GLUCOPHAGE  Take 500 mg by mouth 2 (two) times daily with a meal.     multivitamin with minerals Tabs tablet  Take 1 tablet by mouth daily.     polyethylene glycol packet  Commonly known as:  MIRALAX / GLYCOLAX  Take 17 g by mouth daily.     predniSONE 5 MG tablet  Commonly known as:  DELTASONE  Take 5 mg by mouth daily.        VVS, Skin clean, dry and intact without evidence of skin break down, no evidence of skin tears noted. IV catheter discontinued intact. Site without signs and symptoms of complications. Dressing and pressure applied.  An After Visit Summary was printed and given to the transporters for nursing at [facility]. Report given to  attending nurse at [facility].Patient escorted via [facility] on stretcher to [facility]. auto.  Cindra Eves, RN 01/25/2013 3:38 PM

## 2013-01-25 NOTE — Progress Notes (Signed)
NUTRITION FOLLOW UP  Intervention:   1. Continue Glucerna Shake po BID, each supplement provides 220 kcal and 10 grams of protein.   Nutrition Dx:   Increased protein-energy needs related to septic arthritis as evidenced by guidelines for nutrition requirements and chronic inflammatory arthritis  Goal:   Pt to meet >/=90% estimated nutrition needs.   Monitor:   PO intake, weight trends, labs  Assessment:   Pt with hip pain, transferred to [facility] from [facility] for possible surgery. Aspiration of hip did not show any infection. Pt planned for d/c to SNF yesterday but was late in day was noted that did not have insurance authorization and d/c was cancelled. Pt likely for d/c today.   Pt eating fair, 75% most meals. Has Glucerna shakes BID. Recommend continue these at SNF.   Height: Ht Readings from Last 1 Encounters:  01/17/13 5\' 6"  (1.676 m)    Weight Status:   Wt Readings from Last 1 Encounters:  01/17/13 227 lb 3.2 oz (103.057 kg)    Re-estimated needs:  Kcal: 1800-2200  Protein: 100-120 gr  Fluid: >2500 ml/day   Skin: intact  Diet Order: Carb Control   Intake/Output Summary (Last 24 hours) at 01/25/13 1425 Last data filed at 01/25/13 0045  Gross per 24 hour  Intake    320 ml  Output    450 ml  Net   -130 ml    Last BM: 9/24   Labs:   Recent Labs Lab 01/21/13 0536 01/22/13 0558 01/23/13 0410  NA 133* 133* 135  K 4.0 3.8 4.2  CL 99 97 98  CO2 25 23 25   BUN 9 20 19   CREATININE 0.50 0.85 0.66  CALCIUM 9.4 9.6 9.6  GLUCOSE 113* 132* 113*    CBG (last 3)   Recent Labs  01/24/13 2154 01/25/13 0741 01/25/13 1216  GLUCAP 137* 125* 153*    Scheduled Meds: . feeding supplement  237 mL Oral BID BM  . insulin aspart  0-15 Units Subcutaneous TID WC  . insulin aspart  0-5 Units Subcutaneous QHS  . predniSONE  5 mg Oral QAC breakfast  . senna-docusate  2 tablet Oral QHS  . sodium chloride  3 mL Intravenous Q12H    Continuous Infusions:   Isabell Jarvis  RD, LDN Pager [phone] After Hours pager [phone]

## 2013-01-25 NOTE — Progress Notes (Signed)
Physical Therapy Treatment Patient Details Name: Luke [name] MRN: [id number] DOB: 04-23-1961 Today's Date: 01/25/2013 Time: 4098-1191 PT Time Calculation (min): 25 min  PT Assessment / Plan / Recommendation  History of Present Illness Pt initially adm w/ left hip pain & ? septic arthritis vs charcot. Findings were not consistent w/ infection & pt was dx: left hip destructive arthropathy.   PT Comments   Pt c/o significant pain in L hip today and stated he "slept on it wrong".  Needed significant assist to get to EOB but did well with pivot transfer.  Follow Up Recommendations  SNF     Does the patient have the potential to tolerate intense rehabilitation     Barriers to Discharge        Equipment Recommendations  None recommended by PT    Recommendations for Other Services    Frequency Min 3X/week   Progress towards PT Goals Progress towards PT goals: Progressing toward goals  Plan Current plan remains appropriate    Precautions / Restrictions Precautions Precautions: Fall Restrictions Weight Bearing Restrictions: No RLE Weight Bearing: Weight bearing as tolerated LLE Weight Bearing: Weight bearing as tolerated   Pertinent Vitals/Pain 10/10 L hip during mobility-premedicated    Mobility  Bed Mobility Bed Mobility: Supine to Sit Supine to Sit: 1: +2 Total assist;With rails;HOB elevated Supine to Sit: Patient Percentage: 40% Sitting - Scoot to Edge of Bed: 1: +2 Total assist Sitting - Scoot to Edge of Bed: Patient Percentage: 70% Details for Bed Mobility Assistance: Pt needs incr time to move himself as much as possible to help control pain.  Pt uses support behind back to push into to slide his hips forward to EOB. Transfers Transfers: Sit to Stand;Stand to Sit;Stand Pivot Transfers Sit to Stand: From bed;1: +2 Total assist;With upper extremity assist Sit to Stand: Patient Percentage: 70% Stand to Sit: 1: +2 Total assist;With upper extremity assist;With armrests;To  chair/3-in-1 Stand to Sit: Patient Percentage: 80% Stand Pivot Transfers: 4: Min assist Details for Transfer Assistance: Pivots with RW and scoots feet Ambulation/Gait Ambulation/Gait Assistance: Not tested (comment)         PT Goals (current goals can now be found in the care plan section)    Visit Information  Last PT Received On: 01/25/13 Assistance Needed: +2 History of Present Illness: Pt initially adm w/ left hip pain & ? septic arthritis vs charcot. Findings were not consistent w/ infection & pt was dx: left hip destructive arthropathy.    Subjective Data      Cognition  Cognition Arousal/Alertness: Awake/alert Behavior During Therapy: WFL for tasks assessed/performed Overall Cognitive Status: Within Functional Limits for tasks assessed    Balance     End of Session PT - End of Session Equipment Utilized During Treatment: Gait belt Activity Tolerance: Patient limited by pain Patient left: in chair;with call bell/phone within reach Nurse Communication: Mobility status   Newell Coral, [place] [facility] [phone]     Newell Coral 01/25/2013, 10:26 AM

## 2013-02-13 ENCOUNTER — Encounter: Payer: Self-pay | Admitting: Internal Medicine

## 2013-02-13 ENCOUNTER — Ambulatory Visit (INDEPENDENT_AMBULATORY_CARE_PROVIDER_SITE_OTHER): Payer: BC Managed Care – PPO | Admitting: Internal Medicine

## 2013-02-13 VITALS — Temp 98.3°F

## 2013-02-13 DIAGNOSIS — M1990 Unspecified osteoarthritis, unspecified site: Secondary | ICD-10-CM

## 2013-02-13 DIAGNOSIS — M129 Arthropathy, unspecified: Secondary | ICD-10-CM

## 2013-02-13 LAB — SEDIMENTATION RATE: Sed Rate: 81 mm/hr — ABNORMAL HIGH (ref 0–16)

## 2013-02-13 NOTE — Progress Notes (Signed)
[facility] CLINIC NOTE  RFV: hospital follow up for left hip arthritis vs. septic arthritis Subjective:    Patient ID: Luke [name], male    DOB: [birth year], 52 y.o.   MRN: [id number]  HPI Luke [name] is a 52 y.o. male with PMHX of diabetes , gout, and arthritis, also being worked up for protein spike concern for gammopathy/MM. He has had increasing pain in the left hip for several months but it increased dramatically where he was unable to weight bear. He was admitted on 01/15/13). Patient has not been able to bear weight on that left leg due to manipulation of joint for xrays.. The pain is quite intense and 10 out of 10 in intensity, especially with movement. Denies any recent falls or injuries. He's lost about 100 pounds since February with a little change in diet. He was placed on steroids for the hip pain couple of months ago, and continues to be on low dose steroid. He reports that he used to be on insulin for diabetes, but is only on oral medications now. He had arthrocentesis which showed 6,574 with 91% N. No growth, gram stain negative. ID and orthopedics weighed in to treat as destructive arthropathy rather than septic arthritis. He was not placed on antibiotics. He denies any fever, chills, nightsweats, still has some pain with left hip   Current Outpatient Prescriptions on File Prior to Visit  Medication Sig Dispense Refill  . diclofenac sodium (VOLTAREN) 1 % GEL Apply 2 g topically 3 (three) times daily as needed (arthritis pain). For arthritis pain.      . diphenhydrAMINE (BENADRYL) 25 MG tablet Take 25-50 mg by mouth daily as needed. For itching and allergy symptoms      . feeding supplement (GLUCERNA SHAKE) LIQD Take 237 mLs by mouth 2 (two) times daily between meals.    0  . gabapentin (NEURONTIN) 300 MG capsule Take 300 mg by mouth at bedtime as needed (for pain).       Marland Kitchen HYDROcodone-acetaminophen (NORCO/VICODIN) 5-325 MG per tablet Take 1 tablet by mouth every 6 (six) hours as needed  for pain.  30 tablet  0  . insulin aspart (NOVOLOG) 100 UNIT/ML injection Inject 0-15 Units into the skin 3 (three) times daily with meals. Before each meal 3 times a day, 140-199 - 2 units, 200-250 - 4 units, 251-299 - 6 units,  300-349 - 8 units,  350 or above 10 units.  1 vial  12  . metFORMIN (GLUCOPHAGE) 500 MG tablet Take 500 mg by mouth 2 (two) times daily with a meal.      . Multiple Vitamin (MULTIVITAMIN WITH MINERALS) TABS Take 1 tablet by mouth daily.      . polyethylene glycol (MIRALAX / GLYCOLAX) packet Take 17 g by mouth daily.  14 each  0  . predniSONE (DELTASONE) 5 MG tablet Take 5 mg by mouth daily.       No current facility-administered medications on file prior to visit.   Active Ambulatory Problems    Diagnosis Date Noted  . Ulna distal fracture 12/22/2010  . DKA (diabetic ketoacidoses) 10/15/2012  . Diabetes mellitus type 2 in obese 10/15/2012  . DM (diabetes mellitus) type 2, uncontrolled, with ketoacidosis 10/17/2012  . Acute gout 10/17/2012  . Obesity 10/17/2012  . Sinus tachycardia 10/18/2012  . Leg edema, right 10/18/2012  . Unintentional weight loss 10/18/2012  . Hyponatremia 10/18/2012  . Cellulitis of left lower extremity 10/18/2012  . Neck stiffness 10/19/2012  .  Neck pain, bilateral 10/19/2012  . Cellulitis of leg, right 10/19/2012  . Protein-calorie malnutrition, severe 10/20/2012  . Cervical stenosis of spine 10/20/2012  . Right inguinal hernia 10/20/2012  . Cholelithiasis 10/20/2012  . 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 Chronic inflammatory arthritis 10/21/2012  . 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 Left hip pain 01/17/2013  . Chronic steroid use 01/17/2013  . Septic arthritis of hip 01/17/2013   Resolved Ambulatory Problems    Diagnosis Date Noted  . Bilateral arm weakness 10/19/2012  . Shoulder impingement syndrome 10/20/2012   Past Medical History  Diagnosis Date  . DM (diabetes mellitus)   . Gout   . Pain, joint,  multiple sites     History  Substance Use Topics  . Smoking status: Former Smoker -- 1.00 packs/day    Quit date: 10/14/2012  . Smokeless tobacco: Never Used  . Alcohol Use: No     Review of Systems  Constitutional: Negative for fever, chills, diaphoresis, activity change, appetite change, fatigue and unexpected weight change.  HENT: Negative for congestion, sore throat, rhinorrhea, sneezing, trouble swallowing and sinus pressure.  Eyes: Negative for photophobia and visual disturbance.  Respiratory: Negative for cough, chest tightness, shortness of breath, wheezing and stridor.  Cardiovascular: Negative for chest pain, palpitations and leg swelling.  Gastrointestinal: Negative for nausea, vomiting, abdominal pain, diarrhea, constipation, blood in stool, abdominal distention and anal bleeding.  Genitourinary: Negative for dysuria, hematuria, flank pain and difficulty urinating.  Musculoskeletal: Negative for myalgias, back pain, joint swelling, arthralgias and gait problem.  Skin: Negative for color change, pallor, rash and wound.  Neurological: Negative for dizziness, tremors, weakness and light-headedness.  Hematological: Negative for adenopathy. Does not bruise/bleed easily.  Psychiatric/Behavioral: Negative for behavioral problems, confusion, sleep disturbance, dysphoric mood, decreased concentration and agitation.       Objective:   Physical Exam Temp(Src) 98.3 F (36.8 C) (Oral) Physical Exam  Constitutional: He is oriented to person, place, and time. He appears well-developed and well-nourished. No distress.  HENT:  Mouth/Throat: Oropharynx is clear and moist. No oropharyngeal exudate.  Cardiovascular: Normal rate, regular rhythm and normal heart sounds. Exam reveals no gallop and no friction rub.  No murmur heard.  Pulmonary/Chest: Effort normal and breath sounds normal. No respiratory distress. He has no wheezes.  Abdominal: Soft. Bowel sounds are normal. He exhibits  no distension. There is no tenderness.  Lymphadenopathy:  He has no cervical adenopathy.  Neurological: He is alert and oriented to person, place, and time.  Skin: Skin is warm and dry. No rash noted. No erythema.  Psychiatric: He has a normal mood and affect. His behavior is normal.     Lab Results  Component Value Date   ESRSEDRATE 100* 01/20/2013   Lab Results  Component Value Date   CRP 8.4* 01/20/2013         Assessment & Plan:    Destructive arthropathy vs. Septic arthritics- we will re-check crp , and sed rate. It appears both inflammatory markers are trending down despite no antibiotics.  Will have him follow up with orthopedics to see if repeat arthrocentesis is needed nad repeat culture to unsure no septic arthritis.  Lab Results  Component Value Date   ESRSEDRATE 81* 02/13/2013   Lab Results  Component Value Date   CRP 0.7* 02/13/2013

## 2014-03-13 ENCOUNTER — Other Ambulatory Visit: Payer: Self-pay | Admitting: Orthopedic Surgery

## 2014-03-25 ENCOUNTER — Encounter: Payer: Self-pay | Admitting: Cardiology

## 2014-04-10 ENCOUNTER — Other Ambulatory Visit (HOSPITAL_COMMUNITY): Payer: Self-pay

## 2014-04-10 ENCOUNTER — Inpatient Hospital Stay (HOSPITAL_COMMUNITY)
Admission: RE | Admit: 2014-04-10 | Discharge: 2014-04-10 | Disposition: A | Discharge status: Home / Self Care | Payer: BC Managed Care – PPO | Source: Ambulatory Visit

## 2014-04-10 NOTE — Pre-Procedure Instructions (Signed)
Luke [name]  04/10/2014   Your procedure is scheduled on:  April 19, 2014  [facility] at 10:15 AM.  Call this number if you have problems the morning of surgery: [phone]   Remember:   Do not eat food or drink liquids after midnight.   Take these medicines the morning of surgery with A SIP OF WATER: PAIN PILL IF NEEDED, BENADRYL IF NEEDED   STOP ASPIRIN, DICLOFENAC, HERBAL MEDICATIONS AS OF 12/10   Do not wear jewelry.  Do not wear lotions, powders, or colognes. You may wear deodorant.  Men may shave face and neck.  Do not bring valuables to the hospital.  [facility] is not responsible  for any belongings or valuables.               Contacts, dentures or bridgework may not be worn into surgery.  Leave suitcase in the car. After surgery it may be brought to your room.  For patients admitted to the hospital, discharge time is determined by your treatment team.               Patients discharged the day of surgery will not be allowed to drive home.  Name and phone number of your driver: family/friend     Please read over the following fact sheets that you were given: Pain Booklet, Coughing and Deep Breathing, Blood Transfusion Information and Surgical Site Infection Prevention

## 2014-04-11 ENCOUNTER — Encounter (HOSPITAL_COMMUNITY)
Admission: RE | Admit: 2014-04-11 | Discharge: 2014-04-11 | Disposition: A | Discharge status: Home / Self Care | Payer: BC Managed Care – PPO | Source: Ambulatory Visit | Attending: Orthopedic Surgery | Admitting: Orthopedic Surgery

## 2014-04-11 ENCOUNTER — Ambulatory Visit (HOSPITAL_COMMUNITY)
Admission: RE | Admit: 2014-04-11 | Discharge: 2014-04-11 | Disposition: A | Discharge status: Home / Self Care | Payer: BC Managed Care – PPO | Source: Ambulatory Visit | Attending: Orthopedic Surgery | Admitting: Orthopedic Surgery

## 2014-04-11 ENCOUNTER — Encounter (HOSPITAL_COMMUNITY): Payer: Self-pay

## 2014-04-11 ENCOUNTER — Other Ambulatory Visit (HOSPITAL_COMMUNITY): Payer: Self-pay | Admitting: *Deleted

## 2014-04-11 DIAGNOSIS — Z01810 Encounter for preprocedural cardiovascular examination: Secondary | ICD-10-CM | POA: Insufficient documentation

## 2014-04-11 DIAGNOSIS — M25552 Pain in left hip: Secondary | ICD-10-CM | POA: Diagnosis not present

## 2014-04-11 DIAGNOSIS — Z01812 Encounter for preprocedural laboratory examination: Secondary | ICD-10-CM | POA: Insufficient documentation

## 2014-04-11 DIAGNOSIS — Z01818 Encounter for other preprocedural examination: Secondary | ICD-10-CM

## 2014-04-11 HISTORY — DX: Pneumonia, unspecified organism: J18.9

## 2014-04-11 LAB — CBC WITH DIFFERENTIAL/PLATELET
BASOS ABS: 0 10*3/uL (ref 0.0–0.1)
Basophils Relative: 0 % (ref 0–1)
Eosinophils Absolute: 0.1 10*3/uL (ref 0.0–0.7)
Eosinophils Relative: 1 % (ref 0–5)
HCT: 41.2 % (ref 39.0–52.0)
HEMOGLOBIN: 13.5 g/dL (ref 13.0–17.0)
LYMPHS PCT: 27 % (ref 12–46)
Lymphs Abs: 2.3 10*3/uL (ref 0.7–4.0)
MCH: 27.3 pg (ref 26.0–34.0)
MCHC: 32.8 g/dL (ref 30.0–36.0)
MCV: 83.2 fL (ref 78.0–100.0)
MONO ABS: 0.5 10*3/uL (ref 0.1–1.0)
MONOS PCT: 6 % (ref 3–12)
Neutro Abs: 5.5 10*3/uL (ref 1.7–7.7)
Neutrophils Relative %: 66 % (ref 43–77)
Platelets: 285 10*3/uL (ref 150–400)
RBC: 4.95 MIL/uL (ref 4.22–5.81)
RDW: 17.3 % — AB (ref 11.5–15.5)
WBC: 8.4 10*3/uL (ref 4.0–10.5)

## 2014-04-11 LAB — SURGICAL PCR SCREEN
MRSA, PCR: NEGATIVE
STAPHYLOCOCCUS AUREUS: NEGATIVE

## 2014-04-11 LAB — BASIC METABOLIC PANEL
ANION GAP: 13 (ref 5–15)
BUN: 11 mg/dL (ref 6–23)
CHLORIDE: 105 meq/L (ref 96–112)
CO2: 22 meq/L (ref 19–32)
Calcium: 9.1 mg/dL (ref 8.4–10.5)
Creatinine, Ser: 0.64 mg/dL (ref 0.50–1.35)
GFR calc Af Amer: >90 mL/min (ref >90)
GFR calc non Af Amer: >90 mL/min (ref >90)
Glucose, Bld: 102 mg/dL — ABNORMAL HIGH (ref 70–99)
POTASSIUM: 3.9 meq/L (ref 3.7–5.3)
SODIUM: 140 meq/L (ref 137–147)

## 2014-04-11 LAB — TYPE AND SCREEN
ABO/RH(D): A POS
Antibody Screen: NEGATIVE

## 2014-04-11 LAB — ABO/RH: ABO/RH(D): A POS

## 2014-04-11 LAB — APTT: APTT: 34 s (ref 24–37)

## 2014-04-11 LAB — PROTIME-INR
INR: 1.01 (ref 0.00–1.49)
Prothrombin Time: 13.5 seconds (ref 11.6–15.2)

## 2014-04-11 NOTE — Progress Notes (Addendum)
Stop Bang Assessment sent to PCP.  Pt denies any cardiac history. Denies chest pain or sob.

## 2014-04-11 NOTE — Pre-Procedure Instructions (Signed)
Luke [name]  04/11/2014   Your procedure is scheduled on:  Friday, April 19, 2014 at 12:15 PM.   [facility] "[facility]" Admitting Office at 10:15 AM.   Call this number if you have problems the morning of surgery: [phone]                Any questions prior to day of surgery, please call [phone] between 8 & 4 PM.   Remember:   Do not eat food or drink liquids after midnight Thursday, 04/18/14.   Take these medicines the morning of surgery with A SIP OF WATER: Gabapentin if needed  Stop Vitamins as of tomorrow.  Do not take your diabetic medications the day of surgery.   Do not wear jewelry.  Do not wear lotions, powders, or cologne. You may wear deodorant.  Men may shave face and neck.  Do not bring valuables to the hospital.  [facility] is not responsible                  for any belongings or valuables.               Contacts, dentures or bridgework may not be worn into surgery.  Leave suitcase in the car. After surgery it may be brought to your room.  For patients admitted to the hospital, discharge time is determined by your                treatment team.             Special Instructions: [place] - Preparing for Surgery  Before surgery, you can play an important role.  Because skin is not sterile, your skin needs to be as free of germs as possible.  You can reduce the number of germs on you skin by washing with CHG (chlorahexidine gluconate) soap before surgery.  CHG is an antiseptic cleaner which kills germs and bonds with the skin to continue killing germs even after washing.  Please DO NOT use if you have an allergy to CHG or antibacterial soaps.  If your skin becomes reddened/irritated stop using the CHG and inform your nurse when you arrive at Short Stay.  Do not shave (including legs and underarms) for at least 48 hours prior to the first CHG shower.  You may shave your face.  Please follow these instructions carefully:   1.   Shower with CHG Soap the night before surgery and the                                morning of Surgery.  2.  If you choose to wash your hair, wash your hair first as usual with your       normal shampoo.  3.  After you shampoo, rinse your hair and body thoroughly to remove the                      Shampoo.  4.  Use CHG as you would any other liquid soap.  You can apply chg directly       to the skin and wash gently with scrungie or a clean washcloth.  5.  Apply the CHG Soap to your body ONLY FROM THE NECK DOWN.        Do not use on open wounds or open sores.  Avoid contact with your eyes, ears, mouth and genitals (  private parts).  Wash genitals (private parts) with your normal soap.  6.  Wash thoroughly, paying special attention to the area where your surgery        will be performed.  7.  Thoroughly rinse your body with warm water from the neck down.  8.  DO NOT shower/wash with your normal soap after using and rinsing off       the CHG Soap.  9.  Pat yourself dry with a clean towel.            10.  Wear clean pajamas.            11.  Place clean sheets on your bed the night of your first shower and do not        sleep with pets.  Day of Surgery  Do not apply any lotions the morning of surgery.  Please wear clean clothes to the hospital.     Please read over the following fact sheets that you were given: Pain Booklet, Coughing and Deep Breathing, Blood Transfusion Information, MRSA Information and Surgical Site Infection Prevention

## 2014-04-11 NOTE — Progress Notes (Signed)
   04/11/14 1113  OBSTRUCTIVE SLEEP APNEA  Have you ever been diagnosed with sleep apnea through a sleep study? No  Do you snore loudly (loud enough to be heard through closed doors)?  0  Do you often feel tired, fatigued, or sleepy during the daytime? 0  Has anyone observed you stop breathing during your sleep? 0  Do you have, or are you being treated for high blood pressure? 0  BMI more than 35 kg/m2? 1  Age over 53 years old? 1  Neck circumference greater than 40 cm/16 inches? 1  Gender: 1  Obstructive Sleep Apnea Score 4  Score 4 or greater  Results sent to PCP

## 2014-04-12 LAB — URINE CULTURE

## 2014-04-12 NOTE — Progress Notes (Addendum)
Anesthesia Chart Review:  Patient is a 53 year old male scheduled for left THA on 04/19/14 by Dr. Turner Danielsowan.  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 BMI is consistent with morbid obesity.  PCP is listed as Dr. Alben DeedsJames Beekman.   Meds: Benadryl, Neurontin, lisinopril, metformin.   Preoperative labs noted.  Urine culture is still pending.   CXR on 04/11/14: No active cardiopulmonary disease.  EKG on 04/11/14: SR with first degree AVB, anteroseptal infarct (age undetermined). First degree AVB and anteroseptal infarct are both new when compared to EKGs from 2014 in Epic.  Echo on 01/21/13:  - Left ventricle: The cavity size was normal. Systolic function was mildly reduced. The estimated ejection fraction was in the range of 45% to 50%. Cannot exclude moderate hypokinesis of the anteroseptal myocardium. Doppler parameters are consistent with abnormal left ventricular relaxation (grade 1 diastolic dysfunction). - Atrial septum: No defect or patent foramen ovale was identified. - Trivial mitral regurgitation. (01/25/13 discharge instructions include cardiology follow-up with Dr. Nona DellSamuel McDowell in one week regarding echo results. I don't see that he was ever seen by cardiology on an in-patient or out-patient basis.)  Discussed with anesthesiologist Dr. Randa EvensEdwards who agrees with need for preoperative cardiology clearance. Olegario MessierKathy at Dr. Wadie Lessenowan's office was able to get him an appointment with Dr. Wyline MoodBranch [facility] on 04/15/14.  She will notify the patient. Plans to proceed with depend on cardiology recommendations/findings.  Velna Ochsllison Chuck Caban, PA-C [facility]esthesiology Phone [phone] 04/12/2014 12:36 PM  Addendum:  Patient was evaluated by cardiologist Dr. Wyline MoodBranch on 04/15/14 for a  preoperative evaluation. Nuclear stress test was ordered.  It was done on 04/17/14 and showed:   1. No reversible ischemia or infarction. Inferior wall soft tissue attenuation artifact noted. 2. Normal left ventricular wall motion. 3. Left ventricular ejection fraction 63% 4. Low-risk stress test findings*.  Velna Ochsllison Porsche Noguchi, PA-C [facility] Phone [phone] 04/18/2014 9:55 AM

## 2014-04-15 ENCOUNTER — Ambulatory Visit (INDEPENDENT_AMBULATORY_CARE_PROVIDER_SITE_OTHER): Payer: BC Managed Care – PPO | Admitting: Cardiology

## 2014-04-15 ENCOUNTER — Encounter: Payer: Self-pay | Admitting: Cardiology

## 2014-04-15 ENCOUNTER — Encounter: Payer: Self-pay | Admitting: *Deleted

## 2014-04-15 ENCOUNTER — Telehealth: Payer: Self-pay | Admitting: Cardiology

## 2014-04-15 VITALS — BP 123/76 | HR 81 | Ht 65.0 in | Wt 262.4 lb

## 2014-04-15 DIAGNOSIS — R9431 Abnormal electrocardiogram [ECG] [EKG]: Secondary | ICD-10-CM

## 2014-04-15 DIAGNOSIS — Z01810 Encounter for preprocedural cardiovascular examination: Secondary | ICD-10-CM

## 2014-04-15 MED ORDER — ASPIRIN EC 81 MG PO TBEC: 81.0000 mg | DELAYED_RELEASE_TABLET | Freq: Every day | ORAL | Status: DC

## 2014-04-15 NOTE — Patient Instructions (Signed)
Your physician recommends that you schedule a follow-up appointment in: pending stress test results. Your physician has recommended you make the following change in your medication:  Start aspirin 81 mg daily. Continue all other medications the same. Your physician has requested that you have a lexiscan myoview. For further information please visit [email or web address]. Please follow instruction sheet, as given.

## 2014-04-15 NOTE — Telephone Encounter (Signed)
[facility]

## 2014-04-15 NOTE — Progress Notes (Addendum)
Clinical Summary Luke [name] is a 53 y.o.male seen today as a new patient for the following medical problems.   1. Preoperative cardiac evaluation - being considered for hip replacement surgery - no known history of heart troubles. Denies any chest pain, denies any SOB or DOE though sedentary lifestyle due to chronic hip problems. Highest level of exertion is walking around at home with is walker. Reports occasional LE edema.   - echo 01/2013 LVEF 45-50%, cannot exlude moderate anteroseptal hypokinesis. EKG suggests prior anteroseptal infarct.  CAD risk factors: DM, + tobacco,    Past Medical History  Diagnosis Date  . DM (diabetes mellitus)   . Unintentional weight loss 10/16/2012  . DM (diabetes mellitus) type 2, uncontrolled, with ketoacidosis 10/16/2012    DKA  . Gout     Question diagnosis  . Protein-calorie malnutrition, severe 10/20/2012  . Cervical stenosis of spine 10/20/2012    Mild per CT  . Right inguinal hernia 10/20/2012    Large, without obstruction  . Cholelithiasis 10/20/2012  . 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 Chronic inflammatory arthritis 10/21/2012  . Pain, joint, multiple sites   . Pneumonia      Allergies  Allergen Reactions  . Bee Venom Swelling  . Shellfish Allergy Nausea And Vomiting     Current Outpatient Prescriptions  Medication Sig Dispense Refill  . diphenhydrAMINE (BENADRYL) 25 MG tablet Take 25-50 mg by mouth daily as needed for itching or allergies.     . feeding supplement (GLUCERNA SHAKE) LIQD Take 237 mLs by mouth 2 (two) times daily between meals. (Patient not taking: Reported on 04/11/2014)  0  . gabapentin (NEURONTIN) 300 MG capsule Take 300 mg by mouth 2 (two) times daily.     Marland Kitchen. HYDROcodone-acetaminophen (NORCO/VICODIN) 5-325 MG per tablet Take 1 tablet by mouth every 6 (six) hours as needed for pain. (Patient not taking: Reported on 04/11/2014) 30 tablet 0  . insulin aspart (NOVOLOG) 100 UNIT/ML injection  Inject 0-15 Units into the skin 3 (three) times daily with meals. Before each meal 3 times a day, 140-199 - 2 units, 200-250 - 4 units, 251-299 - 6 units,  300-349 - 8 units,  350 or above 10 units. (Patient not taking: Reported on 04/11/2014) 1 vial 12  . lisinopril (PRINIVIL,ZESTRIL) 2.5 MG tablet Take 2.5 mg by mouth daily.  3  . metFORMIN (GLUCOPHAGE) 500 MG tablet Take 500 mg by mouth 2 (two) times daily with a meal.    . Multiple Vitamin (MULTIVITAMIN WITH MINERALS) TABS Take 1 tablet by mouth daily. (Patient not taking: Reported on 04/11/2014)    . polyethylene glycol (MIRALAX / GLYCOLAX) packet Take 17 g by mouth daily. (Patient not taking: Reported on 04/11/2014) 14 each 0   No current facility-administered medications for this visit.     Past Surgical History  Procedure Laterality Date  . No past surgeries       Allergies  Allergen Reactions  . Bee Venom Swelling  . Shellfish Allergy Nausea And Vomiting      Family History  Problem Relation Age of Onset  . Arthritis    . Diabetes Brother   . Diabetes Brother   . CVA Mother   . Anxiety disorder Father      Social History Luke [name] reports that he has been smoking Cigarettes.  He has been smoking about 1.00 pack per day. He has never used smokeless tobacco. Luke [name] reports that he does not drink alcohol.  Review of Systems CONSTITUTIONAL: No weight loss, fever, chills, weakness or fatigue.  HEENT: Eyes: No visual loss, blurred vision, double vision or yellow sclerae.No hearing loss, sneezing, congestion, runny nose or sore throat.  SKIN: No rash or itching.  CARDIOVASCULAR: per HPI RESPIRATORY: No shortness of breath, cough or sputum.  GASTROINTESTINAL: No anorexia, nausea, vomiting or diarrhea. No abdominal pain or blood.  GENITOURINARY: No burning on urination, no polyuria NEUROLOGICAL: No headache, dizziness, syncope, paralysis, ataxia, numbness or tingling in the extremities. No change in bowel or bladder  control.  MUSCULOSKELETAL: + hip pain LYMPHATICS: No enlarged nodes. No history of splenectomy.  PSYCHIATRIC: No history of depression or anxiety.  ENDOCRINOLOGIC: No reports of sweating, cold or heat intolerance. No polyuria or polydipsia.  Marland Kitchen.   Physical Examination p 81 bp 123/76 Wt 262 lbs BMI 44 Gen: resting comfortably, no acute distress HEENT: no scleral icterus, pupils equal round and reactive, no palptable cervical adenopathy,  CV: RRR, no m/r/g, no JVD Resp: Clear to auscultation bilaterally GI: abdomen is soft, non-tender, non-distended, normal bowel sounds, no hepatosplenomegaly MSK: extremities are warm, no edema.  Skin: warm, no rash Neuro:  no focal deficits Psych: appropriate affect   Diagnostic Studies 01/2013 Echo Study Conclusions  - Left ventricle: The cavity size was normal. Systolic function was mildly reduced. The estimated ejection fraction was in the range of 45% to 50%. Cannot exclude moderate hypokinesis of the anteroseptal myocardium. Doppler parameters are consistent with abnormal left ventricular relaxation (grade 1 diastolic dysfunction). - Atrial septum: No defect or patent foramen ovale was identified.    Assessment and Plan  1. Preoperative cardiac evaluation - patient being considered for intermediate risk ortho surgery. He has no active acute cardiac conditions. Unable to exercise his exercise functional capacity by history due to chronic hip pain. His echo and EKG suggest prior anteroseptal infarct and low normal to mildly decreased LVEF, he has CAD risk factors including age, DM, tobacco - obtain Lexiscan MPI to further risk stratify, especially given his known CAD risk factors and suggestion of prior infarct by echo and EKG. Further surgical recs pendings stress results, cardiac f/u pending stress results        Antoine PocheJonathan F. Branch, M.D.    04/18/14 Addendum Lexiscan MPI shows no evidence of ischemia or infarct. From  cardiac standpoint no contraindication to surgery at this time. Recommend proceeding as scheduled   Dominga FerryJ Branch MD

## 2014-04-15 NOTE — Telephone Encounter (Signed)
[employer] [place] PER LINDSEY P, [phone] NO PAC RQD/CH

## 2014-04-17 ENCOUNTER — Encounter (HOSPITAL_COMMUNITY)
Admission: RE | Admit: 2014-04-17 | Discharge: 2014-04-17 | Disposition: A | Discharge status: Home / Self Care | Payer: BC Managed Care – PPO | Source: Ambulatory Visit | Attending: Cardiology | Admitting: Cardiology

## 2014-04-17 ENCOUNTER — Encounter (HOSPITAL_COMMUNITY): Payer: Self-pay

## 2014-04-17 ENCOUNTER — Ambulatory Visit (HOSPITAL_COMMUNITY)
Admission: RE | Admit: 2014-04-17 | Discharge: 2014-04-17 | Disposition: A | Discharge status: Home / Self Care | Payer: BC Managed Care – PPO | Source: Ambulatory Visit | Attending: Cardiology | Admitting: Cardiology

## 2014-04-17 DIAGNOSIS — R9431 Abnormal electrocardiogram [ECG] [EKG]: Secondary | ICD-10-CM

## 2014-04-17 DIAGNOSIS — R079 Chest pain, unspecified: Secondary | ICD-10-CM | POA: Insufficient documentation

## 2014-04-17 DIAGNOSIS — R109 Unspecified abdominal pain: Secondary | ICD-10-CM | POA: Diagnosis not present

## 2014-04-17 DIAGNOSIS — Z01810 Encounter for preprocedural cardiovascular examination: Secondary | ICD-10-CM

## 2014-04-17 MED ORDER — REGADENOSON 0.4 MG/5ML IV SOLN
INTRAVENOUS | Status: AC
Start: 2014-04-17 — End: 2014-04-17
  Administered 2014-04-17: 0.4 mg via INTRAVENOUS
  Filled 2014-04-17: qty 5

## 2014-04-17 MED ORDER — TECHNETIUM TC 99M SESTAMIBI - CARDIOLITE
30.0000 | Freq: Once | INTRAVENOUS | Status: AC | PRN
  Administered 2014-04-17: 11:00:00 30 via INTRAVENOUS

## 2014-04-17 MED ORDER — TECHNETIUM TC 99M SESTAMIBI GENERIC - CARDIOLITE
10.0000 | Freq: Once | INTRAVENOUS | Status: AC | PRN
  Administered 2014-04-17: 10 via INTRAVENOUS

## 2014-04-17 MED ORDER — REGADENOSON 0.4 MG/5ML IV SOLN
0.4000 mg | Freq: Once | INTRAVENOUS | Status: AC | PRN
  Administered 2014-04-17: 0.4 mg via INTRAVENOUS

## 2014-04-17 MED ORDER — SODIUM CHLORIDE 0.9 % IJ SOLN
INTRAMUSCULAR | Status: AC
Start: 2014-04-17 — End: 2014-04-17
  Administered 2014-04-17: 10 mL via INTRAVENOUS
  Filled 2014-04-17: qty 10

## 2014-04-17 MED ORDER — SODIUM CHLORIDE 0.9 % IJ SOLN
10.0000 mL | INTRAMUSCULAR | Status: DC | PRN
  Administered 2014-04-17: 10 mL via INTRAVENOUS
  Filled 2014-04-17: qty 10

## 2014-04-17 NOTE — Progress Notes (Signed)
Stress Lab Nurses Notes - [facility]  Doristine LocksMelvin C Devera 04/17/2014 Reason for doing test: Surgical Clearance and abnormal EKG Type of test: Marlane HatcherLexiscan Cardiolite Nurse performing test: Parke PoissonPhyllis Billingsly, RN Nuclear Medicine Tech: Marcella DubsMiranda Womack Echo Tech: Not Applicable MD performing test: Koneswaran/M.Geni BersLenze PA Family MD: [facility] Test explained and consent signed: Yes.   IV started: Saline lock flushed, No redness or edema and Saline lock started in radiology Symptoms: discomfort in chest & stomache Treatment/Intervention: None Reason test stopped: protocol completed After recovery IV was: Discontinued via X-ray tech and No redness or edema Patient to return to Nuc. Med at : 11:45 Patient discharged: Home Patient's Condition upon discharge was: stable Comments: During test BP 135/66 & HR 108.  Recovery BP 136/65 & HR 93.  Symptoms resolved in recovery.  Erskine SpeedBillingsley, Kosisochukwu Burningham T

## 2014-04-17 NOTE — H&P (Signed)
TOTAL HIP ADMISSION H&P  Patient is admitted for left total hip arthroplasty.  Subjective:  Chief Complaint: left hip pain  HPI: Luke [name], 53 y.o. male, has a history of pain and functional disability in the left hip(s) due to arthritis and patient has failed non-surgical conservative treatments for greater than 12 weeks to include NSAID's and/or analgesics, use of assistive devices, weight reduction as appropriate and activity modification.  Onset of symptoms was gradual starting 3 years ago with gradually worsening course since that time.The patient noted no past surgery on the left hip(s).  Patient currently rates pain in the left hip at 10 out of 10 with activity. Patient has night pain, worsening of pain with activity and weight bearing, trendelenberg gait, pain that interfers with activities of daily living, pain with passive range of motion and crepitus. Patient has evidence of subchondral sclerosis, periarticular osteophytes and joint space narrowing by imaging studies. This condition presents safety issues increasing the risk of falls. There is no current active infection.  Patient Active Problem List   Diagnosis Date Noted  . Left hip pain 01/17/2013  . Chronic steroid use 01/17/2013  . Septic arthritis of hip 01/17/2013  . 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 Chronic inflammatory arthritis 10/21/2012  . Protein-calorie malnutrition, severe 10/20/2012  . Cervical stenosis of spine 10/20/2012  . Right inguinal hernia 10/20/2012  . Cholelithiasis 10/20/2012  . Neck stiffness 10/19/2012  . Neck pain, bilateral 10/19/2012  . Cellulitis of leg, right 10/19/2012  . Sinus tachycardia 10/18/2012  . Leg edema, right 10/18/2012  . Unintentional weight loss 10/18/2012  . Hyponatremia 10/18/2012  . Cellulitis of left lower extremity 10/18/2012  . DM (diabetes mellitus) type 2, uncontrolled, with  ketoacidosis 10/17/2012  . Acute gout 10/17/2012  . Obesity 10/17/2012  . DKA (diabetic ketoacidoses) 10/15/2012  . Diabetes mellitus type 2 in obese 10/15/2012  . Ulna distal fracture 12/22/2010   Past Medical History  Diagnosis Date  . DM (diabetes mellitus)   . Unintentional weight loss 10/16/2012  . DM (diabetes mellitus) type 2, uncontrolled, with ketoacidosis 10/16/2012    DKA  . Gout     Question diagnosis  . Protein-calorie malnutrition, severe 10/20/2012  . Cervical stenosis of spine 10/20/2012    Mild per CT  . Right inguinal hernia 10/20/2012    Large, without obstruction  . Cholelithiasis 10/20/2012  . 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 Chronic inflammatory arthritis 10/21/2012  . Pain, joint, multiple sites   . Pneumonia     Past Surgical History  Procedure Laterality Date  . No past surgeries      No prescriptions prior to admission   Allergies  Allergen Reactions  . Bee Venom Swelling  . Shellfish Allergy Nausea And Vomiting    History  Substance Use Topics  . Smoking status: Current Every Day Smoker -- 1.00 packs/day    Types: Cigarettes    Last Attempt to Quit: 10/14/2012  . Smokeless tobacco: Never Used     Comment: trying to quit  . Alcohol Use: No    Family History  Problem Relation Age of Onset  . Arthritis    . Diabetes Brother   . Diabetes Brother   . CVA Mother   . Anxiety disorder Father      Review of Systems  Constitutional: Positive for weight loss.  HENT: Negative.   Eyes: Negative.   Respiratory: Negative.  Cardiovascular: Negative.   Gastrointestinal: Negative.   Genitourinary: Negative.   Musculoskeletal: Positive for joint pain.  Skin: Negative.   Neurological: Negative.   Endo/Heme/Allergies: Negative.   Psychiatric/Behavioral: Negative.     Objective:  Physical Exam  Constitutional: He is oriented to person, place, and time. He appears well-developed and well-nourished.  HENT:  Head:  Normocephalic and atraumatic.  Eyes: Pupils are equal, round, and reactive to light.  Neck: Normal range of motion. Neck supple.  Cardiovascular: Intact distal pulses.   Respiratory: Effort normal.  Musculoskeletal: He exhibits tenderness.    He is ambulating with the assistance of a walker.  Patient's right hip has good strength good range of motion and no pain.  Patient's left hip does have obvious stiffness with internal rotation only approximately 10 and external rotation to approximately 30.  This does cause mild pain.  He has brisk capillary refill and is neurovascularly intact distally.  Neurological: He is alert and oriented to person, place, and time.  Skin: Skin is warm and dry.  Psychiatric: He has a normal mood and affect. His behavior is normal. Judgment and thought content normal.    Vital signs in last 24 hours:    Labs:   Estimated body mass index is 36.69 kg/(m^2) as calculated from the following:   Height as of 01/17/13: 5\' 6"  (1.676 m).   Weight as of 01/16/13: 103.057 kg (227 lb 3.2 oz).   Imaging Review Plain radiographs demonstrate left hip with significant erosion of the femoral acetabular lesion.  There is no heterotopic bone formation.    Assessment/Plan:  End stage arthritis, left hip(s)  The patient history, physical examination, clinical judgement of the provider and imaging studies are consistent with end stage degenerative joint disease of the left hip(s) and total hip arthroplasty is deemed medically necessary. The treatment options including medical management, injection therapy, arthroscopy and arthroplasty were discussed at length. The risks and benefits of total hip arthroplasty were presented and reviewed. The risks due to aseptic loosening, infection, stiffness, dislocation/subluxation,  thromboembolic complications and other imponderables were discussed.  The patient acknowledged the explanation, agreed to proceed with the plan and consent was  signed. Patient is being admitted for inpatient treatment for surgery, pain control, PT, OT, prophylactic antibiotics, VTE prophylaxis, progressive ambulation and ADL's and discharge planning.The patient is planning to be discharged to skilled nursing facility

## 2014-04-18 MED ORDER — CEFAZOLIN SODIUM-DEXTROSE 2-3 GM-% IV SOLR
2.0000 g | INTRAVENOUS | Status: AC
  Administered 2014-04-19: 2 g via INTRAVENOUS
  Filled 2014-04-18: qty 50

## 2014-04-19 ENCOUNTER — Ambulatory Visit (HOSPITAL_COMMUNITY): Payer: BC Managed Care – PPO | Admitting: Anesthesiology

## 2014-04-19 ENCOUNTER — Encounter (HOSPITAL_COMMUNITY): Payer: Self-pay | Admitting: *Deleted

## 2014-04-19 ENCOUNTER — Inpatient Hospital Stay (HOSPITAL_COMMUNITY): Payer: BC Managed Care – PPO

## 2014-04-19 ENCOUNTER — Ambulatory Visit (HOSPITAL_COMMUNITY): Payer: BC Managed Care – PPO | Admitting: Vascular Surgery

## 2014-04-19 ENCOUNTER — Inpatient Hospital Stay (HOSPITAL_COMMUNITY)
Admission: RE | Admit: 2014-04-19 | Discharge: 2014-04-22 | DRG: 470 | Disposition: A | Discharge status: Skilled Nursing Facility | Payer: BC Managed Care – PPO | Source: Ambulatory Visit | Attending: Orthopedic Surgery | Admitting: Orthopedic Surgery

## 2014-04-19 ENCOUNTER — Encounter (HOSPITAL_COMMUNITY)
Admission: RE | Disposition: A | Discharge status: Skilled Nursing Facility | Payer: Self-pay | Source: Ambulatory Visit | Attending: Orthopedic Surgery

## 2014-04-19 DIAGNOSIS — M1612 Unilateral primary osteoarthritis, left hip: Principal | ICD-10-CM | POA: Diagnosis present

## 2014-04-19 DIAGNOSIS — E669 Obesity, unspecified: Secondary | ICD-10-CM | POA: Diagnosis present

## 2014-04-19 DIAGNOSIS — M109 Gout, unspecified: Secondary | ICD-10-CM | POA: Diagnosis present

## 2014-04-19 DIAGNOSIS — D891 Cryoglobulinemia: Secondary | ICD-10-CM | POA: Diagnosis present

## 2014-04-19 DIAGNOSIS — M4802 Spinal stenosis, cervical region: Secondary | ICD-10-CM | POA: Diagnosis present

## 2014-04-19 DIAGNOSIS — D62 Acute posthemorrhagic anemia: Secondary | ICD-10-CM | POA: Diagnosis not present

## 2014-04-19 DIAGNOSIS — L03116 Cellulitis of left lower limb: Secondary | ICD-10-CM | POA: Diagnosis present

## 2014-04-19 DIAGNOSIS — K4090 Unilateral inguinal hernia, without obstruction or gangrene, not specified as recurrent: Secondary | ICD-10-CM | POA: Diagnosis present

## 2014-04-19 DIAGNOSIS — F17210 Nicotine dependence, cigarettes, uncomplicated: Secondary | ICD-10-CM | POA: Diagnosis present

## 2014-04-19 DIAGNOSIS — E1165 Type 2 diabetes mellitus with hyperglycemia: Secondary | ICD-10-CM | POA: Diagnosis present

## 2014-04-19 DIAGNOSIS — Z96642 Presence of left artificial hip joint: Secondary | ICD-10-CM

## 2014-04-19 DIAGNOSIS — Z7952 Long term (current) use of systemic steroids: Secondary | ICD-10-CM | POA: Diagnosis not present

## 2014-04-19 DIAGNOSIS — R634 Abnormal weight loss: Secondary | ICD-10-CM | POA: Diagnosis present

## 2014-04-19 DIAGNOSIS — M064 Inflammatory polyarthropathy: Secondary | ICD-10-CM | POA: Diagnosis present

## 2014-04-19 DIAGNOSIS — Z794 Long term (current) use of insulin: Secondary | ICD-10-CM

## 2014-04-19 DIAGNOSIS — Z6841 Body Mass Index (BMI) 40.0 and over, adult: Secondary | ICD-10-CM

## 2014-04-19 HISTORY — PX: TOTAL HIP ARTHROPLASTY: SHX124

## 2014-04-19 LAB — GLUCOSE, CAPILLARY
GLUCOSE-CAPILLARY: 168 mg/dL — AB (ref 70–99)
GLUCOSE-CAPILLARY: 163 mg/dL — AB (ref 70–99)
Glucose-Capillary: 96 mg/dL (ref 70–99)

## 2014-04-19 LAB — GRAM STAIN

## 2014-04-19 SURGERY — ARTHROPLASTY, HIP, TOTAL,POSTERIOR APPROACH
Anesthesia: General | Site: Hip | Laterality: Left

## 2014-04-19 MED ORDER — HYDROMORPHONE HCL 1 MG/ML IJ SOLN: 1.0000 mg | INTRAMUSCULAR | Status: DC | PRN

## 2014-04-19 MED ORDER — METOCLOPRAMIDE HCL 5 MG/ML IJ SOLN: 5.0000 mg | Freq: Three times a day (TID) | INTRAMUSCULAR | Status: DC | PRN

## 2014-04-19 MED ORDER — BUPIVACAINE-EPINEPHRINE (PF) 0.25% -1:200000 IJ SOLN
INTRAMUSCULAR | Status: DC | PRN
  Administered 2014-04-19: 20 mL

## 2014-04-19 MED ORDER — DIPHENHYDRAMINE HCL 12.5 MG/5ML PO ELIX
12.5000 mg | ORAL_SOLUTION | ORAL | Status: DC | PRN
  Filled 2014-04-19: qty 10

## 2014-04-19 MED ORDER — PROPOFOL 10 MG/ML IV BOLUS
INTRAVENOUS | Status: DC | PRN
  Administered 2014-04-19: 50 mg via INTRAVENOUS
  Administered 2014-04-19: 150 mg via INTRAVENOUS
  Administered 2014-04-19: 50 mg via INTRAVENOUS

## 2014-04-19 MED ORDER — PROPOFOL 10 MG/ML IV BOLUS
INTRAVENOUS | Status: AC
  Filled 2014-04-19: qty 20

## 2014-04-19 MED ORDER — DOCUSATE SODIUM 100 MG PO CAPS
100.0000 mg | ORAL_CAPSULE | Freq: Two times a day (BID) | ORAL | Status: DC
  Administered 2014-04-19 – 2014-04-22 (×6): 100 mg via ORAL
  Filled 2014-04-19 (×6): qty 1

## 2014-04-19 MED ORDER — ASPIRIN EC 325 MG PO TBEC: 325.0000 mg | DELAYED_RELEASE_TABLET | Freq: Two times a day (BID) | ORAL | Status: AC

## 2014-04-19 MED ORDER — OXYCODONE HCL 5 MG PO TABS
5.0000 mg | ORAL_TABLET | ORAL | Status: DC | PRN
  Administered 2014-04-20 – 2014-04-22 (×5): 10 mg via ORAL
  Filled 2014-04-19 (×5): qty 2

## 2014-04-19 MED ORDER — ROCURONIUM BROMIDE 100 MG/10ML IV SOLN
INTRAVENOUS | Status: DC | PRN
  Administered 2014-04-19: 50 mg via INTRAVENOUS

## 2014-04-19 MED ORDER — SODIUM CHLORIDE 0.9 % IR SOLN
Status: DC | PRN
  Administered 2014-04-19: 1000 mL

## 2014-04-19 MED ORDER — ONDANSETRON HCL 4 MG PO TABS: 4.0000 mg | ORAL_TABLET | Freq: Four times a day (QID) | ORAL | Status: DC | PRN

## 2014-04-19 MED ORDER — GABAPENTIN 300 MG PO CAPS
300.0000 mg | ORAL_CAPSULE | Freq: Two times a day (BID) | ORAL | Status: DC
  Administered 2014-04-19 – 2014-04-22 (×6): 300 mg via ORAL
  Filled 2014-04-19 (×7): qty 1

## 2014-04-19 MED ORDER — TRANEXAMIC ACID 100 MG/ML IV SOLN
2000.0000 mg | INTRAVENOUS | Status: DC
  Filled 2014-04-19: qty 20

## 2014-04-19 MED ORDER — MIDAZOLAM HCL 2 MG/2ML IJ SOLN
INTRAMUSCULAR | Status: AC
  Filled 2014-04-19: qty 2

## 2014-04-19 MED ORDER — ASPIRIN EC 325 MG PO TBEC
325.0000 mg | DELAYED_RELEASE_TABLET | Freq: Every day | ORAL | Status: DC
  Administered 2014-04-20 – 2014-04-22 (×3): 325 mg via ORAL
  Filled 2014-04-19 (×4): qty 1

## 2014-04-19 MED ORDER — MENTHOL 3 MG MT LOZG: 1.0000 | LOZENGE | OROMUCOSAL | Status: DC | PRN

## 2014-04-19 MED ORDER — ONDANSETRON HCL 4 MG/2ML IJ SOLN
INTRAMUSCULAR | Status: AC
  Filled 2014-04-19: qty 2

## 2014-04-19 MED ORDER — DEXTROSE-NACL 5-0.45 % IV SOLN: INTRAVENOUS | Status: DC

## 2014-04-19 MED ORDER — LACTATED RINGERS IV SOLN
INTRAVENOUS | Status: DC | PRN
  Administered 2014-04-19 (×2): via INTRAVENOUS

## 2014-04-19 MED ORDER — NEOSTIGMINE METHYLSULFATE 10 MG/10ML IV SOLN
INTRAVENOUS | Status: DC | PRN
  Administered 2014-04-19: 4 mg via INTRAVENOUS

## 2014-04-19 MED ORDER — BUPIVACAINE-EPINEPHRINE (PF) 0.25% -1:200000 IJ SOLN
INTRAMUSCULAR | Status: AC
  Filled 2014-04-19: qty 30

## 2014-04-19 MED ORDER — FENTANYL CITRATE 0.05 MG/ML IJ SOLN
INTRAMUSCULAR | Status: AC
  Filled 2014-04-19: qty 5

## 2014-04-19 MED ORDER — NEOSTIGMINE METHYLSULFATE 10 MG/10ML IV SOLN
INTRAVENOUS | Status: AC
  Filled 2014-04-19: qty 1

## 2014-04-19 MED ORDER — PHENYLEPHRINE HCL 10 MG/ML IJ SOLN
INTRAMUSCULAR | Status: DC | PRN
  Administered 2014-04-19 (×6): 80 ug via INTRAVENOUS
  Administered 2014-04-19: 160 ug via INTRAVENOUS

## 2014-04-19 MED ORDER — GLYCOPYRROLATE 0.2 MG/ML IJ SOLN
INTRAMUSCULAR | Status: AC
  Filled 2014-04-19: qty 3

## 2014-04-19 MED ORDER — METHOCARBAMOL 1000 MG/10ML IJ SOLN
500.0000 mg | Freq: Four times a day (QID) | INTRAVENOUS | Status: DC | PRN
  Filled 2014-04-19: qty 5

## 2014-04-19 MED ORDER — ACETAMINOPHEN 650 MG RE SUPP: 650.0000 mg | Freq: Four times a day (QID) | RECTAL | Status: DC | PRN

## 2014-04-19 MED ORDER — OXYCODONE-ACETAMINOPHEN 5-325 MG PO TABS
1.0000 | ORAL_TABLET | ORAL | Status: DC | PRN
Start: 2014-04-19 — End: 2021-10-12

## 2014-04-19 MED ORDER — TRANEXAMIC ACID 100 MG/ML IV SOLN
2000.0000 mg | INTRAVENOUS | Status: DC | PRN
  Administered 2014-04-19: 2000 mg via TOPICAL

## 2014-04-19 MED ORDER — PHENOL 1.4 % MT LIQD: 1.0000 | OROMUCOSAL | Status: DC | PRN

## 2014-04-19 MED ORDER — LACTATED RINGERS IV SOLN
INTRAVENOUS | Status: DC
  Administered 2014-04-19: 11:00:00 via INTRAVENOUS

## 2014-04-19 MED ORDER — METHOCARBAMOL 500 MG PO TABS
500.0000 mg | ORAL_TABLET | Freq: Four times a day (QID) | ORAL | Status: DC | PRN
  Administered 2014-04-20 – 2014-04-21 (×2): 500 mg via ORAL
  Filled 2014-04-19 (×2): qty 1

## 2014-04-19 MED ORDER — GLYCOPYRROLATE 0.2 MG/ML IJ SOLN
INTRAMUSCULAR | Status: DC | PRN
  Administered 2014-04-19: 0.2 mg via INTRAVENOUS
  Administered 2014-04-19: 0.6 mg via INTRAVENOUS

## 2014-04-19 MED ORDER — LIDOCAINE HCL (CARDIAC) 20 MG/ML IV SOLN
INTRAVENOUS | Status: DC | PRN
  Administered 2014-04-19: 5 mL via INTRAVENOUS

## 2014-04-19 MED ORDER — FLEET ENEMA 7-19 GM/118ML RE ENEM: 1.0000 | ENEMA | Freq: Once | RECTAL | Status: AC | PRN

## 2014-04-19 MED ORDER — ONDANSETRON HCL 4 MG/2ML IJ SOLN: 4.0000 mg | Freq: Four times a day (QID) | INTRAMUSCULAR | Status: DC | PRN

## 2014-04-19 MED ORDER — FENTANYL CITRATE 0.05 MG/ML IJ SOLN
INTRAMUSCULAR | Status: DC | PRN
  Administered 2014-04-19 (×6): 50 ug via INTRAVENOUS
  Administered 2014-04-19: 100 ug via INTRAVENOUS

## 2014-04-19 MED ORDER — ONDANSETRON HCL 4 MG/2ML IJ SOLN
INTRAMUSCULAR | Status: DC | PRN
  Administered 2014-04-19: 4 mg via INTRAVENOUS

## 2014-04-19 MED ORDER — MIDAZOLAM HCL 5 MG/5ML IJ SOLN
INTRAMUSCULAR | Status: DC | PRN
  Administered 2014-04-19 (×2): 1 mg via INTRAVENOUS

## 2014-04-19 MED ORDER — DEXAMETHASONE SODIUM PHOSPHATE 4 MG/ML IJ SOLN
INTRAMUSCULAR | Status: DC | PRN
  Administered 2014-04-19: 4 mg via INTRAVENOUS

## 2014-04-19 MED ORDER — INSULIN ASPART 100 UNIT/ML ~~LOC~~ SOLN
0.0000 [IU] | Freq: Three times a day (TID) | SUBCUTANEOUS | Status: DC
  Administered 2014-04-20 – 2014-04-22 (×3): 2 [IU] via SUBCUTANEOUS

## 2014-04-19 MED ORDER — PHENYLEPHRINE 40 MCG/ML (10ML) SYRINGE FOR IV PUSH (FOR BLOOD PRESSURE SUPPORT)
PREFILLED_SYRINGE | INTRAVENOUS | Status: AC
  Filled 2014-04-19: qty 20

## 2014-04-19 MED ORDER — KCL IN DEXTROSE-NACL 20-5-0.45 MEQ/L-%-% IV SOLN
INTRAVENOUS | Status: DC
  Administered 2014-04-19 – 2014-04-20 (×2): via INTRAVENOUS
  Filled 2014-04-19 (×11): qty 1000

## 2014-04-19 MED ORDER — HYDROMORPHONE HCL 1 MG/ML IJ SOLN: 0.2500 mg | INTRAMUSCULAR | Status: DC | PRN

## 2014-04-19 MED ORDER — SENNOSIDES-DOCUSATE SODIUM 8.6-50 MG PO TABS: 1.0000 | ORAL_TABLET | Freq: Every evening | ORAL | Status: DC | PRN

## 2014-04-19 MED ORDER — ALBUMIN HUMAN 5 % IV SOLN
INTRAVENOUS | Status: DC | PRN
  Administered 2014-04-19 (×2): via INTRAVENOUS

## 2014-04-19 MED ORDER — ROCURONIUM BROMIDE 50 MG/5ML IV SOLN
INTRAVENOUS | Status: AC
  Filled 2014-04-19: qty 1

## 2014-04-19 MED ORDER — ACETAMINOPHEN 325 MG PO TABS
650.0000 mg | ORAL_TABLET | Freq: Four times a day (QID) | ORAL | Status: DC | PRN
  Administered 2014-04-20: 650 mg via ORAL
  Filled 2014-04-19: qty 2

## 2014-04-19 MED ORDER — METHOCARBAMOL 500 MG PO TABS: 500.0000 mg | ORAL_TABLET | Freq: Two times a day (BID) | ORAL | Status: DC

## 2014-04-19 MED ORDER — METFORMIN HCL 500 MG PO TABS
500.0000 mg | ORAL_TABLET | Freq: Two times a day (BID) | ORAL | Status: DC
  Administered 2014-04-20 – 2014-04-22 (×6): 500 mg via ORAL
  Filled 2014-04-19 (×7): qty 1

## 2014-04-19 MED ORDER — METOCLOPRAMIDE HCL 10 MG PO TABS: 5.0000 mg | ORAL_TABLET | Freq: Three times a day (TID) | ORAL | Status: DC | PRN

## 2014-04-19 MED ORDER — BISACODYL 5 MG PO TBEC: 5.0000 mg | DELAYED_RELEASE_TABLET | Freq: Every day | ORAL | Status: DC | PRN

## 2014-04-19 MED ORDER — LISINOPRIL 2.5 MG PO TABS
2.5000 mg | ORAL_TABLET | Freq: Every day | ORAL | Status: DC
  Administered 2014-04-20 – 2014-04-22 (×3): 2.5 mg via ORAL
  Filled 2014-04-19 (×4): qty 1

## 2014-04-19 SURGICAL SUPPLY — 56 items
BLADE SAW SGTL 18X1.27X75 (BLADE) ×2 IMPLANT
BLADE SAW SGTL 18X1.27X75MM (BLADE) ×1
BRUSH FEMORAL CANAL (MISCELLANEOUS) IMPLANT
CAPT HIP TOTAL 2 ×3 IMPLANT
COVER BACK TABLE 24X17X13 BIG (DRAPES) IMPLANT
COVER SURGICAL LIGHT HANDLE (MISCELLANEOUS) ×6 IMPLANT
DRAPE IMP U-DRAPE 54X76 (DRAPES) ×3 IMPLANT
DRAPE ORTHO SPLIT 77X108 STRL (DRAPES) ×2
DRAPE PROXIMA HALF (DRAPES) ×3 IMPLANT
DRAPE SURG ORHT 6 SPLT 77X108 (DRAPES) ×1 IMPLANT
DRAPE U-SHAPE 47X51 STRL (DRAPES) ×3 IMPLANT
DRILL BIT 7/64X5 (BIT) ×3 IMPLANT
DRSG AQUACEL AG ADV 3.5X10 (GAUZE/BANDAGES/DRESSINGS) IMPLANT
DRSG AQUACEL AG ADV 3.5X14 (GAUZE/BANDAGES/DRESSINGS) ×3 IMPLANT
DURAPREP 26ML APPLICATOR (WOUND CARE) ×3 IMPLANT
ELECT BLADE 4.0 EZ CLEAN MEGAD (MISCELLANEOUS)
ELECT REM PT RETURN 9FT ADLT (ELECTROSURGICAL) ×3
ELECTRODE BLDE 4.0 EZ CLN MEGD (MISCELLANEOUS) IMPLANT
ELECTRODE REM PT RTRN 9FT ADLT (ELECTROSURGICAL) ×1 IMPLANT
GAUZE XEROFORM 1X8 LF (GAUZE/BANDAGES/DRESSINGS) ×3 IMPLANT
GLOVE BIO SURGEON STRL SZ7.5 (GLOVE) ×3 IMPLANT
GLOVE BIO SURGEON STRL SZ8.5 (GLOVE) ×6 IMPLANT
GLOVE BIOGEL PI IND STRL 8 (GLOVE) ×2 IMPLANT
GLOVE BIOGEL PI IND STRL 9 (GLOVE) ×1 IMPLANT
GLOVE BIOGEL PI INDICATOR 8 (GLOVE) ×4
GLOVE BIOGEL PI INDICATOR 9 (GLOVE) ×2
GOWN STRL REUS W/ TWL LRG LVL3 (GOWN DISPOSABLE) ×2 IMPLANT
GOWN STRL REUS W/ TWL XL LVL3 (GOWN DISPOSABLE) ×3 IMPLANT
GOWN STRL REUS W/TWL LRG LVL3 (GOWN DISPOSABLE) ×4
GOWN STRL REUS W/TWL XL LVL3 (GOWN DISPOSABLE) ×6
HANDPIECE INTERPULSE COAX TIP (DISPOSABLE)
HOOD PEEL AWAY FACE SHEILD DIS (HOOD) ×6 IMPLANT
KIT BASIN OR (CUSTOM PROCEDURE TRAY) ×3 IMPLANT
KIT ROOM TURNOVER OR (KITS) ×3 IMPLANT
MANIFOLD NEPTUNE II (INSTRUMENTS) ×3 IMPLANT
NEEDLE 22X1 1/2 (OR ONLY) (NEEDLE) ×3 IMPLANT
NS IRRIG 1000ML POUR BTL (IV SOLUTION) ×3 IMPLANT
PACK TOTAL JOINT (CUSTOM PROCEDURE TRAY) ×3 IMPLANT
PACK UNIVERSAL I (CUSTOM PROCEDURE TRAY) ×3 IMPLANT
PAD ARMBOARD 7.5X6 YLW CONV (MISCELLANEOUS) ×6 IMPLANT
PASSER SUT SWANSON 36MM LOOP (INSTRUMENTS) ×3 IMPLANT
PRESSURIZER FEMORAL UNIV (MISCELLANEOUS) IMPLANT
SET HNDPC FAN SPRY TIP SCT (DISPOSABLE) IMPLANT
SUT ETHIBOND 2 V 37 (SUTURE) ×3 IMPLANT
SUT VIC AB 0 CTB1 27 (SUTURE) ×3 IMPLANT
SUT VIC AB 1 CTX 36 (SUTURE) ×2
SUT VIC AB 1 CTX36XBRD ANBCTR (SUTURE) ×1 IMPLANT
SUT VIC AB 2-0 CTB1 (SUTURE) ×3 IMPLANT
SUT VIC AB 3-0 SH 27 (SUTURE) ×2
SUT VIC AB 3-0 SH 27X BRD (SUTURE) ×1 IMPLANT
SYR CONTROL 10ML LL (SYRINGE) ×3 IMPLANT
TOWEL OR 17X24 6PK STRL BLUE (TOWEL DISPOSABLE) ×3 IMPLANT
TOWEL OR 17X26 10 PK STRL BLUE (TOWEL DISPOSABLE) ×3 IMPLANT
TOWER CARTRIDGE SMART MIX (DISPOSABLE) IMPLANT
TRAY FOLEY CATH 14FR (SET/KITS/TRAYS/PACK) IMPLANT
WATER STERILE IRR 1000ML POUR (IV SOLUTION) ×12 IMPLANT

## 2014-04-19 NOTE — Anesthesia Procedure Notes (Addendum)
Procedure Name: Intubation Date/Time: 04/19/2014 1:45 PM Performed by: Fransisca KaufmannMEYER, Clete Kuch E Pre-anesthesia Checklist: Patient identified, Emergency Drugs available, Suction available, Patient being monitored and Timeout performed Patient Re-evaluated:Patient Re-evaluated prior to inductionOxygen Delivery Method: Circle system utilized Preoxygenation: Pre-oxygenation with 100% oxygen Intubation Type: IV induction Ventilation: Two handed mask ventilation required Tube type: oral tube placed nasally. Tube size: 8.0 mm Number of attempts: 4 Airway Equipment and Method: Fiberoptic brochoscope Placement Confirmation: ETT inserted through vocal cords under direct vision and positive ETCO2 Secured at: 28 cm Tube secured with: Tape Difficulty Due To: Difficulty was anticipated, Difficult Airway- due to reduced neck mobility and Difficult Airway- due to limited oral opening Comments: Pt presents with ankylosing spondylosis, cervical area.  Spinal  Placement attempted, unsuccessful, LMA placement unsuccessful/ intubated with fiberoptic bronchoscope nasally.  Pt remained optimally oxygen saturated during induction and  intubation process . He was an easy mask ventilation with two hand maneuver. Unable to open mouth to facilitate placement of Glidescope blade.

## 2014-04-19 NOTE — Plan of Care (Signed)
Problem: Consults Goal: Diagnosis- Total Joint Replacement Primary Total Hip Left     

## 2014-04-19 NOTE — Anesthesia Preprocedure Evaluation (Addendum)
Anesthesia Evaluation  Patient identified by MRN, date of birth, ID band Patient awake    Reviewed: Allergy & Precautions, H&P , NPO status , Patient's Chart, lab work & pertinent test results  Airway Mallampati: II  TM Distance: >3 FB Neck ROM: Limited    Dental no notable dental hx. (+) Teeth Intact, Dental Advisory Given   Pulmonary Current Smoker,  breath sounds clear to auscultation  Pulmonary exam normal       Cardiovascular negative cardio ROS  Rhythm:Regular Rate:Normal     Neuro/Psych negative neurological ROS  negative psych ROS   GI/Hepatic negative GI ROS, Neg liver ROS,   Endo/Other  diabetes, Type 2, Oral Hypoglycemic AgentsMorbid obesity  Renal/GU negative Renal ROS  negative genitourinary   Musculoskeletal  (+) Arthritis -, Osteoarthritis,    Abdominal   Peds  Hematology negative hematology ROS (+)   Anesthesia Other Findings   Reproductive/Obstetrics negative OB ROS                             Anesthesia Physical Anesthesia Plan  ASA: III  Anesthesia Plan: MAC and Spinal   Post-op Pain Management:    Induction: Intravenous  Airway Management Planned: Simple Face Mask  Additional Equipment:   Intra-op Plan:   Post-operative Plan:   Informed Consent: I have reviewed the patients History and Physical, chart, labs and discussed the procedure including the risks, benefits and alternatives for the proposed anesthesia with the patient or authorized representative who has indicated his/her understanding and acceptance.   Dental advisory given  Plan Discussed with: CRNA  Anesthesia Plan Comments:         Anesthesia Quick Evaluation

## 2014-04-19 NOTE — Op Note (Signed)
OPERATIVE REPORT    DATE OF PROCEDURE:  04/19/2014       PREOPERATIVE DIAGNOSIS:  LEFT HIP CHARCOT JOINT                                                          POSTOPERATIVE DIAGNOSIS:  LEFT HIP CHARCOT JOINT                                                           PROCEDURE:  L total hip arthroplasty using a 54 mm DePuy Gryption Cup, Pe[place]ody Energypex Hole Eliminator, 35mm dome screw, 10-degree polyethylene liner index superior  and posterior, a +0 36 mm ceramic head, a 20x15x42x150 SROM stem, 20Dsm Sleeve   SURGEON: Jalin Alicea J    ASSISTANT:   Eric K. Gaylene BrooksPhillips PA-C  (present throughout entire procedure and necessary for timely completion of the procedure)   ANESTHESIA: General BLOOD LOSS: 700 FLUID REPLACEMENT: 2000cc crystalloid COMPLICATIONS: none    INDICATIONS FOR PROCEDURE: A 53 y.o. year-old With  LEFT HIP CHARCOT JOINT\ AS   for 2 years, x-rays show bone-on-bone arthritic changes. Despite conservative measures with observation, anti-inflammatory medicine, narcotics, use of a cane, has severe unremitting pain and can ambulate only a few blocks before resting.  Patient desires elective L total hip arthroplasty to decrease pain and increase function. The risks, benefits, and alternatives were discussed at length including but not limited to the risks of infection, bleeding, nerve injury, stiffness, blood clots, the need for revision surgery, cardiopulmonary complications, among others, and they were willing to proceed. Questions answered     PROCEDURE IN DETAIL: The patient was identified by armband,  received preoperative [facility], taken to the operating room , appropriate anesthetic monitors  were attached and general endotracheal anesthesia induced. Foley catheter was inserted. Pt was rolled into the R lateral decubitus position and fixed there with a Stulberg Mark II pelvic clamp.  The L lower extremity was then prepped and draped  in  the usual sterile fashion from the ankle to the hemipelvis. A time-out  procedure was performed. The skin along the lateral hip and thigh  infiltrated with 10 mL of 0.5% Marcaine and epinephrine solution. We  then made a posterolateral approach to the hip. With a #10 blade, a 25 cm  incision was made through the skin and subcutaneous tissue down to the level of the  IT band. Small bleeders were identified and cauterized. The IT band was cut in  line with skin incision exposing the greater trochanter. A Cobra retractor was placed between the gluteus minimus and the superior hip joint capsule, and a spiked Cobra between the quadratus femoris and the inferior hip joint capsule. This isolated the short  external rotators and piriformis tendons. These were tagged with a #2 Ethibond  suture and cut off their insertion on the intertrochanteric crest. The posterior  capsule was then developed into an acetabular-based flap from Posterior Superior off of the acetabulum out over the femoral neck and back posterior inferior to the acetabular rim. This flap was tagged with two #2 Ethibond  sutures and retracted protecting the sciatic nerve. This exposed the arthritic femoral head and osteophytes. The hip was then flexed and internally rotated, dislocating the femoral head and a standard neck cut performed 1 fingerbreadth above the lesser trochanter.  A spiked Cobra was placed in the cotyloid notch and a Hohmann retractor was then used to lever the femur anteriorly off of the anterior pelvic column. A posterior-inferior wing retractor was placed at the junction of the acetabulum and the ischium completing the acetabular exposure.We then removed the peripheral osteophytes and labrum from the acetabulum. We then reamed the acetabulum up to 53 mm with basket reamers obtaining good coverage in all quadrants. We then irrigated with normal  saline solution and hammered into place a 54 mm Gryptioncup in 45  degrees of  abduction and about 20 degrees of anteversion.  A single 35mm dome screw was placed.  More  peripheral osteophytes removed and a trial 10-degree liner placed with the  index superior-posterior. The hip was then flexed and internally rotated exposing the  proximal femur, which was entered with the initiating reamer followed by  the axial reamers up to a 15.5 mm full depth and 16mm partial depth. We then conically reamed to 20D to the correct depth for a 42 base neck. The calcar was milled to 20Dsm. A trial cone and stem was inserted in the 25 degrees anteversion, with a +0 36mm trial head. Trial reduction was then performed and excellent stability was noted with at 90 of flexion with 60 of internal rotation and then full extension with maximal external rotation. The hip could not be dislocated in full extension. The knee could easily flex  to about 130 degrees. We also stretched the abductors at this point,  because of the preexisting adductor contractures. All trial components  were then removed. The acetabulum was irrigated out with normal saline  solution. A titanium [facility] Center[facility] Eliminator was then screwed into place  followed by a 10-degree polyethylene liner index superior-posterior. On  the femoral side a 20Dsm ZTT1 sleeve was hammered into place, followed by a 20x15x42x150 SROM stem in 25 degrees of anteversion. At this point, a +0 36 mm ceramic head was  hammered on the stem. The hip was reduced. We checked our stability  one more time and found it to be excellent. The wound was once again  thoroughly irrigated out with normal saline solution pulse lavage. The  capsular flap and short external rotators were repaired back to the  intertrochanteric crest through drill holes with a #2 Ethibond suture.  The IT band was closed with running 1 Vicryl suture. The subcutaneous  tissue with 0 and 2-0 undyed Vicryl suture and the skin with running  interlocking 3-0 nylon suture. Dressing of Xeroform and  Mepilex was  then applied. The patient was then unclamped, rolled supine, awaken extubated and taken to recovery room without difficulty in stable condition.   Gean BirchwoodOWAN,Jazleen Robeck J 04/19/2014, 3:45 PM

## 2014-04-19 NOTE — Interval H&P Note (Signed)
History and Physical Interval Note:  04/19/2014 12:37 PM  Luke [name]  has presented today for surgery, with the diagnosis of LEFT HIP CHARCOT JOINT  The various methods of treatment have been discussed with the patient and family. After consideration of risks, benefits and other options for treatment, the patient has consented to  Procedure(s): LEFT TOTAL HIP ARTHROPLASTY (Left) as a surgical intervention .  The patient's history has been reviewed, patient examined, no change in status, stable for surgery.  I have reviewed the patient's chart and labs.  Questions were answered to the patient's satisfaction.     Nestor LewandowskyOWAN,[name]

## 2014-04-19 NOTE — Transfer of Care (Signed)
Immediate Anesthesia Transfer of Care Note  Patient: Luke [name]  Procedure(s) Performed: Procedure(s): LEFT TOTAL HIP ARTHROPLASTY (Left)  Patient Location: PACU  Anesthesia Type:General  Level of Consciousness: awake, alert , oriented and sedated  Airway & Oxygen Therapy: Patient Spontanous Breathing and Patient connected to face mask oxygen  Post-op Assessment: Report given to PACU RN, Post -op Vital signs reviewed and stable and Patient moving all extremities  Post vital signs: Reviewed and stable  Complications: No apparent anesthesia complications

## 2014-04-20 LAB — CBC
HCT: 29.6 % — ABNORMAL LOW (ref 39.0–52.0)
Hemoglobin: 9.3 g/dL — ABNORMAL LOW (ref 13.0–17.0)
MCH: 25.5 pg — ABNORMAL LOW (ref 26.0–34.0)
MCHC: 31.4 g/dL (ref 30.0–36.0)
MCV: 81.1 fL (ref 78.0–100.0)
PLATELETS: 257 10*3/uL (ref 150–400)
RBC: 3.65 MIL/uL — ABNORMAL LOW (ref 4.22–5.81)
RDW: 17.3 % — AB (ref 11.5–15.5)
WBC: 8.5 10*3/uL (ref 4.0–10.5)

## 2014-04-20 LAB — BASIC METABOLIC PANEL
Anion gap: 12 (ref 5–15)
BUN: 12 mg/dL (ref 6–23)
CHLORIDE: 99 meq/L (ref 96–112)
CO2: 22 mEq/L (ref 19–32)
Calcium: 8 mg/dL — ABNORMAL LOW (ref 8.4–10.5)
Creatinine, Ser: 0.7 mg/dL (ref 0.50–1.35)
Glucose, Bld: 142 mg/dL — ABNORMAL HIGH (ref 70–99)
POTASSIUM: 4.8 meq/L (ref 3.7–5.3)
Sodium: 133 mEq/L — ABNORMAL LOW (ref 137–147)

## 2014-04-20 LAB — GLUCOSE, CAPILLARY
GLUCOSE-CAPILLARY: 144 mg/dL — AB (ref 70–99)
Glucose-Capillary: 132 mg/dL — ABNORMAL HIGH (ref 70–99)
Glucose-Capillary: 115 mg/dL — ABNORMAL HIGH (ref 70–99)
Glucose-Capillary: 112 mg/dL — ABNORMAL HIGH (ref 70–99)

## 2014-04-20 LAB — HEMOGLOBIN A1C
Hgb A1c MFr Bld: 6.6 % — ABNORMAL HIGH (ref <5.7)
MEAN PLASMA GLUCOSE: 143 mg/dL — AB (ref <117)

## 2014-04-20 MED ORDER — PNEUMOCOCCAL VAC POLYVALENT 25 MCG/0.5ML IJ INJ
0.5000 mL | INJECTION | INTRAMUSCULAR | Status: DC
  Filled 2014-04-20: qty 0.5

## 2014-04-20 NOTE — Evaluation (Addendum)
Occupational Therapy Evaluation Patient Details Name: Luke [name] MRN: [id number] DOB: [birth year] Today's Date: 04/20/2014    History of Present Illness 53 y.o. s/p left posterior THA.   Clinical Impression   Pt s/p above. Pt independent with ADLs, PTA. Pt planning to go to SNF for rehab-will defer all further OT needs to next venue of care.    Follow Up Recommendations  SNF    Equipment Recommendations  Other (comment) (long sponge)    Recommendations for Other Services       Precautions / Restrictions Precautions Precautions: Posterior Hip Precaution Comments: educated on precautions Restrictions Weight Bearing Restrictions: Yes LLE Weight Bearing: Weight bearing as tolerated      Mobility Bed Mobility Overal bed mobility: Needs Assistance Bed Mobility: Supine to Sit     Supine to sit: Mod assist     General bed mobility comments: assist with trunk and LLE. Cues for technique.  Transfers Overall transfer level: Needs assistance Equipment used: Rolling walker (2 wheeled) Transfers: Sit to/from Stand Sit to Stand: Mod assist;Min assist;Min guard         General transfer comment: Mod A for initial stand from bed. Min A for second sit to stand transfer from bed. Min guard for sit to stand from recliner chair. Educated on hand placement and positioning of LLE.    Balance                                            ADL Overall ADL's : Needs assistance/impaired     Grooming: Set up;Supervision/safety;Sitting   Upper Body Bathing: Set up;Supervision/ safety;Sitting   Lower Body Bathing: With adaptive equipment;Sit to/from stand;Min guard   Upper Body Dressing : Set up;Supervision/safety;Sitting   Lower Body Dressing: Minimal assistance;With adaptive equipment;Sit to/from stand   Toilet Transfer: Min guard;Ambulation;RW (chair/bed; Min guard-Mod A for sit to stand transfer)           Functional mobility during ADLs: Min  guard;Rolling walker General ADL Comments: Educated on LB dressing technique as well as use of AE/cost/where he can purchase. Educated on safety (safe shoewear, use of bag on walker, rugs, sitting for LB ADLs). educated on tub transfer techniques and options for shower chair. Discussed incorporating precautions into functional activities/mobility. Placed pillow between pt's legs at end of session. Pt's pain decreased after mobility. Explained use of elastic shoe laces.      Vision                     Perception     Praxis      Pertinent Vitals/Pain Pain Assessment: 0-10 Pain Score: 3  Pain Location: left hip Pain Descriptors / Indicators: Nagging;Sore Pain Intervention(s): Monitored during session;Repositioned     Hand Dominance     Extremity/Trunk Assessment Upper Extremity Assessment Upper Extremity Assessment: Overall WFL for tasks assessed   Lower Extremity Assessment Lower Extremity Assessment: Defer to PT evaluation   Cervical / Trunk Assessment Cervical / Trunk Assessment:  (stiffness in neck/back)   Communication Communication Communication: No difficulties   Cognition Arousal/Alertness: Awake/alert Behavior During Therapy: WFL for tasks assessed/performed Overall Cognitive Status: Within Functional Limits for tasks assessed                     General Comments       Exercises       Shoulder  Instructions      Home Living Family/patient expects to be discharged to:: Skilled nursing facility Living Arrangements: Other relatives (brother) Available Help at Discharge: Family;Available PRN/intermittently Type of Home: House Home Access: Stairs to enter Entergy CorporationEntrance Stairs-Number of Steps: 2 Entrance Stairs-Rails: None       Bathroom Shower/Tub: Chief Strategy OfficerTub/shower unit   Bathroom Toilet: Standard     Home Equipment: Environmental consultantWalker - 2 wheels;Bedside commode;Adaptive equipment Adaptive Equipment: Reacher;Sock aid;Long-handled shoe horn        Prior  Functioning/Environment Level of Independence: Independent with assistive device(s)        Comments: using walker    OT Diagnosis: Acute pain   OT Problem List: Decreased strength;Decreased range of motion;Pain;Decreased knowledge of precautions;Decreased knowledge of use of DME or AE;Decreased activity tolerance   OT Treatment/Interventions:      OT Goals(Current goals can be found in the care plan section) Acute Rehab OT Goals Patient Stated Goal: go to rehab  OT Frequency:     Barriers to D/C:            Co-evaluation              End of Session Equipment Utilized During Treatment: Gait belt;Rolling walker  Activity Tolerance: Patient tolerated treatment well Patient left: in chair;with call bell/phone within reach   Time: 0900-0925 OT Time Calculation (min): 25 min Charges:  OT General Charges $OT Visit: 1 Procedure OT Evaluation $Initial OT Evaluation Tier I: 1 Procedure OT Treatments $Self Care/Home Management : 8-22 mins G-CodesEarlie [name]:    Luke [name] Luke [name] 04/20/2014, 9:37 AM

## 2014-04-20 NOTE — Evaluation (Signed)
Physical Therapy Evaluation Patient Details Name: Luke [name] MRN: [id number] DOB: 1960/05/20 Today's Date: 04/20/2014   History of Present Illness  53 y.o. s/p left posterior THA.  Clinical Impression  Pt is s/p Lt THA POD#1 resulting in the deficits listed below (see PT Problem List).  Pt will benefit from skilled PT to increase their independence and safety with mobility to allow discharge to the venue listed below. Pt does not have 24/7 (A) at home. [facility] acute [facility].      Follow Up Recommendations SNF;Supervision/Assistance - 24 hour    Equipment Recommendations  Other (comment) (TBD at snf)    Recommendations for Other Services       Precautions / Restrictions Precautions Precautions: Posterior Hip Precaution Comments: pt able to independently recall 3/3 hip precautions Restrictions Weight Bearing Restrictions: Yes LLE Weight Bearing: Weight bearing as tolerated      Mobility  Bed Mobility Overal bed mobility: Needs Assistance Bed Mobility: Supine to Sit     Supine to sit: Mod assist     General bed mobility comments: up in chair and returned to chair  Transfers Overall transfer level: Needs assistance Equipment used: Rolling walker (2 wheeled) Transfers: Sit to/from Stand Sit to Stand: Min assist         General transfer comment: (A) to balance and transitioning UEs to RW; cues for hip precautions and hand placement   Ambulation/Gait Ambulation/Gait assistance: Min assist Ambulation Distance (Feet): 70 Feet Assistive device: Rolling walker (2 wheeled) Gait Pattern/deviations: Step-through pattern;Decreased step length - right;Decreased stance time - left;Wide base of support;Trunk flexed Gait velocity: decreased Gait velocity interpretation: Below normal speed for age/gender General Gait Details: cues for upright posture and step through gt sequencing; min (A) to balance and manage RW; cues for hip precautions    Stairs            Wheelchair Mobility    Modified Rankin (Stroke Patients Only)       Balance Overall balance assessment: Needs assistance Sitting-balance support: Feet supported;No upper extremity supported Sitting balance-Leahy Scale: Good     Standing balance support: During functional activity;Bilateral upper extremity supported Standing balance-Leahy Scale: Poor Standing balance comment: RW to balance                              Pertinent Vitals/Pain Pain Assessment: 0-10 Pain Score: 2  Pain Location: Lt hip Pain Descriptors / Indicators: Sore Pain Intervention(s): Monitored during session;Premedicated before session;Repositioned;Heat applied (heat pack for back)    Home Living Family/patient expects to be discharged to:: Skilled nursing facility Living Arrangements: Other relatives Available Help at Discharge: Family;Available PRN/intermittently Type of Home: House Home Access: Stairs to enter Entrance Stairs-Rails: None Entrance Stairs-Number of Steps: 2   Home Equipment: Walker - 2 wheels;Bedside commode;Adaptive equipment Additional Comments: brother is "in/out"; works during the day    Prior Function Level of Independence: Independent with assistive device(s)         Comments: using walker     Hand Dominance   Dominant Hand: Right    Extremity/Trunk Assessment   Upper Extremity Assessment: Defer to OT evaluation           Lower Extremity Assessment: LLE deficits/detail   LLE Deficits / Details: hip 3/5 - limited by pain; quad 3+/5  Cervical / Trunk Assessment: Normal  Communication   Communication: No difficulties  Cognition Arousal/Alertness: Awake/alert Behavior  During Therapy: WFL for tasks assessed/performed Overall Cognitive Status: Within Functional Limits for tasks assessed                      General Comments      Exercises Total Joint Exercises Ankle Circles/Pumps: AROM;Both;10  reps;Seated Quad Sets: AROM;Both;10 reps;Seated Gluteal Sets: 10 reps;Seated Hip ABduction/ADduction: AAROM;Left;10 reps;Seated Long Arc Quad: AROM;Left;10 reps;Seated      Assessment/Plan    PT Assessment Patient needs continued PT services  PT Diagnosis Difficulty walking;Acute pain;Generalized weakness   PT Problem List Decreased activity tolerance;Decreased range of motion;Decreased strength;Decreased balance;Decreased mobility;Decreased knowledge of use of DME;Decreased safety awareness;Pain;Decreased knowledge of precautions  PT Treatment Interventions DME instruction;Gait training;Functional mobility training;Therapeutic activities;Therapeutic exercise;Balance training;Neuromuscular re-education;Patient/family education   PT Goals (Current goals can be found in the Care Plan section) Acute Rehab PT Goals Patient Stated Goal: go to rehab PT Goal Formulation: With patient Potential to Achieve Goals: Good    Frequency 7X/week   Barriers to discharge Decreased caregiver support brother works during the day    Co-evaluation               End of Session Equipment Utilized During Treatment: Gait belt Activity Tolerance: Patient tolerated treatment well Patient left: in chair;with call bell/phone within reach Nurse Communication: Mobility status         Time: 1141-1157 PT Time Calculation (min) (ACUTE ONLY): 16 min   Charges:   PT Evaluation $Initial PT Evaluation Tier I: 1 Procedure PT Treatments $Gait Training: 8-22 mins   PT G CodesDonell Sievert:          Scarlet Abad N, South CarolinaPT  [phone] 04/20/2014, 12:40 PM

## 2014-04-20 NOTE — Progress Notes (Signed)
    Patient doing well 1 day PO L THR per Dr Turner Danielsowan. Minimal hip pain well controlled, has bene up and to the bathroom, awaiting formal PT. Plans to D/C to SNF. Pain well controlled on current regimen, good appetite, denies N/V/D/SOB/Ch px.   Physical Exam: BP 115/50 mmHg  Pulse 97  Temp(Src) 98.6 F (37 C) (Oral)  Resp 16  Ht 5\' 5"  (1.651 m)  Wt 119.013 kg (262 lb 6 oz)  BMI 43.66 kg/m2  SpO2 96%  Dressing in place, distal comp soft, SCD's in place, pt sitting up in recliner comfortably, NVI  POD #1 s/p L THR per Dr Turner Danielsowan, doing great  - ASA 2weeks  - up with PT/OT, encourage ambulation - Px well controlled cont current regimen - likely d/c to SNF pending bed availability

## 2014-04-21 LAB — CBC
HCT: 27.5 % — ABNORMAL LOW (ref 39.0–52.0)
Hemoglobin: 8.8 g/dL — ABNORMAL LOW (ref 13.0–17.0)
MCH: 26.6 pg (ref 26.0–34.0)
MCHC: 32 g/dL (ref 30.0–36.0)
MCV: 83.1 fL (ref 78.0–100.0)
PLATELETS: 208 10*3/uL (ref 150–400)
RBC: 3.31 MIL/uL — AB (ref 4.22–5.81)
RDW: 17.8 % — AB (ref 11.5–15.5)
WBC: 8.2 10*3/uL (ref 4.0–10.5)

## 2014-04-21 LAB — GLUCOSE, CAPILLARY
Glucose-Capillary: 116 mg/dL — ABNORMAL HIGH (ref 70–99)
Glucose-Capillary: 114 mg/dL — ABNORMAL HIGH (ref 70–99)
Glucose-Capillary: 88 mg/dL (ref 70–99)
Glucose-Capillary: 107 mg/dL — ABNORMAL HIGH (ref 70–99)

## 2014-04-21 NOTE — Progress Notes (Signed)
Utilization Review Completed.Delvon Chipps T12/20/2015  

## 2014-04-21 NOTE — Clinical Social Work Placement (Addendum)
    Clinical Social Work Department CLINICAL SOCIAL WORK PLACEMENT NOTE 04/21/2014  Patient:  Luke [name],[name]  Account Number:  [id number] Admit date:  04/19/2014  Clinical Social Worker:  Harless NakayamaPOONUM AMBELAL, LCSWA  Date/time:  04/21/2014 03:34 PM  Clinical Social Work is seeking post-discharge placement for this patient at the following level of care:   SKILLED NURSING   (*CSW will update this form in Epic as items are completed)   04/21/2014  Patient/family provided with [facility] Department of Clinical Social Work's list of facilities offering this level of care within the geographic area requested by the patient (or if unable, by the patient's family).  04/21/2014  Patient/family informed of their freedom to choose among providers that offer the needed level of care, that participate in Medicare, Medicaid or managed care program needed by the patient, have an available bed and are willing to accept the patient.  04/21/2014  Patient/family informed of [facility]' ownership interest in [facility], as well as of the fact that they are under no obligation to receive care at this facility.  PASARR submitted to EDS on existing PASARR number received on   FL2 transmitted to all facilities in geographic area requested by pt/family on  04/21/2014 FL2 transmitted to all facilities within larger geographic area on 04/21/2014  Patient informed that his/her managed care company has contracts with or will negotiate with  certain facilities, including the following:     Patient/family informed of bed offers received:  04/22/2014 Marcelline Deist(Lynisha Osuch, Theresia MajorsLCSWA) [facility] Marcelline Deist(Tykel Badie, [place]) Physician recommends and patient chooses bed at    [facility] be [facility] on  04/22/2014 Marcelline Deist(Carmalita Wakefield, [facility]) Patient to be transferred to facility by [facility] Marcelline Deist(Edmund Rick, LCSWA) Patient and family notified of transfer on 04/22/2014  Marcelline Deist(Davan Hark, Theresia MajorsLCSWA) Name of family member notified: Pt updated at bedside Lily Kocher(Hyman Crossan, LCSWA)  The following physician request were entered in Epic: Physician Request  Please sign FL2.    Additional Comments:    Harless Nakayamaoonum Ambelal, LCSWA Weekend CSW [phone]  Marcelline Deistmily Faustine Tates, [place] [phone]) Licensed Clinical Social Worker Orthopedics [phone]([id number]) and Surgical [phone]([id number])

## 2014-04-21 NOTE — Clinical Social Work Psychosocial (Signed)
     Clinical Social Work Department BRIEF PSYCHOSOCIAL ASSESSMENT 04/21/2014  Patient:  Luke [name],Omega C     Account Number:  [id number]     Admit date:  04/19/2014  Clinical Social Worker:  Harless NakayamaAMBELAL,Yehia Mcbain, LCSWA  Date/Time:  04/21/2014 02:59 PM  Referred by:  Physician  Date Referred:  04/21/2014 Referred for  SNF Placement   Other Referral:   Interview type:  Patient Other interview type:    PSYCHOSOCIAL DATA Living Status:  ALONE Admitted from facility:   Level of care:   Primary support name:  Kevan NyMelony Graves Primary support relationship to patient:  SIBLING Degree of support available:   Pt has good support    CURRENT CONCERNS Current Concerns  Post-Acute Placement   Other Concerns:    SOCIAL WORK ASSESSMENT / PLAN CSW visited pt room and discussed SNF. Pt informed CSW he had been informed by MD office that plan would be [facility]. CSW explained SNF referral process. Pt explained that he would want to dc to facility that MD had recommended. CSW explained that CSW would attempt to make this work. CSW did inform pt that at this time only anticipated issue may be with insurance. CSW explained authorization process to pt. Pt understanding and agreeable to doing whatever necessary. CSW did notify pt that [facility] may be dc facility if insurance authorization is not received and pt is ready for dc. Pt once again confirmed he would go there if necessary.   Assessment/plan status:  Psychosocial Support/Ongoing Assessment of Needs Other assessment/ plan:   Information/referral to community resources:   SNF list denied    PATIENTS/FAMILYS RESPONSE TO PLAN OF CARE: Pt is agreeable to dc to SNF.      Larone Kliethermes Lajean Savermbelal, LCSWA  Weekend CSW  [phone]

## 2014-04-21 NOTE — Progress Notes (Signed)
    Patient doing well 2 days PO L THR per Dr Turner Danielsowan. Minimal hip pain well controlled, has bene up and to the bathroom, good gains in PT yesterday. Plans to D/C to SNF. Pain well controlled on current regimen, good appetite, denies N/V/D/SOB/Ch px.  Physical Exam: BP 106/61 mmHg  Pulse 85  Temp(Src) 98.7 F (37.1 C) (Oral)  Resp 18  Ht 5\' 5"  (1.651 m)  Wt 119.013 kg (262 lb 6 oz)  BMI 43.66 kg/m2  SpO2 97%  Dressing in place, distal comp soft, SCD's in place, pt sitting up in recliner comfortably, NVI  POD #2 s/p L THR per Dr Turner Danielsowan, doing great  - ASA 2weeks  - up with PT/OT, encourage ambulation - Px well controlled cont current regimen - likely d/c to SNF pending bed availability

## 2014-04-21 NOTE — Progress Notes (Signed)
Physical Therapy Treatment Patient Details Name: Luke [name] MRN: [id number] DOB: 1960-08-11 Today's Date: 04/21/2014    History of Present Illness 53 y.o. s/p left posterior THA.    PT Comments    Pt very pleasant and motivated to progress with therapy. Pt able to progress mobility distance with min (A) to manage RW. Cues for posterior hip precautions with mobility. Cont to recommend SNF due to lack of caregiver (A) at home.   Follow Up Recommendations  SNF;Supervision/Assistance - 24 hour     Equipment Recommendations  Other (comment)    Recommendations for Other Services       Precautions / Restrictions Precautions Precautions: Posterior Hip Precaution Comments: pt able to recall 3/3 precautions; reviewed during session  Restrictions Weight Bearing Restrictions: Yes LLE Weight Bearing: Weight bearing as tolerated    Mobility  Bed Mobility Overal bed mobility: Needs Assistance Bed Mobility: Supine to Sit;Sit to Supine     Supine to sit: Min assist;HOB elevated Sit to supine: Min assist   General bed mobility comments: (A) to advance Lt off EOB and back into supine position. cues for sequencing   Transfers Overall transfer level: Needs assistance Equipment used: Rolling walker (2 wheeled) Transfers: Sit to/from Stand Sit to Stand: Min assist         General transfer comment: (A) to power up and balance ; cues for hip precautions and  hand placement   Ambulation/Gait Ambulation/Gait assistance: Min assist Ambulation Distance (Feet): 100 Feet Assistive device: Rolling walker (2 wheeled) Gait Pattern/deviations: Step-through pattern;Decreased stance time - left;Decreased step length - right;Antalgic;Wide base of support;Trunk flexed Gait velocity: decreased Gait velocity interpretation: Below normal speed for age/gender General Gait Details: cues for step through gt sequencing and upright posture; min (A) to balance and manage RW    Stairs             Wheelchair Mobility    Modified Rankin (Stroke Patients Only)       Balance Overall balance assessment: Needs assistance Sitting-balance support: Feet supported;No upper extremity supported Sitting balance-Leahy Scale: Good     Standing balance support: During functional activity;Bilateral upper extremity supported Standing balance-Leahy Scale: Poor Standing balance comment: RW to balance                     Cognition Arousal/Alertness: Awake/alert Behavior During Therapy: WFL for tasks assessed/performed Overall Cognitive Status: Within Functional Limits for tasks assessed                      Exercises Total Joint Exercises Ankle Circles/Pumps: AROM;Both;10 reps;Seated Quad Sets: AROM;Both;10 reps;Supine Gluteal Sets: 10 reps;Seated Heel Slides: AAROM;Left;10 reps;Supine Hip ABduction/ADduction: AAROM;Left;10 reps;Supine    General Comments        Pertinent Vitals/Pain Pain Assessment: 0-10 Pain Score: 6  Pain Location: Lt hip Pain Descriptors / Indicators: Aching;Sore Pain Intervention(s): Monitored during session;Premedicated before session;Repositioned    Home Living                      Prior Function            PT Goals (current goals can now be found in the care plan section) Acute Rehab PT Goals Patient Stated Goal: to go to rehab tomorrow PT Goal Formulation: With patient Potential to Achieve Goals: Good Progress towards PT goals: Progressing toward goals    Frequency  7X/week    PT Plan Current plan remains appropriate    Co-evaluation  End of Session Equipment Utilized During Treatment: Gait belt Activity Tolerance: Patient tolerated treatment well Patient left: in bed;with call bell/phone within reach     Time: 1425-1449 PT Time Calculation (min) (ACUTE ONLY): 24 min  Charges:  $Gait Training: 8-22 mins $Therapeutic Exercise: 8-22 mins                    G CodesDonell Sievert:      Camarion Weier  N, [place]PT [phone] 04/21/2014, 4:32 PM

## 2014-04-22 LAB — GLUCOSE, CAPILLARY
GLUCOSE-CAPILLARY: 105 mg/dL — AB (ref 70–99)
Glucose-Capillary: 132 mg/dL — ABNORMAL HIGH (ref 70–99)
Glucose-Capillary: 86 mg/dL (ref 70–99)

## 2014-04-22 LAB — CBC
HEMATOCRIT: 27.3 % — AB (ref 39.0–52.0)
HEMOGLOBIN: 8.5 g/dL — AB (ref 13.0–17.0)
MCH: 25.4 pg — ABNORMAL LOW (ref 26.0–34.0)
MCHC: 31.1 g/dL (ref 30.0–36.0)
MCV: 81.5 fL (ref 78.0–100.0)
Platelets: 236 10*3/uL (ref 150–400)
RBC: 3.35 MIL/uL — ABNORMAL LOW (ref 4.22–5.81)
RDW: 17.3 % — ABNORMAL HIGH (ref 11.5–15.5)
WBC: 8.4 10*3/uL (ref 4.0–10.5)

## 2014-04-22 NOTE — Progress Notes (Signed)
PATIENT ID: Luke [name]  MRN: [id number]  DOB/AGE:  [birth year] / 53 y.o.  3 Days Post-Op Procedure(s) (LRB): LEFT TOTAL HIP ARTHROPLASTY (Left)    PROGRESS NOTE Subjective: Patient is alert, oriented, no Nausea, no Vomiting, yes passing gas, no Bowel Movement. Taking PO well. Denies SOB, Chest or Calf Pain. Using Incentive Spirometer, PAS in place. Ambulate WBAT with pt walking 100 ft with therapy. Patient reports pain as 2 on 0-10 scale  .    Objective: Vital signs in last 24 hours: Filed Vitals:   04/21/14 0800 04/21/14 1335 04/21/14 2005 04/22/14 0533  BP:  104/56 109/46 113/56  Pulse:  113 101 103  Temp:  98.9 F (37.2 C) 99.5 F (37.5 C) 99 F (37.2 C)  TempSrc:  Oral Oral Oral  Resp: 18 18 16 18   Height:      Weight:      SpO2: 97% 100% 100% 95%      Intake/Output from previous day: I/O last 3 completed shifts: In: 1040 [P.O.:1040] Out: 2350 [Urine:2350]   Intake/Output this shift:     LABORATORY DATA:  Recent Labs  04/20/14 0554  04/21/14 0410  04/21/14 1647 04/21/14 2136 04/22/14 0528 04/22/14 0628  WBC 8.5  --  8.2  --   --   --  8.4  --   HGB 9.3*  --  8.8*  --   --   --  8.5*  --   HCT 29.6*  --  27.5*  --   --   --  27.3*  --   PLT 257  --  208  --   --   --  236  --   NA 133*  --   --   --   --   --   --   --   K 4.8  --   --   --   --   --   --   --   CL 99  --   --   --   --   --   --   --   CO2 22  --   --   --   --   --   --   --   BUN 12  --   --   --   --   --   --   --   CREATININE 0.70  --   --   --   --   --   --   --   GLUCOSE 142*  --   --   --   --   --   --   --   GLUCAP  --   < >  --   < > 88 107*  --  132*  CALCIUM 8.0*  --   --   --   --   --   --   --   < > = values in this interval not displayed.  Examination: Neurologically intact Neurovascular intact Sensation intact distally Intact pulses distally Dorsiflexion/Plantar flexion intact Incision: scant drainage No cellulitis present Compartment soft} XR AP&Lat of hip  shows well placed\fixed THA  Assessment:   3 Days Post-Op Procedure(s) (LRB): LEFT TOTAL HIP ARTHROPLASTY (Left) ADDITIONAL DIAGNOSIS:  Expected Acute Blood Loss Anemia, Diabetes  Plan: PT/OT WBAT, THA  posterior precautions  DVT Prophylaxis: SCDx72 hrs, ASA 325 mg BID x 2 weeks  DISCHARGE PLAN: Skilled Nursing Facility/Rehab, when bed available  DISCHARGE NEEDS: HHPT, HHRN, Walker  and 3-in-1 comode seat

## 2014-04-22 NOTE — Progress Notes (Signed)
CARE MANAGEMENT NOTE 04/22/2014  Patient:  Luke [name],Luke [name]   Account Number:  [id number]  Date Initiated:  04/22/2014  Documentation initiated by:  Luke [name],Luke [name]  Subjective/Objective Assessment:   s/p left THA     Action/Plan:   Pt/Ot evals- recommended SNF   Anticipated DC Date:  04/22/2014   Anticipated DC Plan:  SKILLED NURSING FACILITY  In-house referral  Clinical Social Worker      DC Planning Services  CM consult      Choice offered to / List presented to:             Status of service:  Completed, signed off Medicare Important Message given?   (If response is "NO", the following Medicare IM given date fields will be blank) Date Medicare IM given:   Medicare IM given by:   Date Additional Medicare IM given:   Additional Medicare IM given by:    Discharge Disposition:  SKILLED NURSING FACILITY  Per UR Regulation:  Reviewed for med. necessity/level of care/duration of stay  If discussed at Long Length of Stay Meetings, dates discussed:    Comments:  04/22/14 Set up with Luke [name] for HHPT by MD office but d/[name] plan has changed to SNF. CSW working on placement. CM will continue to follow.

## 2014-04-22 NOTE — Discharge Summary (Signed)
Patient ID: Luke LocksMelvin C Lightcap MRN: [id number] DOB/AGE: [birth year] 53 y.o.  Admit date: 04/19/2014 Discharge date: 04/22/2014  Admission Diagnoses:  Active Problems:   Arthritis of left hip   Discharge Diagnoses:  Same  Past Medical History  Diagnosis Date  . DM (diabetes mellitus)   . Unintentional weight loss 10/16/2012  . DM (diabetes mellitus) type 2, uncontrolled, with ketoacidosis 10/16/2012    DKA  . Gout     Question diagnosis  . Protein-calorie malnutrition, severe 10/20/2012  . Cervical stenosis of spine 10/20/2012    Mild per CT  . Right inguinal hernia 10/20/2012    Large, without obstruction  . Cholelithiasis 10/20/2012  . 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 Chronic inflammatory arthritis 10/21/2012  . Pain, joint, multiple sites   . Pneumonia     Surgeries: Procedure(s): LEFT TOTAL HIP ARTHROPLASTY on 04/19/2014   Consultants:    Discharged Condition: Improved  Hospital Course: Luke [name] is an 53 y.o. male who was admitted 04/19/2014 for operative treatment of<principal problem not specified>. Patient has severe unremitting pain that affects sleep, daily activities, and work/hobbies. After pre-op clearance the patient was taken to the operating room on 04/19/2014 and underwent  Procedure(s): LEFT TOTAL HIP ARTHROPLASTY.    Patient was given perioperative antibiotics: Anti-infectives    Start     Dose/Rate Route Frequency Ordered Stop   04/19/14 0600  ceFAZolin (ANCEF) IVPB 2 g/50 mL premix     2 g100 mL/hr over 30 Minutes Intravenous On call to O.R. 04/18/14 1410 04/19/14 1400       Patient was given sequential compression devices, early ambulation, and chemoprophylaxis to prevent DVT.  Patient benefited maximally from hospital stay and there were no complications.    Recent vital signs: Patient Vitals for the past 24 hrs:  BP Temp Temp src Pulse Resp SpO2  04/22/14 0533 (!) 113/56 mmHg 99 F (37.2 C) Oral (!) 103 18 95 %   04/21/14 2005 (!) 109/46 mmHg 99.5 F (37.5 C) Oral (!) 101 16 100 %  04/21/14 1335 (!) 104/56 mmHg 98.9 F (37.2 C) Oral (!) 113 18 100 %     Recent laboratory studies:  Recent Labs  04/20/14 0554 04/21/14 0410 04/22/14 0528  WBC 8.5 8.2 8.4  HGB 9.3* 8.8* 8.5*  HCT 29.6* 27.5* 27.3*  PLT 257 208 236  NA 133*  --   --   K 4.8  --   --   CL 99  --   --   CO2 22  --   --   BUN 12  --   --   CREATININE 0.70  --   --   GLUCOSE 142*  --   --   CALCIUM 8.0*  --   --      Discharge Medications:     Medication List    TAKE these medications        acetaminophen 650 MG CR tablet  Commonly known as:  TYLENOL  Take 650 mg by mouth as needed for pain.     aspirin EC 325 MG tablet  Take 1 tablet (325 mg total) by mouth 2 (two) times daily.     diphenhydrAMINE 25 MG tablet  Commonly known as:  BENADRYL  Take 25-50 mg by mouth daily as needed for itching or allergies.     gabapentin 300 MG capsule  Commonly known as:  NEURONTIN  Take 300 mg by mouth 2 (two) times daily.  lisinopril 2.5 MG tablet  Commonly known as:  PRINIVIL,ZESTRIL  Take 2.5 mg by mouth daily.     metFORMIN 500 MG tablet  Commonly known as:  GLUCOPHAGE  Take 500 mg by mouth 2 (two) times daily with a meal.     methocarbamol 500 MG tablet  Commonly known as:  ROBAXIN  Take 1 tablet (500 mg total) by mouth 2 (two) times daily with a meal.     oxyCODONE-acetaminophen 5-325 MG per tablet  Commonly known as:  ROXICET  Take 1 tablet by mouth every 4 (four) hours as needed.        Diagnostic Studies: Dg Chest 2 View  04/11/2014   CLINICAL DATA:  Preop left hip replacement 04/19/2014  EXAM: CHEST  2 VIEW  COMPARISON:  None.  FINDINGS: The heart size and mediastinal contours are within normal limits. Both lungs are clear. The visualized skeletal structures are unremarkable.  IMPRESSION: No active cardiopulmonary disease.   Electronically Signed   By: Elige KoHetal  Patel   On: 04/11/2014 16:23   Dg  Pelvis Portable  04/19/2014   CLINICAL DATA:  Preoperative evaluation.  EXAM: PORTABLE PELVIS 1-2 VIEWS  COMPARISON:  01/20/2013  FINDINGS: Limited portable evaluation. Patient status post left hip arthroplasty. Gas within the overlying soft tissues. Grossly appropriate position of the hardware on single AP view.  IMPRESSION: Postoperative changes compatible with left hip arthroplasty.   Electronically Signed   By: Annia Beltrew  Davis M.D.   On: 04/19/2014 17:57   Nm Myocar Multi W/spect W/wall Motion / Ef  04/17/2014   CLINICAL DATA:  53 year old male undergoing preoperative cardiac evaluation for hip replacement surgery with history of diabetes mellitus and tobacco use, as well as an abnormal ECG, referred for an ischemic evaluation.  EXAM: MYOCARDIAL IMAGING WITH SPECT (REST AND PHARMACOLOGIC-STRESS)  GATED LEFT VENTRICULAR WALL MOTION STUDY  LEFT VENTRICULAR EJECTION FRACTION  TECHNIQUE: Standard myocardial SPECT imaging was performed after resting intravenous injection of 10 mCi Tc-3793m sestamibi. Subsequently, intravenous infusion of Lexiscan was performed under the supervision of the Cardiology staff. At peak effect of the drug, 30 mCi Tc-2793m sestamibi was injected intravenously and standard myocardial SPECT imaging was performed. Quantitative gated imaging was also performed to evaluate left ventricular wall motion, and estimate left ventricular ejection fraction.  COMPARISON:  None.  FINDINGS: Stress/ECG data: The patient was stressed according to the Lexiscan protocol. The resting heart rate of 75 beats per min rose to a maximal heart rate of 109 beats per min. The blood pressure ranged from 119/65 up to 136/65. No chest pain was reported.  Resting ECG demonstrated normal sinus rhythm with possible old anteroseptal infarct. There were no ischemic ST segment or T-wave abnormalities, nor any arrhythmias with stress.  Perfusion: No decreased activity in the left ventricle on stress imaging to suggest reversible  ischemia or infarction. Small, mild, fixed inferior wall defect extending from the apex to base, worse on rest than stress images, with normal regional wall motion, suggestive of inferior wall soft tissue attenuation artifact.  Wall Motion: Normal left ventricular wall motion. No left ventricular dilation.  Left Ventricular Ejection Fraction: 63 %  End diastolic volume 99 ml  End systolic volume 37 ml  IMPRESSION: 1. No reversible ischemia or infarction. Inferior wall soft tissue attenuation artifact noted.  2. Normal left ventricular wall motion.  3. Left ventricular ejection fraction 63%  4. Low-risk stress test findings*.  *2012 Appropriate Use Criteria for Coronary Revascularization Focused Update: J Am Coll Cardiol. 2012;59(9):857-881.  http://content.dementiazones.com.aspx?articleid=[email or web address]   Electronically Signed   By: Prentice Docker   On: 04/17/2014 14:18    Disposition: 03-Skilled Nursing Facility      Discharge Instructions    Call MD / Call 911    Complete by:  As directed   If you experience chest pain or shortness of breath, CALL 911 and be transported to the hospital emergency room.  If you develope a fever above 101 F, pus (white drainage) or increased drainage or redness at the wound, or calf pain, call your surgeon's office.     Change dressing    Complete by:  As directed   You may change your dressing on day 5, then change the dressing daily with sterile 4 x 4 inch gauze dressing and paper tape.  You may clean the incision with alcohol prior to redressing     Constipation Prevention    Complete by:  As directed   Drink plenty of fluids.  Prune juice may be helpful.  You may use a stool softener, such as Colace (over the counter) 100 mg twice a day.  Use MiraLax (over the counter) for constipation as needed.     Diet - low sodium heart healthy    Complete by:  As directed      Discharge instructions    Complete by:  As directed   Follow up in office with Dr. Turner Daniels in 2  weeks.     Driving restrictions    Complete by:  As directed   No driving for 2 weeks     Follow the hip precautions as taught in Physical Therapy    Complete by:  As directed      Increase activity slowly as tolerated    Complete by:  As directed      Patient may shower    Complete by:  As directed   You may shower without a dressing once there is no drainage.  Do not wash over the wound.  If drainage remains, cover wound with plastic wrap and then shower.           Follow-up Information    Follow up with Nestor Lewandowsky, MD In 2 weeks.   Specialty:  Orthopedic Surgery   Contact information:   [address] [place] [address] [phone]        Signed: Vear Clock Morganna Styles R 04/22/2014, 8:11 AM

## 2014-04-22 NOTE — Clinical Social Work Note (Signed)
[facility]. Pt updated at bedside.  [facility] Report number: [phone] [address]  Marcelline Deistmily Dashel Goines, LCSWA [phone]) Licensed Clinical Social Worker Orthopedics [phone]([id number]) and Surgical [phone]([id number])

## 2014-04-22 NOTE — Progress Notes (Signed)
Patient discharged to SNF.

## 2014-04-22 NOTE — Progress Notes (Signed)
Physical Therapy Treatment Patient Details Name: Luke [name] MRN: [id number] DOB: 1961-02-19 Today's Date: 04/22/2014    History of Present Illness 53 y.o. s/p left posterior THA.    PT Comments    Pt is progressing quickly with his mobility.  He is supervision overall today except for stairs (and bed mobility was not assessed at pt was sitting up in the recliner chair).  He will need to be at a mod I level with all mobility to return home safely as he lives alone and has intermittent family support.  PT will continue to follow acutely until d/c.  Follow Up Recommendations  SNF     Equipment Recommendations  None recommended by PT    Recommendations for Other Services   NA     Precautions / Restrictions Precautions Precautions: Posterior Hip Precaution Comments: Pt able to report 3/3 precautions when asked.  Restrictions LLE Weight Bearing: Weight bearing as tolerated    Mobility  Transfers Overall transfer level: Needs assistance Equipment used: Rolling walker (2 wheeled) Transfers: Sit to/from Stand Sit to Stand: Supervision         General transfer comment: supervision for safety as pt relies heavily on arms for transitons and has uncontrolled descent to sit.   Ambulation/Gait Ambulation/Gait assistance: Supervision Ambulation Distance (Feet): 100 Feet Assistive device: Rolling walker (2 wheeled) Gait Pattern/deviations: Step-through pattern;Ataxic;Trunk flexed Gait velocity: decreased Gait velocity interpretation: Below normal speed for age/gender General Gait Details: cues for upright posture (but pt limited by neck arthritis which has left him with cervical flexion deformity.    Stairs Stairs: Yes Stairs assistance: Min assist Stair Management: Two rails;Step to pattern;Forwards Number of Stairs: 5 General stair comments:  verbal cues for correct LE sequencing and min assist to support pt as he WB through his left leg to step up on his right and down  on his left.          Balance Overall balance assessment: Needs assistance Sitting-balance support: Feet supported;No upper extremity supported Sitting balance-Leahy Scale: Good     Standing balance support: Bilateral upper extremity supported Standing balance-Leahy Scale: Poor                      Cognition Arousal/Alertness: Awake/alert Behavior During Therapy: WFL for tasks assessed/performed Overall Cognitive Status: Within Functional Limits for tasks assessed                      Exercises Total Joint Exercises Ankle Circles/Pumps: AROM;Both;20 reps;Supine Quad Sets: AROM;Left;10 reps;Supine Short Arc Quad: AROM;Left;10 reps;Supine Heel Slides: AROM;Left;10 reps;Supine Hip ABduction/ADduction: AAROM;Left;10 reps;Supine;Standing Long Arc Quad: AROM;Left;10 reps;Supine Knee Flexion: AROM;Left;10 reps;Standing Marching in Standing: AROM;Left;10 reps;Standing Standing Hip Extension: AROM;Left;10 reps;Standing        Pertinent Vitals/Pain Pain Assessment: 0-10 Pain Score: 4  Pain Location: left hip Pain Descriptors / Indicators: Aching Pain Intervention(s): Limited activity within patient's tolerance;Monitored during session;Repositioned           PT Goals (current goals can now be found in the care plan section) Acute Rehab PT Goals Patient Stated Goal: to go to rehab Progress towards PT goals: Progressing toward goals    Frequency  7X/week    PT Plan Current plan remains appropriate       End of Session Equipment Utilized During Treatment: Gait belt Activity Tolerance: Patient tolerated treatment well Patient left: in chair;with call bell/phone within reach     Time: 1345-1417 PT Time Calculation (min) (ACUTE ONLY):  32 min  Charges:  $Gait Training: 8-22 mins $Therapeutic Exercise: 8-22 mins                      Clare Fennimore B. Nathian Stencil, PT, DPT [phone]   04/22/2014, 2:25 PM

## 2014-04-23 ENCOUNTER — Encounter (HOSPITAL_COMMUNITY): Payer: Self-pay | Admitting: Orthopedic Surgery

## 2014-04-23 LAB — TISSUE CULTURE
Culture: NO GROWTH
Gram Stain: NONE SEEN

## 2014-04-24 LAB — ANAEROBIC CULTURE

## 2014-04-29 NOTE — Anesthesia Postprocedure Evaluation (Signed)
Anesthesia Post Note  Patient: Luke [name]  Procedure(s) Performed: Procedure(s) (LRB): LEFT TOTAL HIP ARTHROPLASTY (Left)  Anesthesia type: General  Patient location: PACU  Post pain: Pain level controlled and Adequate analgesia  Post assessment: Post-op Vital signs reviewed, Patient's Cardiovascular Status Stable, Respiratory Function Stable, Patent Airway and Pain level controlled  Last Vitals:  Filed Vitals:   04/22/14 0533  BP: 113/56  Pulse: 103  Temp: 37.2 C  Resp: 18    Post vital signs: Reviewed and stable  Level of consciousness: awake, alert  and oriented  Complications: No apparent anesthesia complications

## 2014-06-01 LAB — AFB CULTURE WITH SMEAR (NOT AT ARMC): Acid Fast Smear: NONE SEEN

## 2014-06-07 IMAGING — DX DG THORACIC SPINE 3V
1 series · 1 of 1 positions shown · non-contrast
Comparison: CT chest 10/16/2047

CLINICAL DATA: Thoracic spine pain

THORACIC SPINE - 2 VIEW + SWIMMERS

[ap]
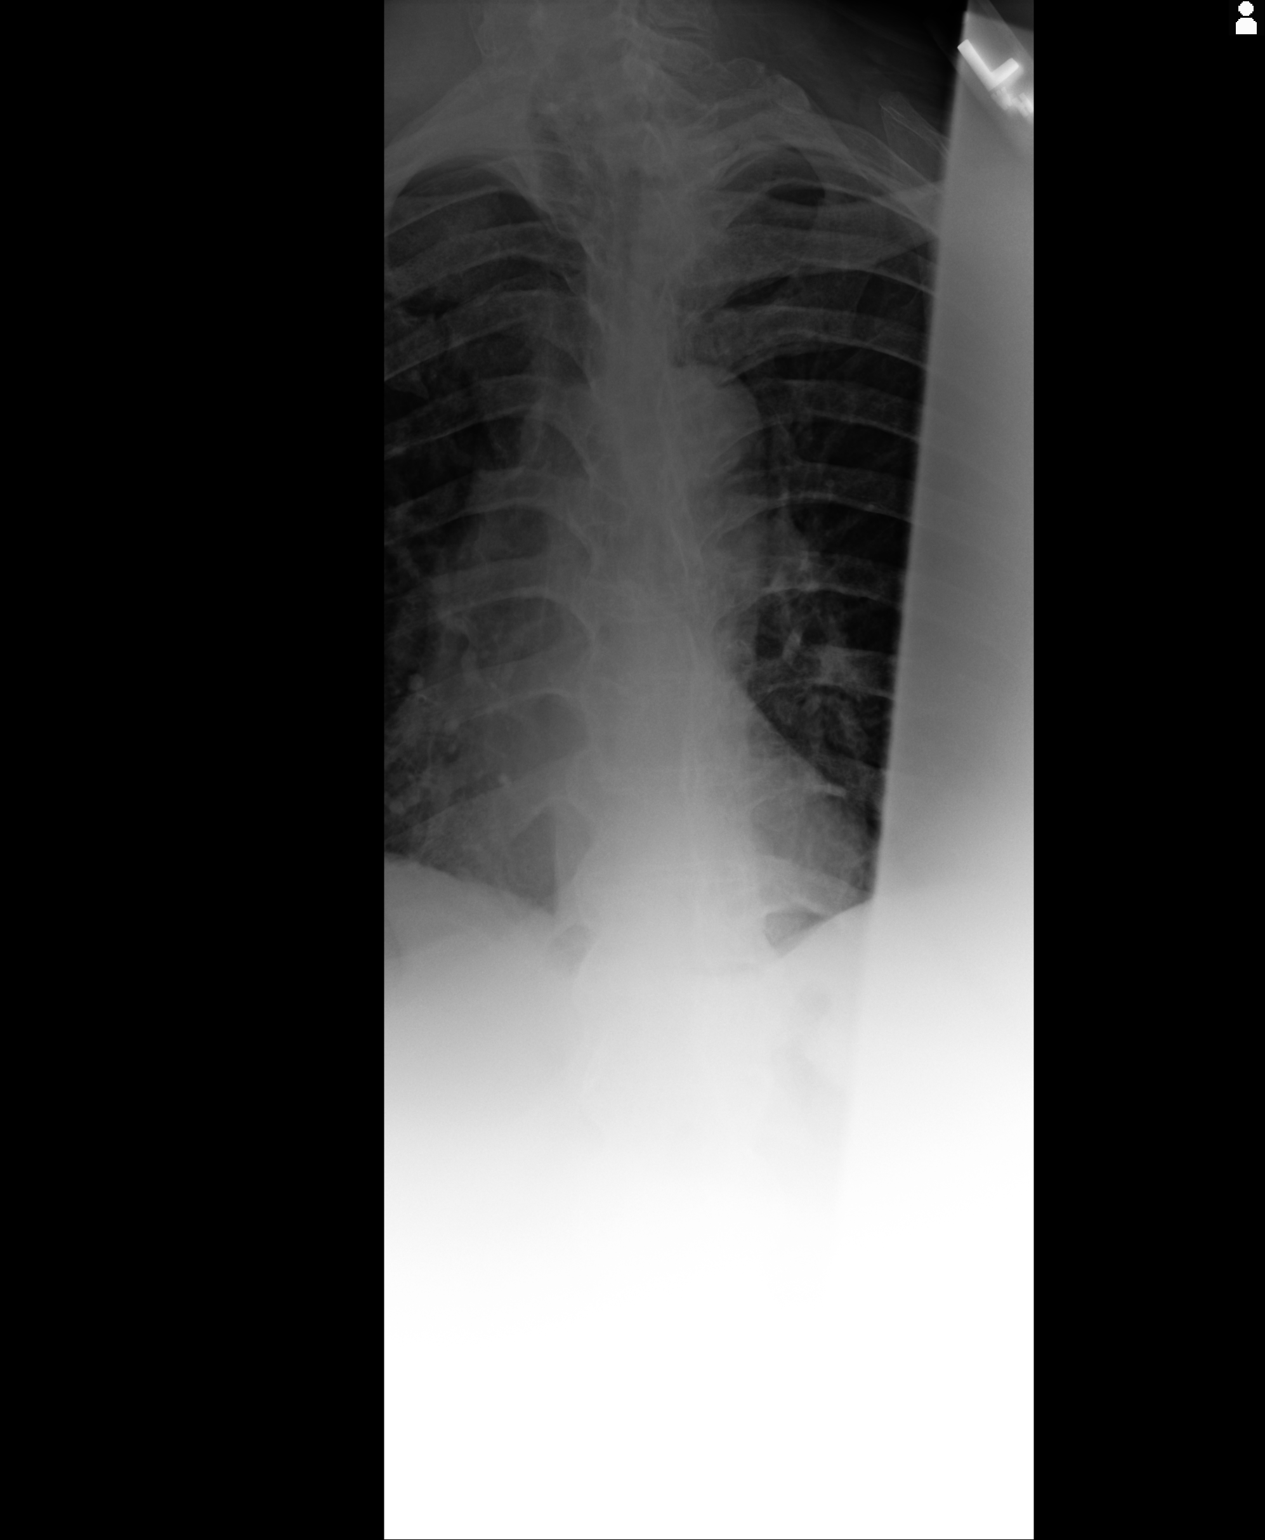

[1 of 1 positions shown; findings below may reference images not displayed]

FINDINGS: Mild scattered endplate spur formation.
Diffuse thoracic ankylosis and calcification of the anterior
longitudinal ligament. Vertebral body heights maintained without
fracture or subluxation.
No bone destruction or widening paraspinal lines.
Visualized posterior ribs unremarkable.
IMPRESSION: Diffuse thoracic spine ankylosis question ankylosing spondylitis.

## 2015-05-23 DIAGNOSIS — E782 Mixed hyperlipidemia: Secondary | ICD-10-CM | POA: Diagnosis not present

## 2015-05-23 DIAGNOSIS — I639 Cerebral infarction, unspecified: Secondary | ICD-10-CM | POA: Diagnosis not present

## 2015-05-23 DIAGNOSIS — I10 Essential (primary) hypertension: Secondary | ICD-10-CM | POA: Diagnosis not present

## 2015-06-02 DIAGNOSIS — I10 Essential (primary) hypertension: Secondary | ICD-10-CM | POA: Diagnosis not present

## 2015-06-02 DIAGNOSIS — E669 Obesity, unspecified: Secondary | ICD-10-CM | POA: Diagnosis not present

## 2015-06-02 DIAGNOSIS — E119 Type 2 diabetes mellitus without complications: Secondary | ICD-10-CM | POA: Diagnosis not present

## 2015-06-07 DIAGNOSIS — Z96641 Presence of right artificial hip joint: Secondary | ICD-10-CM | POA: Diagnosis not present

## 2015-06-07 DIAGNOSIS — Z471 Aftercare following joint replacement surgery: Secondary | ICD-10-CM | POA: Diagnosis not present

## 2015-07-28 DIAGNOSIS — E119 Type 2 diabetes mellitus without complications: Secondary | ICD-10-CM | POA: Diagnosis not present

## 2015-07-28 DIAGNOSIS — Z6841 Body Mass Index (BMI) 40.0 and over, adult: Secondary | ICD-10-CM | POA: Diagnosis not present

## 2015-07-28 DIAGNOSIS — E669 Obesity, unspecified: Secondary | ICD-10-CM | POA: Diagnosis not present

## 2015-09-22 DIAGNOSIS — J181 Lobar pneumonia, unspecified organism: Secondary | ICD-10-CM | POA: Diagnosis not present

## 2015-09-22 DIAGNOSIS — J069 Acute upper respiratory infection, unspecified: Secondary | ICD-10-CM | POA: Diagnosis not present

## 2015-10-27 DIAGNOSIS — E669 Obesity, unspecified: Secondary | ICD-10-CM | POA: Diagnosis not present

## 2015-10-27 DIAGNOSIS — B353 Tinea pedis: Secondary | ICD-10-CM | POA: Diagnosis not present

## 2015-10-27 DIAGNOSIS — E1142 Type 2 diabetes mellitus with diabetic polyneuropathy: Secondary | ICD-10-CM | POA: Diagnosis not present

## 2015-10-27 DIAGNOSIS — I10 Essential (primary) hypertension: Secondary | ICD-10-CM | POA: Diagnosis not present

## 2015-12-03 IMAGING — NM NM MYOCAR MULTI W/SPECT W/WALL MOTION & EF
2 series · 12 of 12 positions shown · non-contrast
Comparison: None.

CLINICAL DATA: 53-year-old male undergoing preoperative cardiac
evaluation for hip replacement surgery with history of diabetes
mellitus and tobacco use, as well as an abnormal ECG, referred for
an ischemic evaluation.

EXAM:
MYOCARDIAL IMAGING WITH SPECT (REST AND PHARMACOLOGIC-STRESS)
GATED LEFT VENTRICULAR WALL MOTION STUDY
LEFT VENTRICULAR EJECTION FRACTION
TECHNIQUE: Standard myocardial SPECT imaging was performed after resting
intravenous injection of 10 mCi Yc-77m sestamibi. Subsequently,
intravenous infusion of Lexiscan was performed under the supervision
of the Cardiology staff. At peak effect of the drug, 30 mCi Yc-77m
sestamibi was injected intravenously and standard myocardial SPECT
imaging was performed. Quantitative gated imaging was also performed
to evaluate left ventricular wall motion, and estimate left
ventricular ejection fraction.

[Series 1: rest · 8.28mm/px · 6 of 64 frames shown]
[frame 6/64]
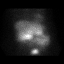
[frame 16/64]
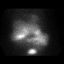
[frame 27/64]
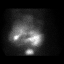
[frame 38/64]
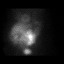
[frame 48/64]
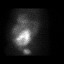
[frame 59/64]
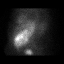

[Series 2: stress gated · 8.28mm/px · 6 of 64 frames shown]
[frame 6/64]
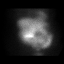
[frame 16/64]
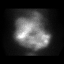
[frame 27/64]
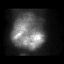
[frame 38/64]
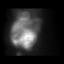
[frame 48/64]
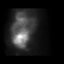
[frame 59/64]
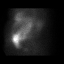

[12 of 12 positions shown; findings below may reference images not displayed]

FINDINGS: Stress/ECG data: The patient was stressed according to the Lexiscan
protocol. The resting heart rate of 75 beats per min rose to a
maximal heart rate of 109 beats per min. The blood pressure ranged
from 119/65 up to 136/65. No chest pain was reported.

Resting ECG demonstrated normal sinus rhythm with possible old
anteroseptal infarct. There were no ischemic ST segment or T-wave
abnormalities, nor any arrhythmias with stress.

Perfusion: No decreased activity in the left ventricle on stress
imaging to suggest reversible ischemia or infarction. Small, mild,
fixed inferior wall defect extending from the apex to base, worse on
rest than stress images, with normal regional wall motion,
suggestive of inferior wall soft tissue attenuation artifact.

Wall Motion: Normal left ventricular wall motion. No left
ventricular dilation.

Left Ventricular Ejection Fraction: 63 %

End diastolic volume 99 ml

End systolic volume 37 ml
IMPRESSION: 1. No reversible ischemia or infarction. Inferior wall soft tissue
attenuation artifact noted.

2. Normal left ventricular wall motion.

3. Left ventricular ejection fraction 63%

4. Low-risk stress test findings*.

*0650 Appropriate Use Criteria for Coronary Revascularization
Focused Update: J Am Coll Cardiol. 0650;59(9):857-881.
[URL]

## 2016-03-15 DIAGNOSIS — Z23 Encounter for immunization: Secondary | ICD-10-CM | POA: Diagnosis not present

## 2016-03-15 DIAGNOSIS — I10 Essential (primary) hypertension: Secondary | ICD-10-CM | POA: Diagnosis not present

## 2016-03-15 DIAGNOSIS — E118 Type 2 diabetes mellitus with unspecified complications: Secondary | ICD-10-CM | POA: Diagnosis not present

## 2016-03-15 DIAGNOSIS — E669 Obesity, unspecified: Secondary | ICD-10-CM | POA: Diagnosis not present

## 2016-03-17 DIAGNOSIS — I10 Essential (primary) hypertension: Secondary | ICD-10-CM | POA: Diagnosis not present

## 2016-03-17 DIAGNOSIS — E1142 Type 2 diabetes mellitus with diabetic polyneuropathy: Secondary | ICD-10-CM | POA: Diagnosis not present

## 2016-06-15 DIAGNOSIS — Z125 Encounter for screening for malignant neoplasm of prostate: Secondary | ICD-10-CM | POA: Diagnosis not present

## 2016-06-15 DIAGNOSIS — I10 Essential (primary) hypertension: Secondary | ICD-10-CM | POA: Diagnosis not present

## 2016-06-15 DIAGNOSIS — E118 Type 2 diabetes mellitus with unspecified complications: Secondary | ICD-10-CM | POA: Diagnosis not present

## 2016-08-19 DIAGNOSIS — Z0000 Encounter for general adult medical examination without abnormal findings: Secondary | ICD-10-CM | POA: Diagnosis not present

## 2016-09-02 ENCOUNTER — Encounter: Payer: 59 | Attending: Family Medicine | Admitting: Nutrition

## 2016-09-02 VITALS — Ht 65.0 in | Wt 326.0 lb

## 2016-09-02 DIAGNOSIS — H52223 Regular astigmatism, bilateral: Secondary | ICD-10-CM | POA: Diagnosis not present

## 2016-09-02 DIAGNOSIS — Z713 Dietary counseling and surveillance: Secondary | ICD-10-CM | POA: Diagnosis not present

## 2016-09-02 DIAGNOSIS — E669 Obesity, unspecified: Secondary | ICD-10-CM | POA: Insufficient documentation

## 2016-09-02 DIAGNOSIS — E119 Type 2 diabetes mellitus without complications: Secondary | ICD-10-CM | POA: Insufficient documentation

## 2016-09-02 DIAGNOSIS — Z7984 Long term (current) use of oral hypoglycemic drugs: Secondary | ICD-10-CM | POA: Diagnosis not present

## 2016-09-02 DIAGNOSIS — H5213 Myopia, bilateral: Secondary | ICD-10-CM | POA: Diagnosis not present

## 2016-09-02 NOTE — Patient Instructions (Signed)
Goals 1. Eat meals on time as discussed.1 2. Portion foods out 3. Follow The Plate Method 3. Eat 3-4 carb choices per meals. 5. Cut out diet sodas, juice and only drink water Lose 1-2 lbs per week Get A1C to 6.5% or less.

## 2016-09-02 NOTE — Progress Notes (Signed)
  Medical Nutrition Therapy:  Appt start time: 1330 end time:  1430.  Assessment:  Primary concerns today: Obesity and Type 2 DM. Had DM Type 2 for 6 years. LIves with his brother. He does his own cooking and shopping for himself..  . Has gaiend about 60-75 lbs in the last 2-3 years after hip surgery. Had been in a nursing home getting PT after problems with his left leg and ended up Dec 15th 2015 with a hip replacement.    Has been walking with a walker and now has been advanced to a cane. He works part time. Most recent A1C. 7.2%. Checks blood sugars twice a day. BS running 120's in am. HS in the 120-130's mg/dl.  Metformin 500 mg BID.Had been on  insulin in 2014 during his hospital stay after issues with with possible DKA? But not since then.        Eats 2-3 meals per day. Weight gain had come from after hospital stay and not being able to walk. For awhile.     He is watching more of what he eats and trying to walk more for exercise. He is motivated to lose weight and wants to control his DM with better eating habits and weight loss.      Preferred Learning Style:    No preference indicated   Learning Readiness:   Ready  Change in progress   MEDICATIONS:    DIETARY INTAKE:  24-hr recall:  B ( AM): [place] bacon  2-3 and eggs-1-2, Orange or cranberry/grape Juice or water,  Snk ( AM):  L ( PM): Hamburger and fry, Diet soda, water   Snk ( PM):  D ( PM): Baked chicken, potatoes, 1 cup, grapes, Green Diet Tea  Snk ( PM):  Beverages: Water, diet sodas,   Usual physical activity: Walking a little with his cane.  Estimated energy needs: 1600  calories 180  g carbohydrates 120 g protein 44 g fat  Progress Towards Goal(s):  In progress.   Nutritional Diagnosis:  NB-1.1 Food and nutrition-related knowledge deficit As related to Diabetes and morbid obesity.  As evidenced by A1C 7.2% and BMI > 50..    Intervention:  Nutrition and Diabetes education provided on My Plate, CHO  counting, meal planning, portion sizes, timing of meals, avoiding snacks between meals unless having a low blood sugar, target ranges for A1C and blood sugars, signs/symptoms and treatment of hyper/hypoglycemia, monitoring blood sugars, taking medications as prescribed, benefits of exercising 30 minutes per day and prevention of complications of DM.  Goals 1. Eat meals on time as discussed. 2. Portion foods out 3. Follow The Plate Method 3. Eat 3-4 carb choices per meals. 5. Cut out diet sodas, juice and only drink water Lose 1-2 lbs per week Get A1C to 6.5% or less.   Teaching Method Utilized:  Visual Auditory Hands on  Handouts given during visit include:  The Plate Method  Meal Plan Card   Barriers to learning/adherence to lifestyle change: limited mobility s/p hip surgery and obesity  Demonstrated degree of understanding via:  Teach Back   Monitoring/Evaluation:  Dietary intake, exercise, meal planning, SBG, and body weight in 1 month(s).

## 2016-09-13 DIAGNOSIS — I10 Essential (primary) hypertension: Secondary | ICD-10-CM | POA: Diagnosis not present

## 2016-09-13 DIAGNOSIS — E118 Type 2 diabetes mellitus with unspecified complications: Secondary | ICD-10-CM | POA: Diagnosis not present

## 2016-09-13 DIAGNOSIS — G4730 Sleep apnea, unspecified: Secondary | ICD-10-CM | POA: Diagnosis not present

## 2016-10-20 ENCOUNTER — Ambulatory Visit: Payer: 59 | Admitting: Nutrition

## 2016-12-13 DIAGNOSIS — I10 Essential (primary) hypertension: Secondary | ICD-10-CM | POA: Diagnosis not present

## 2016-12-13 DIAGNOSIS — E118 Type 2 diabetes mellitus with unspecified complications: Secondary | ICD-10-CM | POA: Diagnosis not present

## 2016-12-13 DIAGNOSIS — J069 Acute upper respiratory infection, unspecified: Secondary | ICD-10-CM | POA: Diagnosis not present

## 2016-12-27 DIAGNOSIS — E118 Type 2 diabetes mellitus with unspecified complications: Secondary | ICD-10-CM | POA: Diagnosis not present

## 2016-12-27 DIAGNOSIS — R05 Cough: Secondary | ICD-10-CM | POA: Diagnosis not present

## 2017-03-28 DIAGNOSIS — E118 Type 2 diabetes mellitus with unspecified complications: Secondary | ICD-10-CM | POA: Diagnosis not present

## 2017-03-28 DIAGNOSIS — B353 Tinea pedis: Secondary | ICD-10-CM | POA: Diagnosis not present

## 2017-06-27 DIAGNOSIS — B353 Tinea pedis: Secondary | ICD-10-CM | POA: Diagnosis not present

## 2017-06-27 DIAGNOSIS — E118 Type 2 diabetes mellitus with unspecified complications: Secondary | ICD-10-CM | POA: Diagnosis not present

## 2017-06-27 DIAGNOSIS — Z0000 Encounter for general adult medical examination without abnormal findings: Secondary | ICD-10-CM | POA: Diagnosis not present

## 2017-06-27 DIAGNOSIS — E785 Hyperlipidemia, unspecified: Secondary | ICD-10-CM | POA: Diagnosis not present

## 2017-06-27 DIAGNOSIS — R05 Cough: Secondary | ICD-10-CM | POA: Diagnosis not present

## 2017-07-26 DIAGNOSIS — Z7982 Long term (current) use of aspirin: Secondary | ICD-10-CM | POA: Diagnosis not present

## 2017-07-26 DIAGNOSIS — T6591XA Toxic effect of unspecified substance, accidental (unintentional), initial encounter: Secondary | ICD-10-CM | POA: Diagnosis not present

## 2017-07-26 DIAGNOSIS — T50901A Poisoning by unspecified drugs, medicaments and biological substances, accidental (unintentional), initial encounter: Secondary | ICD-10-CM | POA: Diagnosis not present

## 2017-07-26 DIAGNOSIS — Z79899 Other long term (current) drug therapy: Secondary | ICD-10-CM | POA: Insufficient documentation

## 2017-07-26 DIAGNOSIS — F17210 Nicotine dependence, cigarettes, uncomplicated: Secondary | ICD-10-CM | POA: Diagnosis not present

## 2017-07-26 DIAGNOSIS — Z7984 Long term (current) use of oral hypoglycemic drugs: Secondary | ICD-10-CM | POA: Insufficient documentation

## 2017-07-26 DIAGNOSIS — E119 Type 2 diabetes mellitus without complications: Secondary | ICD-10-CM | POA: Insufficient documentation

## 2017-07-27 ENCOUNTER — Other Ambulatory Visit: Payer: Self-pay

## 2017-07-27 ENCOUNTER — Emergency Department (HOSPITAL_COMMUNITY)
Admission: EM | Admit: 2017-07-27 | Discharge: 2017-07-27 | Disposition: A | Discharge status: Home / Self Care | Payer: Medicare HMO | Attending: Emergency Medicine | Admitting: Emergency Medicine

## 2017-07-27 ENCOUNTER — Encounter (HOSPITAL_COMMUNITY): Payer: Self-pay | Admitting: *Deleted

## 2017-07-27 DIAGNOSIS — T6591XA Toxic effect of unspecified substance, accidental (unintentional), initial encounter: Secondary | ICD-10-CM

## 2017-07-27 NOTE — ED Provider Notes (Signed)
[facility] EMERGENCY DEPARTMENT Provider Note   CSN: [id number] Arrival date & time: 07/26/17  2354     History   Chief Complaint Chief Complaint  Patient presents with  . Drug Overdose    HPI Luke [name] is a 57 y.o. male.  HPI  This is a 57 year old male with a history of diabetes who presents after an accidental ingestion.  Patient reports that he accidentally drank some vaporizing steam liquid that goes in his humidifier.  He states that he was beside his water on his bedside table.  He knew immediately that he had had ingested something incorrectly.  Currently he denies any shortness of breath or sore throat.  He does report dry throat.  He reports that it was the [place] equate brand.  He denies any other symptoms at this time.  Poison control notified by nursing.  Recommend 4-hour observation.  Given ingredients, may increase patient's likelihood of seizure.  No lab work recommended.  Past Medical History:  Diagnosis Date  . 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 Cervical stenosis of spine 10/20/2012   Mild per CT  . Cholelithiasis 10/20/2012  . Chronic inflammatory arthritis 10/21/2012  . DM (diabetes mellitus) (HCC)   . DM (diabetes mellitus) type 2, uncontrolled, with ketoacidosis (HCC) 10/16/2012   DKA  . Gout    Question diagnosis  . Pain, joint, multiple sites   . Pneumonia   . Protein-calorie malnutrition, severe (HCC) 10/20/2012  . Right inguinal hernia 10/20/2012   Large, without obstruction  . Unintentional weight loss 10/16/2012    Patient Active Problem List   Diagnosis Date Noted  . Arthritis of left hip 04/19/2014  . Left hip pain 01/17/2013  . Chronic steroid use 01/17/2013  . Septic arthritis of hip (HCC) 01/17/2013  . 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 Chronic inflammatory arthritis 10/21/2012  . Protein-calorie malnutrition,  severe (HCC) 10/20/2012  . Cervical stenosis of spine 10/20/2012  . Right inguinal hernia 10/20/2012  . Cholelithiasis 10/20/2012  . Neck stiffness 10/19/2012  . Neck pain, bilateral 10/19/2012  . Cellulitis of leg, right 10/19/2012  . Sinus tachycardia 10/18/2012  . Leg edema, right 10/18/2012  . Unintentional weight loss 10/18/2012  . Hyponatremia 10/18/2012  . Cellulitis of left lower extremity 10/18/2012  . DM (diabetes mellitus) type 2, uncontrolled, with ketoacidosis (HCC) 10/17/2012  . Acute gout 10/17/2012  . Obesity 10/17/2012  . DKA (diabetic ketoacidoses) (HCC) 10/15/2012  . Diabetes mellitus type 2 in obese (HCC) 10/15/2012  . Ulna distal fracture 12/22/2010    Past Surgical History:  Procedure Laterality Date  . NO PAST SURGERIES    . TOTAL HIP ARTHROPLASTY Left 04/19/2014   Procedure: LEFT TOTAL HIP ARTHROPLASTY;  Surgeon: Nestor Lewandowsky, MD;  Location: [facility] OR;  Service: Orthopedics;  Laterality: Left;        Home Medications    Prior to Admission medications   Medication Sig Start Date End Date Taking? Authorizing Provider  acetaminophen (TYLENOL) 650 MG CR tablet Take 650 mg by mouth as needed for pain.    [provider]  aspirin EC 325 MG tablet Take 1 tablet (325 mg total) by mouth 2 (two) times daily. 04/19/14   Allena Katz, PA-C  diphenhydrAMINE (BENADRYL) 25 MG tablet Take 25-50 mg by mouth daily as needed for itching or allergies.     [provider]  gabapentin (NEURONTIN) 300  MG capsule Take 300 mg by mouth 2 (two) times daily.     [provider]  lisinopril (PRINIVIL,ZESTRIL) 2.5 MG tablet Take 2.5 mg by mouth daily. 02/26/14   [provider]  metFORMIN (GLUCOPHAGE) 500 MG tablet Take 500 mg by mouth 2 (two) times daily with a meal.    [provider]  methocarbamol (ROBAXIN) 500 MG tablet Take 1 tablet (500 mg total) by mouth 2 (two) times daily with a meal. Patient not taking: Reported on 09/02/2016  04/19/14   Allena KatzPhillips, Eric K, PA-C  oxyCODONE-acetaminophen (ROXICET) 5-325 MG per tablet Take 1 tablet by mouth every 4 (four) hours as needed. Patient not taking: Reported on 09/02/2016 04/19/14   Allena KatzPhillips, Eric K, PA-C    Family History Family History  Problem Relation Age of Onset  . CVA Mother   . Anxiety disorder Father   . Arthritis Unknown   . Diabetes Brother   . Diabetes Brother     Social History Social History   Tobacco Use  . Smoking status: Current Every Day Smoker    Packs/day: 1.00    Types: Cigarettes    Last attempt to quit: 10/14/2012    Years since quitting: 4.7  . Smokeless tobacco: Never Used  . Tobacco comment: trying to quit  Substance Use Topics  . Alcohol use: No    Alcohol/week: 0.0 oz  . Drug use: No     Allergies   Bee venom and Shellfish allergy   Review of Systems Review of Systems  Constitutional: Negative for fever.  HENT: Negative for sore throat.        Dry throat  Respiratory: Negative for shortness of breath.   Cardiovascular: Negative for chest pain.  All other systems reviewed and are negative.    Physical Exam Updated Vital Signs BP 137/61 (BP Location: Left Wrist)   Pulse 92   Temp 98.5 F (36.9 C) (Oral)   Resp 18   Ht 5\' 6"  (1.676 m)   Wt (!) 145.2 kg (320 lb)   SpO2 95%   BMI 51.65 kg/m   Physical Exam  Constitutional: He is oriented to person, place, and time. He appears well-developed and well-nourished.  Morbidly obese, no acute distress  HENT:  Head: Normocephalic and atraumatic.  Mouth/Throat: Oropharynx is clear and moist.  Cardiovascular: Normal rate, regular rhythm and normal heart sounds.  No murmur heard. Pulmonary/Chest: Effort normal and breath sounds normal. No respiratory distress. He has no wheezes.  Distant breath sounds, no respiratory distress  Abdominal: Soft. There is no tenderness.  Neurological: He is alert and oriented to person, place, and time.  Skin: Skin is warm and dry.    Psychiatric: He has a normal mood and affect.  Nursing note and vitals reviewed.    ED Treatments / Results  Labs (all labs ordered are listed, but only abnormal results are displayed) Labs Reviewed - No data to display  EKG None  Radiology No results found.  Procedures Procedures (including critical care time)  Medications Ordered in ED Medications - No data to display   Initial Impression / Assessment and Plan / ED Course  I have reviewed the triage vital signs and the nursing notes.  Pertinent labs & imaging results that were available during my care of the patient were reviewed by me and considered in my medical decision making (see chart for details).     She presents with accidental ingestion of a humidifier additive.  Per poison control, may have  increased risk for seizure activity.  Recommend monitoring for 4 hours.  Patient was observed in the emergency room 4 hours postingestion without any ill effects.  He is at his baseline.  After history, exam, and medical workup I feel the patient has been appropriately medically screened and is safe for discharge home. Pertinent diagnoses were discussed with the patient. Patient was given return precautions.   Final Clinical Impressions(s) / ED Diagnoses   Final diagnoses:  Ingestion of substance, accidental or unintentional, initial encounter    ED Discharge Orders    None       Shon Baton, MD 07/27/17 [id number]

## 2017-07-27 NOTE — ED Triage Notes (Signed)
Pt states he accidentally took a "large swig" of vaporizing steam liquid thinking it was his medication; pt states his throat feels dry

## 2017-07-27 NOTE — Discharge Instructions (Addendum)
You were seen today and evaluated for an accidental ingestion.  You have been stable in the emergency room.  You should not expect any additional consequences from the ingestion.

## 2017-07-27 NOTE — ED Notes (Signed)
Called and spoke with Luke BarriosLouisa from poison control; she stated pt has a possibility of seizures 1-2 hours after ingestion; pt is not to drink milk or take antacids and pt needs to be monitored for 3-4 hours for observation; pt is sitting on edge of bed, no complaints at this time

## 2018-08-28 DIAGNOSIS — B353 Tinea pedis: Secondary | ICD-10-CM | POA: Diagnosis not present

## 2018-08-28 DIAGNOSIS — E785 Hyperlipidemia, unspecified: Secondary | ICD-10-CM | POA: Diagnosis not present

## 2018-08-28 DIAGNOSIS — E1169 Type 2 diabetes mellitus with other specified complication: Secondary | ICD-10-CM | POA: Diagnosis not present

## 2018-08-28 DIAGNOSIS — I10 Essential (primary) hypertension: Secondary | ICD-10-CM | POA: Diagnosis not present

## 2018-08-28 DIAGNOSIS — E669 Obesity, unspecified: Secondary | ICD-10-CM | POA: Diagnosis not present

## 2018-08-28 DIAGNOSIS — Z7189 Other specified counseling: Secondary | ICD-10-CM | POA: Diagnosis not present

## 2018-08-28 DIAGNOSIS — E789 Disorder of lipoprotein metabolism, unspecified: Secondary | ICD-10-CM | POA: Diagnosis not present

## 2018-12-26 DIAGNOSIS — E1142 Type 2 diabetes mellitus with diabetic polyneuropathy: Secondary | ICD-10-CM | POA: Diagnosis not present

## 2018-12-26 DIAGNOSIS — M25562 Pain in left knee: Secondary | ICD-10-CM | POA: Diagnosis not present

## 2018-12-26 DIAGNOSIS — E1169 Type 2 diabetes mellitus with other specified complication: Secondary | ICD-10-CM | POA: Diagnosis not present

## 2018-12-26 DIAGNOSIS — I10 Essential (primary) hypertension: Secondary | ICD-10-CM | POA: Diagnosis not present

## 2018-12-26 DIAGNOSIS — E669 Obesity, unspecified: Secondary | ICD-10-CM | POA: Diagnosis not present

## 2018-12-26 DIAGNOSIS — E789 Disorder of lipoprotein metabolism, unspecified: Secondary | ICD-10-CM | POA: Diagnosis not present

## 2018-12-26 DIAGNOSIS — B353 Tinea pedis: Secondary | ICD-10-CM | POA: Diagnosis not present

## 2018-12-26 DIAGNOSIS — E785 Hyperlipidemia, unspecified: Secondary | ICD-10-CM | POA: Diagnosis not present

## 2019-02-07 DIAGNOSIS — R799 Abnormal finding of blood chemistry, unspecified: Secondary | ICD-10-CM | POA: Diagnosis not present

## 2019-04-02 DIAGNOSIS — I10 Essential (primary) hypertension: Secondary | ICD-10-CM | POA: Diagnosis not present

## 2019-04-02 DIAGNOSIS — Z7189 Other specified counseling: Secondary | ICD-10-CM | POA: Diagnosis not present

## 2019-04-02 DIAGNOSIS — B353 Tinea pedis: Secondary | ICD-10-CM | POA: Diagnosis not present

## 2019-04-02 DIAGNOSIS — E1169 Type 2 diabetes mellitus with other specified complication: Secondary | ICD-10-CM | POA: Diagnosis not present

## 2019-04-02 DIAGNOSIS — J9801 Acute bronchospasm: Secondary | ICD-10-CM | POA: Diagnosis not present

## 2019-04-02 DIAGNOSIS — R0683 Snoring: Secondary | ICD-10-CM | POA: Diagnosis not present

## 2019-04-03 DIAGNOSIS — Z23 Encounter for immunization: Secondary | ICD-10-CM | POA: Diagnosis not present

## 2019-05-14 ENCOUNTER — Other Ambulatory Visit (HOSPITAL_COMMUNITY): Payer: Self-pay | Admitting: Family Medicine

## 2019-05-14 DIAGNOSIS — R1909 Other intra-abdominal and pelvic swelling, mass and lump: Secondary | ICD-10-CM

## 2019-05-14 DIAGNOSIS — N433 Hydrocele, unspecified: Secondary | ICD-10-CM

## 2019-05-18 ENCOUNTER — Other Ambulatory Visit: Payer: Self-pay

## 2019-05-18 ENCOUNTER — Ambulatory Visit (HOSPITAL_COMMUNITY)
Admission: RE | Admit: 2019-05-18 | Discharge: 2019-05-18 | Disposition: A | Discharge status: Home / Self Care | Payer: Medicare HMO | Source: Ambulatory Visit | Attending: Family Medicine | Admitting: Family Medicine

## 2019-05-18 DIAGNOSIS — R1909 Other intra-abdominal and pelvic swelling, mass and lump: Secondary | ICD-10-CM | POA: Insufficient documentation

## 2019-05-18 DIAGNOSIS — N433 Hydrocele, unspecified: Secondary | ICD-10-CM | POA: Diagnosis not present

## 2019-05-18 DIAGNOSIS — N5089 Other specified disorders of the male genital organs: Secondary | ICD-10-CM | POA: Diagnosis not present

## 2019-05-23 DIAGNOSIS — E119 Type 2 diabetes mellitus without complications: Secondary | ICD-10-CM | POA: Diagnosis not present

## 2019-07-09 ENCOUNTER — Ambulatory Visit: Payer: Medicare HMO | Attending: Internal Medicine

## 2019-07-09 DIAGNOSIS — Z23 Encounter for immunization: Secondary | ICD-10-CM

## 2019-07-09 NOTE — Progress Notes (Signed)
   Covid-19 Vaccination Clinic  Name:  Luke [name]    MRN: [id number] DOB: [birth year]  07/09/2019  [name] was observed post Covid-19 immunization for 15 minutes without incident. He was provided with Vaccine Information Sheet and instruction to access the V-Safe system.   [name] was instructed to call 911 with any severe reactions post vaccine: Marland Kitchen Difficulty breathing  . Swelling of face and throat  . A fast heartbeat  . A bad rash all over body  . Dizziness and weakness   Immunizations Administered    Name Date Dose VIS Date Route   Pfizer COVID-19 Vaccine 07/09/2019 10:53 AM 0.3 mL 04/13/2019 Intramuscular   Manufacturer: [employer], [employer]   Lot: [id number]   NDC: [address]-4

## 2019-07-23 DIAGNOSIS — Z6841 Body Mass Index (BMI) 40.0 and over, adult: Secondary | ICD-10-CM | POA: Diagnosis not present

## 2019-07-23 DIAGNOSIS — E1169 Type 2 diabetes mellitus with other specified complication: Secondary | ICD-10-CM | POA: Diagnosis not present

## 2019-07-23 DIAGNOSIS — K4090 Unilateral inguinal hernia, without obstruction or gangrene, not specified as recurrent: Secondary | ICD-10-CM | POA: Diagnosis not present

## 2019-08-08 ENCOUNTER — Ambulatory Visit: Payer: Medicare HMO | Attending: Internal Medicine

## 2019-08-08 DIAGNOSIS — Z23 Encounter for immunization: Secondary | ICD-10-CM

## 2019-08-08 NOTE — Progress Notes (Signed)
   Covid-19 Vaccination Clinic  Name:  Luke [name]    MRN: [id number] DOB: Jun 30, 1960  08/08/2019  Luke [name] was observed post Covid-19 immunization for 15 minutes without incident. He was provided with Vaccine Information Sheet and instruction to access the V-Safe system.   Luke [name] was instructed to call 911 with any severe reactions post vaccine: Marland Kitchen Difficulty breathing  . Swelling of face and throat  . A fast heartbeat  . A bad rash all over body  . Dizziness and weakness   Immunizations Administered    Name Date Dose VIS Date Route   Pfizer COVID-19 Vaccine 08/08/2019 11:14 AM 0.3 mL 04/13/2019 Intramuscular   Manufacturer: [employer], [employer]   Lot: [id number]   NDC: [address]-0

## 2019-10-15 DIAGNOSIS — K4030 Unilateral inguinal hernia, with obstruction, without gangrene, not specified as recurrent: Secondary | ICD-10-CM | POA: Diagnosis not present

## 2019-10-15 DIAGNOSIS — E1169 Type 2 diabetes mellitus with other specified complication: Secondary | ICD-10-CM | POA: Diagnosis not present

## 2019-10-15 DIAGNOSIS — E785 Hyperlipidemia, unspecified: Secondary | ICD-10-CM | POA: Diagnosis not present

## 2019-10-15 DIAGNOSIS — E1165 Type 2 diabetes mellitus with hyperglycemia: Secondary | ICD-10-CM | POA: Diagnosis not present

## 2019-11-13 DIAGNOSIS — K4090 Unilateral inguinal hernia, without obstruction or gangrene, not specified as recurrent: Secondary | ICD-10-CM | POA: Diagnosis not present

## 2019-11-13 DIAGNOSIS — E1140 Type 2 diabetes mellitus with diabetic neuropathy, unspecified: Secondary | ICD-10-CM | POA: Diagnosis not present

## 2019-11-13 DIAGNOSIS — Z6841 Body Mass Index (BMI) 40.0 and over, adult: Secondary | ICD-10-CM | POA: Diagnosis not present

## 2020-01-21 DIAGNOSIS — E785 Hyperlipidemia, unspecified: Secondary | ICD-10-CM | POA: Diagnosis not present

## 2020-01-21 DIAGNOSIS — E1169 Type 2 diabetes mellitus with other specified complication: Secondary | ICD-10-CM | POA: Diagnosis not present

## 2020-04-01 DIAGNOSIS — E7849 Other hyperlipidemia: Secondary | ICD-10-CM | POA: Diagnosis not present

## 2020-04-01 DIAGNOSIS — I10 Essential (primary) hypertension: Secondary | ICD-10-CM | POA: Diagnosis not present

## 2020-04-01 DIAGNOSIS — E785 Hyperlipidemia, unspecified: Secondary | ICD-10-CM | POA: Diagnosis not present

## 2020-04-01 DIAGNOSIS — E118 Type 2 diabetes mellitus with unspecified complications: Secondary | ICD-10-CM | POA: Diagnosis not present

## 2020-04-21 DIAGNOSIS — E785 Hyperlipidemia, unspecified: Secondary | ICD-10-CM | POA: Diagnosis not present

## 2020-04-21 DIAGNOSIS — K4090 Unilateral inguinal hernia, without obstruction or gangrene, not specified as recurrent: Secondary | ICD-10-CM | POA: Diagnosis not present

## 2020-04-21 DIAGNOSIS — E1169 Type 2 diabetes mellitus with other specified complication: Secondary | ICD-10-CM | POA: Diagnosis not present

## 2020-04-21 DIAGNOSIS — E782 Mixed hyperlipidemia: Secondary | ICD-10-CM | POA: Diagnosis not present

## 2020-05-02 DIAGNOSIS — E785 Hyperlipidemia, unspecified: Secondary | ICD-10-CM | POA: Diagnosis not present

## 2020-05-02 DIAGNOSIS — E7849 Other hyperlipidemia: Secondary | ICD-10-CM | POA: Diagnosis not present

## 2020-05-02 DIAGNOSIS — E118 Type 2 diabetes mellitus with unspecified complications: Secondary | ICD-10-CM | POA: Diagnosis not present

## 2020-05-02 DIAGNOSIS — I10 Essential (primary) hypertension: Secondary | ICD-10-CM | POA: Diagnosis not present

## 2020-08-12 DIAGNOSIS — E785 Hyperlipidemia, unspecified: Secondary | ICD-10-CM | POA: Diagnosis not present

## 2020-08-12 DIAGNOSIS — I10 Essential (primary) hypertension: Secondary | ICD-10-CM | POA: Diagnosis not present

## 2020-08-12 DIAGNOSIS — E789 Disorder of lipoprotein metabolism, unspecified: Secondary | ICD-10-CM | POA: Diagnosis not present

## 2020-08-12 DIAGNOSIS — E1169 Type 2 diabetes mellitus with other specified complication: Secondary | ICD-10-CM | POA: Diagnosis not present

## 2020-08-12 DIAGNOSIS — M67431 Ganglion, right wrist: Secondary | ICD-10-CM | POA: Diagnosis not present

## 2020-08-12 DIAGNOSIS — Z6841 Body Mass Index (BMI) 40.0 and over, adult: Secondary | ICD-10-CM | POA: Diagnosis not present

## 2020-08-12 DIAGNOSIS — E663 Overweight: Secondary | ICD-10-CM | POA: Diagnosis not present

## 2020-08-12 DIAGNOSIS — K4090 Unilateral inguinal hernia, without obstruction or gangrene, not specified as recurrent: Secondary | ICD-10-CM | POA: Diagnosis not present

## 2020-11-11 DIAGNOSIS — R0683 Snoring: Secondary | ICD-10-CM | POA: Diagnosis not present

## 2020-11-11 DIAGNOSIS — M1712 Unilateral primary osteoarthritis, left knee: Secondary | ICD-10-CM | POA: Diagnosis not present

## 2020-11-11 DIAGNOSIS — E785 Hyperlipidemia, unspecified: Secondary | ICD-10-CM | POA: Diagnosis not present

## 2020-11-11 DIAGNOSIS — E1169 Type 2 diabetes mellitus with other specified complication: Secondary | ICD-10-CM | POA: Diagnosis not present

## 2020-11-11 DIAGNOSIS — I10 Essential (primary) hypertension: Secondary | ICD-10-CM | POA: Diagnosis not present

## 2020-11-11 DIAGNOSIS — Z6841 Body Mass Index (BMI) 40.0 and over, adult: Secondary | ICD-10-CM | POA: Diagnosis not present

## 2020-11-30 DIAGNOSIS — I10 Essential (primary) hypertension: Secondary | ICD-10-CM | POA: Diagnosis not present

## 2020-11-30 DIAGNOSIS — E7849 Other hyperlipidemia: Secondary | ICD-10-CM | POA: Diagnosis not present

## 2020-11-30 DIAGNOSIS — E118 Type 2 diabetes mellitus with unspecified complications: Secondary | ICD-10-CM | POA: Diagnosis not present

## 2020-12-31 DIAGNOSIS — E118 Type 2 diabetes mellitus with unspecified complications: Secondary | ICD-10-CM | POA: Diagnosis not present

## 2020-12-31 DIAGNOSIS — E7849 Other hyperlipidemia: Secondary | ICD-10-CM | POA: Diagnosis not present

## 2020-12-31 DIAGNOSIS — I10 Essential (primary) hypertension: Secondary | ICD-10-CM | POA: Diagnosis not present

## 2021-02-10 ENCOUNTER — Other Ambulatory Visit: Payer: Self-pay

## 2021-02-10 ENCOUNTER — Encounter (HOSPITAL_COMMUNITY): Payer: Self-pay | Admitting: *Deleted

## 2021-02-10 ENCOUNTER — Emergency Department (HOSPITAL_COMMUNITY)
Admission: EM | Admit: 2021-02-10 | Discharge: 2021-02-11 | Disposition: A | Discharge status: Home / Self Care | Payer: Medicare HMO | Attending: Emergency Medicine | Admitting: Emergency Medicine

## 2021-02-10 DIAGNOSIS — N390 Urinary tract infection, site not specified: Secondary | ICD-10-CM | POA: Diagnosis not present

## 2021-02-10 DIAGNOSIS — L089 Local infection of the skin and subcutaneous tissue, unspecified: Secondary | ICD-10-CM | POA: Insufficient documentation

## 2021-02-10 DIAGNOSIS — F17210 Nicotine dependence, cigarettes, uncomplicated: Secondary | ICD-10-CM | POA: Insufficient documentation

## 2021-02-10 DIAGNOSIS — Z79899 Other long term (current) drug therapy: Secondary | ICD-10-CM | POA: Insufficient documentation

## 2021-02-10 DIAGNOSIS — Z6841 Body Mass Index (BMI) 40.0 and over, adult: Secondary | ICD-10-CM | POA: Diagnosis not present

## 2021-02-10 DIAGNOSIS — I10 Essential (primary) hypertension: Secondary | ICD-10-CM | POA: Diagnosis not present

## 2021-02-10 DIAGNOSIS — R0683 Snoring: Secondary | ICD-10-CM | POA: Diagnosis not present

## 2021-02-10 DIAGNOSIS — Z96642 Presence of left artificial hip joint: Secondary | ICD-10-CM | POA: Diagnosis not present

## 2021-02-10 DIAGNOSIS — E1165 Type 2 diabetes mellitus with hyperglycemia: Secondary | ICD-10-CM | POA: Insufficient documentation

## 2021-02-10 DIAGNOSIS — M170 Bilateral primary osteoarthritis of knee: Secondary | ICD-10-CM | POA: Diagnosis not present

## 2021-02-10 DIAGNOSIS — R69 Illness, unspecified: Secondary | ICD-10-CM | POA: Diagnosis not present

## 2021-02-10 DIAGNOSIS — Z23 Encounter for immunization: Secondary | ICD-10-CM | POA: Diagnosis not present

## 2021-02-10 DIAGNOSIS — S81801A Unspecified open wound, right lower leg, initial encounter: Secondary | ICD-10-CM | POA: Diagnosis not present

## 2021-02-10 DIAGNOSIS — R739 Hyperglycemia, unspecified: Secondary | ICD-10-CM

## 2021-02-10 DIAGNOSIS — Z7984 Long term (current) use of oral hypoglycemic drugs: Secondary | ICD-10-CM | POA: Diagnosis not present

## 2021-02-10 DIAGNOSIS — R5383 Other fatigue: Secondary | ICD-10-CM | POA: Diagnosis present

## 2021-02-10 DIAGNOSIS — Z7982 Long term (current) use of aspirin: Secondary | ICD-10-CM | POA: Diagnosis not present

## 2021-02-10 DIAGNOSIS — E6609 Other obesity due to excess calories: Secondary | ICD-10-CM | POA: Diagnosis not present

## 2021-02-10 LAB — URINALYSIS, ROUTINE W REFLEX MICROSCOPIC
Bilirubin Urine: NEGATIVE
Glucose, UA: >=500 mg/dL — AB
Hgb urine dipstick: NEGATIVE
Ketones, ur: NEGATIVE mg/dL
Leukocytes,Ua: NEGATIVE
Nitrite: NEGATIVE
Protein, ur: NEGATIVE mg/dL
Specific Gravity, Urine: 1.023 (ref 1.005–1.030)
pH: 5 (ref 5.0–8.0)

## 2021-02-10 LAB — CBC
HCT: 39.5 % (ref 39.0–52.0)
Hemoglobin: 12.5 g/dL — ABNORMAL LOW (ref 13.0–17.0)
MCH: 28.1 pg (ref 26.0–34.0)
MCHC: 31.6 g/dL (ref 30.0–36.0)
MCV: 88.8 fL (ref 80.0–100.0)
Platelets: 255 10*3/uL (ref 150–400)
RBC: 4.45 MIL/uL (ref 4.22–5.81)
RDW: 15.9 % — ABNORMAL HIGH (ref 11.5–15.5)
WBC: 7.7 10*3/uL (ref 4.0–10.5)
nRBC: 0 % (ref 0.0–0.2)

## 2021-02-10 LAB — BASIC METABOLIC PANEL
Anion gap: 9 (ref 5–15)
BUN: 14 mg/dL (ref 6–20)
CO2: 21 mmol/L — ABNORMAL LOW (ref 22–32)
Calcium: 8.5 mg/dL — ABNORMAL LOW (ref 8.9–10.3)
Chloride: 97 mmol/L — ABNORMAL LOW (ref 98–111)
Creatinine, Ser: 0.95 mg/dL (ref 0.61–1.24)
GFR, Estimated: >60 mL/min (ref >60)
Glucose, Bld: 570 mg/dL (ref 70–99)
Potassium: 4.6 mmol/L (ref 3.5–5.1)
Sodium: 127 mmol/L — ABNORMAL LOW (ref 135–145)

## 2021-02-10 LAB — CBG MONITORING, ED
Glucose-Capillary: 546 mg/dL (ref 70–99)
Glucose-Capillary: 458 mg/dL — ABNORMAL HIGH (ref 70–99)

## 2021-02-10 MED ORDER — SODIUM CHLORIDE 0.9 % IV BOLUS
1000.0000 mL | Freq: Once | INTRAVENOUS | Status: AC
  Administered 2021-02-10: 1000 mL via INTRAVENOUS

## 2021-02-10 MED ORDER — INSULIN ASPART 100 UNIT/ML IJ SOLN
8.0000 [IU] | Freq: Once | INTRAMUSCULAR | Status: AC
  Administered 2021-02-10: 5 [IU] via SUBCUTANEOUS
  Filled 2021-02-10: qty 1

## 2021-02-10 MED ORDER — SULFAMETHOXAZOLE-TRIMETHOPRIM 800-160 MG PO TABS
1.0000 | ORAL_TABLET | Freq: Once | ORAL | Status: AC
  Administered 2021-02-10: 1 via ORAL
  Filled 2021-02-10: qty 1

## 2021-02-10 MED ORDER — SULFAMETHOXAZOLE-TRIMETHOPRIM 800-160 MG PO TABS: 1.0000 | ORAL_TABLET | Freq: Two times a day (BID) | ORAL | 0 refills | Status: AC

## 2021-02-10 NOTE — ED Notes (Signed)
EDP Zammit informed repeat CBG 458 after 1 L IVF. Verbal order adjust SubQ insulin dose. RN to give 5 units.

## 2021-02-10 NOTE — ED Triage Notes (Signed)
Referred from PCP for evaluation of elevated blood sugar, also has a lesion of back of right leg for 3 -4 weeks, states wound is draining

## 2021-02-10 NOTE — Discharge Instructions (Addendum)
Increase your Glucophage so you are taking 2 pills twice a day.  We are also starting you on an antibiotic for possible urinary tract infection and skin infection.  Follow-up with your family doctor in 2 to 3 days for recheck

## 2021-02-10 NOTE — ED Provider Notes (Signed)
[facility] EMERGENCY DEPARTMENT Provider Note   CSN: [id number] Arrival date & time: 02/10/21  1756     History Chief Complaint  Patient presents with   Hyperglycemia    Luke [name] is a 60 y.o. male.  Patient was sent over here by his doctor because his glucose was elevated.  He also has a small lesion to his right lower leg  The history is provided by the patient and medical records. No language interpreter was used.  Hyperglycemia Blood sugar level PTA:  Over 500 Severity:  Moderate Timing:  Constant Progression:  Worsening Chronicity:  Recurrent Diabetes status:  Controlled with oral medications Context: not change in medication   Relieved by:  Nothing Ineffective treatments:  None tried Associated symptoms: fatigue   Associated symptoms: no abdominal pain and no chest pain       Past Medical History:  Diagnosis Date   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/21/2012   Cervical stenosis of spine 10/20/2012   Mild per CT   Cholelithiasis 10/20/2012   Chronic inflammatory arthritis 10/21/2012   DM (diabetes mellitus) (HCC)    DM (diabetes mellitus) type 2, uncontrolled, with ketoacidosis (HCC) 10/16/2012   DKA   Gout    Question diagnosis   Pain, joint, multiple sites    Pneumonia    Protein-calorie malnutrition, severe (HCC) 10/20/2012   Right inguinal hernia 10/20/2012   Large, without obstruction   Unintentional weight loss 10/16/2012    Patient Active Problem List   Diagnosis Date Noted   Arthritis of left hip 04/19/2014   Left hip pain 01/17/2013   Chronic steroid use 01/17/2013   Septic arthritis of hip (HCC) 01/17/2013   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    Class: Diagnosis of   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/21/2012   Chronic inflammatory arthritis 10/21/2012   Protein-calorie malnutrition, severe (HCC) 10/20/2012   Cervical stenosis of spine 10/20/2012   Right inguinal hernia 10/20/2012   Cholelithiasis  10/20/2012   Neck stiffness 10/19/2012   Neck pain, bilateral 10/19/2012   Cellulitis of leg, right 10/19/2012   Sinus tachycardia 10/18/2012   Leg edema, right 10/18/2012   Unintentional weight loss 10/18/2012   Hyponatremia 10/18/2012   Cellulitis of left lower extremity 10/18/2012   DM (diabetes mellitus) type 2, uncontrolled, with ketoacidosis (HCC) 10/17/2012   Acute gout 10/17/2012   Obesity 10/17/2012   DKA (diabetic ketoacidoses) 10/15/2012   Diabetes mellitus type 2 in obese (HCC) 10/15/2012   Ulna distal fracture 12/22/2010    Past Surgical History:  Procedure Laterality Date   NO PAST SURGERIES     TOTAL HIP ARTHROPLASTY Left 04/19/2014   Procedure: LEFT TOTAL HIP ARTHROPLASTY;  Surgeon: Nestor Lewandowsky, MD;  Location: [facility] OR;  Service: Orthopedics;  Laterality: Left;       Family History  Problem Relation Age of Onset   CVA Mother    Anxiety disorder Father    Arthritis Unknown    Diabetes Brother    Diabetes Brother     Social History   Tobacco Use   Smoking status: Every Day    Packs/day: 1.00    Types: Cigarettes    Last attempt to quit: 10/14/2012    Years since quitting: 8.3   Smokeless tobacco: Never   Tobacco comments:    trying to quit  Substance Use Topics   Alcohol use: No    Alcohol/week: 0.0 standard drinks   Drug use: No    Home Medications Prior to Admission  medications   Medication Sig Start Date End Date Taking? Authorizing Provider  acetaminophen (TYLENOL) 650 MG CR tablet Take 650 mg by mouth as needed for pain.    [provider]  aspirin EC 325 MG tablet Take 1 tablet (325 mg total) by mouth 2 (two) times daily. 04/19/14   Allena Katz, PA-C  diphenhydrAMINE (BENADRYL) 25 MG tablet Take 25-50 mg by mouth daily as needed for itching or allergies.     [provider]  gabapentin (NEURONTIN) 300 MG capsule Take 300 mg by mouth 2 (two) times daily.     [provider]  lisinopril (PRINIVIL,ZESTRIL) 2.5  MG tablet Take 2.5 mg by mouth daily. 02/26/14   [provider]  metFORMIN (GLUCOPHAGE) 500 MG tablet Take 500 mg by mouth 2 (two) times daily with a meal.    [provider]  methocarbamol (ROBAXIN) 500 MG tablet Take 1 tablet (500 mg total) by mouth 2 (two) times daily with a meal. Patient not taking: Reported on 09/02/2016 04/19/14   Allena Katz, PA-C  oxyCODONE-acetaminophen (ROXICET) 5-325 MG per tablet Take 1 tablet by mouth every 4 (four) hours as needed. Patient not taking: Reported on 09/02/2016 04/19/14   Allena Katz, PA-C    Allergies    Bee venom and Shellfish allergy  Review of Systems   Review of Systems  Constitutional:  Positive for fatigue. Negative for appetite change.  HENT:  Negative for congestion, ear discharge and sinus pressure.   Eyes:  Negative for discharge.  Respiratory:  Negative for cough.   Cardiovascular:  Negative for chest pain.  Gastrointestinal:  Negative for abdominal pain and diarrhea.  Genitourinary:  Negative for frequency and hematuria.  Musculoskeletal:  Negative for back pain.  Skin:  Negative for rash.  Neurological:  Negative for seizures and headaches.  Psychiatric/Behavioral:  Negative for hallucinations.    Physical Exam Updated Vital Signs BP (!) 125/52   Pulse 89   Temp 98.1 F (36.7 C) (Oral)   Resp 17   Ht 5\' 4"  (1.626 m)   Wt 130.1 kg   SpO2 92%   BMI 49.23 kg/m   Physical Exam Vitals and nursing note reviewed.  Constitutional:      Appearance: He is well-developed.  HENT:     Head: Normocephalic.     Nose: Nose normal.  Eyes:     General: No scleral icterus.    Conjunctiva/sclera: Conjunctivae normal.  Neck:     Thyroid: No thyromegaly.  Cardiovascular:     Rate and Rhythm: Normal rate and regular rhythm.     Heart sounds: No murmur heard.   No friction rub. No gallop.  Pulmonary:     Breath sounds: No stridor. No wheezing or rales.  Chest:     Chest wall: No tenderness.   Abdominal:     General: There is no distension.     Tenderness: There is no abdominal tenderness. There is no rebound.  Musculoskeletal:        General: Normal range of motion.     Cervical back: Neck supple.     Comments: Patient has an ulcer to his calf.  He has 2 small ulcers mild tenderness that are draining.  Probable skin infection  Lymphadenopathy:     Cervical: No cervical adenopathy.  Skin:    Findings: No erythema or rash.  Neurological:     Mental Status: He is alert and oriented to person, place, and time.  Motor: No abnormal muscle tone.     Coordination: Coordination normal.  Psychiatric:        Behavior: Behavior normal.    ED Results / Procedures / Treatments   Labs (all labs ordered are listed, but only abnormal results are displayed) Labs Reviewed  BASIC METABOLIC PANEL - Abnormal; Notable for the following components:      Result Value   Sodium 127 (*)    Chloride 97 (*)    CO2 21 (*)    Glucose, Bld 570 (*)    Calcium 8.5 (*)    All other components within normal limits  CBC - Abnormal; Notable for the following components:   Hemoglobin 12.5 (*)    RDW 15.9 (*)    All other components within normal limits  URINALYSIS, ROUTINE W REFLEX MICROSCOPIC - Abnormal; Notable for the following components:   Color, Urine STRAW (*)    Glucose, UA >=500 (*)    Bacteria, UA RARE (*)    All other components within normal limits  CBG MONITORING, ED - Abnormal; Notable for the following components:   Glucose-Capillary 546 (*)    All other components within normal limits  AEROBIC CULTURE W GRAM STAIN (SUPERFICIAL SPECIMEN)  URINE CULTURE  CBG MONITORING, ED  CBG MONITORING, ED  CBG MONITORING, ED    EKG None  Radiology No results found.  Procedures Procedures   Medications Ordered in ED Medications  insulin aspart (novoLOG) injection 8 Units (has no administration in time range)  sodium chloride 0.9 % bolus 1,000 mL (1,000 mLs Intravenous New  Bag/Given 02/10/21 2152)  sulfamethoxazole-trimethoprim (BACTRIM DS) 800-160 MG per tablet 1 tablet (1 tablet Oral Given 02/10/21 2225)    ED Course  I have reviewed the triage vital signs and the nursing notes.  Pertinent labs & imaging results that were available during my care of the patient were reviewed by me and considered in my medical decision making (see chart for details).    MDM Rules/Calculators/A&P                          Patient with poorly controlled diabetes and possible urinary tract infection and skin infection.  He will be discharged with Bactrim and we will increase his Glucophage so he is taking 1000 mg twice a day  Final Clinical Impression(s) / ED Diagnoses Final diagnoses:  None    Rx / DC Orders ED Discharge Orders     None        Bethann Berkshire, MD 02/17/21 1722

## 2021-02-10 NOTE — ED Notes (Signed)
Discharge orders placed. CBG re-check range 100-400, pt able to be discharged

## 2021-02-10 NOTE — ED Notes (Signed)
EDP Zammit requests CBG recheck post 1 hr SubQ insulin.

## 2021-02-11 DIAGNOSIS — E1165 Type 2 diabetes mellitus with hyperglycemia: Secondary | ICD-10-CM | POA: Diagnosis not present

## 2021-02-11 LAB — CBG MONITORING, ED
Glucose-Capillary: 373 mg/dL — ABNORMAL HIGH (ref 70–99)
Glucose-Capillary: 418 mg/dL — ABNORMAL HIGH (ref 70–99)

## 2021-02-11 MED ORDER — INSULIN ASPART 100 UNIT/ML IV SOLN: 10.0000 [IU] | Freq: Once | INTRAVENOUS | Status: DC

## 2021-02-11 NOTE — ED Notes (Signed)
Wound culture collected from right lower leg. Noted red tinged drainage without foul smell. Wound re-rapped with clean nonadherent pad.

## 2021-02-11 NOTE — ED Notes (Signed)
Discharge instructions including CBG monitoring, prescription, and follow up care discussed with pt. Pt verbalized understanding with no questions at this time.

## 2021-02-11 NOTE — ED Notes (Signed)
Pt cleared for discharge by Dr Preston Fleeting CBG 373. DC IV insulin

## 2021-02-13 LAB — URINE CULTURE: Culture: >=100000 — AB

## 2021-02-14 LAB — AEROBIC CULTURE W GRAM STAIN (SUPERFICIAL SPECIMEN)

## 2021-02-16 DIAGNOSIS — E1165 Type 2 diabetes mellitus with hyperglycemia: Secondary | ICD-10-CM | POA: Diagnosis not present

## 2021-02-16 DIAGNOSIS — S81801D Unspecified open wound, right lower leg, subsequent encounter: Secondary | ICD-10-CM | POA: Diagnosis not present

## 2021-02-16 DIAGNOSIS — E1169 Type 2 diabetes mellitus with other specified complication: Secondary | ICD-10-CM | POA: Diagnosis not present

## 2021-02-16 DIAGNOSIS — I10 Essential (primary) hypertension: Secondary | ICD-10-CM | POA: Diagnosis not present

## 2021-02-23 DIAGNOSIS — S81801D Unspecified open wound, right lower leg, subsequent encounter: Secondary | ICD-10-CM | POA: Diagnosis not present

## 2021-02-23 DIAGNOSIS — E1165 Type 2 diabetes mellitus with hyperglycemia: Secondary | ICD-10-CM | POA: Diagnosis not present

## 2021-03-16 DIAGNOSIS — E1169 Type 2 diabetes mellitus with other specified complication: Secondary | ICD-10-CM | POA: Diagnosis not present

## 2021-04-13 DIAGNOSIS — M67431 Ganglion, right wrist: Secondary | ICD-10-CM | POA: Diagnosis not present

## 2021-04-13 DIAGNOSIS — E1169 Type 2 diabetes mellitus with other specified complication: Secondary | ICD-10-CM | POA: Diagnosis not present

## 2021-07-10 ENCOUNTER — Encounter: Payer: Self-pay | Admitting: Radiology

## 2021-10-12 ENCOUNTER — Encounter: Payer: Self-pay | Admitting: Orthopedic Surgery

## 2021-10-12 ENCOUNTER — Ambulatory Visit (INDEPENDENT_AMBULATORY_CARE_PROVIDER_SITE_OTHER): Payer: 59

## 2021-10-12 ENCOUNTER — Ambulatory Visit (INDEPENDENT_AMBULATORY_CARE_PROVIDER_SITE_OTHER): Payer: 59 | Admitting: Orthopedic Surgery

## 2021-10-12 VITALS — BP 160/78 | HR 113 | Ht 64.0 in | Wt 279.0 lb

## 2021-10-12 DIAGNOSIS — M67431 Ganglion, right wrist: Secondary | ICD-10-CM | POA: Diagnosis not present

## 2021-10-12 DIAGNOSIS — M6740 Ganglion, unspecified site: Secondary | ICD-10-CM

## 2021-10-12 DIAGNOSIS — M25331 Other instability, right wrist: Secondary | ICD-10-CM

## 2021-10-12 NOTE — Progress Notes (Signed)
Chief Complaint  Patient presents with   Hand Problem    Dorsal right wrist ganglion several years / getting larger     HPI: 61 year old male history of diabetes has a mass on his right wrist for the last 8 years  He says it actually was bigger than it is now it has gotten smaller  He says it does not cause him any pain at this point is just a nuisance having on the back of his wrist and his primary care doctor also recommended that he have it evaluated  Past Medical History:  Diagnosis Date   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/21/2012   Cervical stenosis of spine 10/20/2012   Mild per CT   Cholelithiasis 10/20/2012   Chronic inflammatory arthritis 10/21/2012   DM (diabetes mellitus) (HCC)    DM (diabetes mellitus) type 2, uncontrolled, with ketoacidosis (HCC) 10/16/2012   DKA   Gout    Question diagnosis   Pain, joint, multiple sites    Pneumonia    Protein-calorie malnutrition, severe (HCC) 10/20/2012   Right inguinal hernia 10/20/2012   Large, without obstruction   Unintentional weight loss 10/16/2012    BP (!) 160/78   Pulse (!) 113   Ht 5\' 4"  (1.626 m)   Wt 279 lb (126.6 kg)   BMI 47.89 kg/m   The patient meets the AMA guidelines for Morbid (severe) obesity with a BMI > 40.0 and I have recommended weight loss.   General appearance: Well-developed well-nourished no gross deformities  Cardiovascular normal pulse and perfusion normal color without edema  Neurologically no sensation loss or deficits or pathologic reflexes  Psychological: Awake alert and oriented x3 mood and affect normal  Skin no lacerations or ulcerations no nodularity no palpable masses, no erythema or nodularity  Musculoskeletal: RIGHT WRIST   DORSAL GANGLION 3 X 3 CYST   NROM WRIST   NON TENDER   Imaging scapho lunate widening  Ganglion cyst   A/P Encounter Diagnoses  Name Primary?   Ganglion cyst    Scapholunate dissociation, right Yes   Based on the amount of  scapholunate widening I see in this wrist although he is asymptomatic in terms of the ganglion I recommend he see a hand specialist for evaluation and management.

## 2021-10-12 NOTE — Patient Instructions (Signed)
Driving Directions to [employer] from [employer] address is [address] [place] The phone number is [phone]  Dr Marlyne Beards

## 2021-10-15 ENCOUNTER — Ambulatory Visit (INDEPENDENT_AMBULATORY_CARE_PROVIDER_SITE_OTHER): Payer: 59 | Admitting: Orthopedic Surgery

## 2021-10-15 DIAGNOSIS — M67431 Ganglion, right wrist: Secondary | ICD-10-CM

## 2021-10-15 DIAGNOSIS — M19039 Primary osteoarthritis, unspecified wrist: Secondary | ICD-10-CM

## 2021-10-15 DIAGNOSIS — M19031 Primary osteoarthritis, right wrist: Secondary | ICD-10-CM | POA: Diagnosis not present

## 2021-10-15 DIAGNOSIS — M67439 Ganglion, unspecified wrist: Secondary | ICD-10-CM

## 2021-10-15 NOTE — Progress Notes (Signed)
Office Visit Note   Patient: Luke [name]           Date of Birth: 1960-12-29           MRN: [id number] Visit Date: 10/15/2021              Requested by: Vickki Hearing, MD [id number] [place],  [place] [address] PCP: Mirna Mires, MD   Assessment & Plan: Visit Diagnoses:  1. Dorsal wrist ganglion   2. Wrist arthritis     Plan: Patient is a right dorsal carpal ganglion and radiographic evidence of wrist arthritis particular at the radiolunate articulation.  He has no pain with range of motion of the wrist or with use of the wrist.  We discussed the nature of ganglion cyst as well as her diagnosis, prognosis, both surgical and conservative treatment options.  After our discussion, he would like to proceed with attempted cyst aspiration.  Regarding his wrist arthritis, this is completely asymptomatic for him.  We will continue to monitor this for now.  He can follow-up again with me as needed.  Follow-Up Instructions: No follow-ups on file.   Orders:  No orders of the defined types were placed in this encounter.  No orders of the defined types were placed in this encounter.     Procedures: Hand/UE Inj: R wrist for dorsal carpal ganglion on 10/15/2021 5:12 PM Indications: therapeutic Details: 18 G needle, dorsal approach Aspirate: (amount: 3) clear Outcome: tolerated well, no immediate complications Procedure, treatment alternatives, risks and benefits explained, specific risks discussed. Consent was given by the patient. Immediately prior to procedure a time out was called to verify the correct patient, procedure, equipment, support staff and site/side marked as required. Patient was prepped and draped in the usual sterile fashion.      Clinical Data: No additional findings.   Subjective: Chief Complaint  Patient presents with  . Right Wrist - Injury    This is a 61 year old right-hand-dominant male who presents with a dorsal carpal ganglion cyst.  This has  been present since around 2015.  It will fluctuate in size getting bigger and smaller.  It is not painful.  He has no pain with range of motion of the wrist.  He has no complaints.  He does not like the cosmetic appearance of the mass but it is not limiting his function in any way.  Injury   Review of Systems   Objective: Vital Signs: There were no vitals taken for this visit.  Physical Exam  Right Hand Exam   Tenderness  The patient is experiencing no tenderness.   Other  Erythema: absent Sensation: normal Pulse: present  Comments:  Approx 2.5 x 2.5 cm cystic mass at dorsal radial wrist.  Mass is firm, round, well circumscribed, mobile, and transilluminates.  Wrist ROM 60/45 w/ no pain.      Specialty Comments:  No specialty comments available.  Imaging: No results found.   PMFS History: Patient Active Problem List   Diagnosis Date Noted  . Dorsal wrist ganglion 10/15/2021  . Wrist arthritis 10/15/2021  . Arthritis of left hip 04/19/2014  . Left hip pain 01/17/2013  . Chronic steroid use 01/17/2013  . Septic arthritis of hip (HCC) 01/17/2013  . 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 Chronic inflammatory arthritis 10/21/2012  . Protein-calorie malnutrition, severe (HCC) 10/20/2012  . Cervical stenosis of spine 10/20/2012  . Right  inguinal hernia 10/20/2012  . Cholelithiasis 10/20/2012  . Neck stiffness 10/19/2012  . Neck pain, bilateral 10/19/2012  . Cellulitis of leg, right 10/19/2012  . Sinus tachycardia 10/18/2012  . Leg edema, right 10/18/2012  . Unintentional weight loss 10/18/2012  . Hyponatremia 10/18/2012  . Cellulitis of left lower extremity 10/18/2012  . DM (diabetes mellitus) type 2, uncontrolled, with ketoacidosis (HCC) 10/17/2012  . Acute gout 10/17/2012  . Obesity 10/17/2012  . DKA (diabetic ketoacidoses) 10/15/2012  . Diabetes mellitus type 2 in obese (HCC)  10/15/2012  . Ulna distal fracture 12/22/2010   Past Medical History:  Diagnosis Date  . 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 Cervical stenosis of spine 10/20/2012   Mild per CT  . Cholelithiasis 10/20/2012  . Chronic inflammatory arthritis 10/21/2012  . DM (diabetes mellitus) (HCC)   . DM (diabetes mellitus) type 2, uncontrolled, with ketoacidosis (HCC) 10/16/2012   DKA  . Gout    Question diagnosis  . Pain, joint, multiple sites   . Pneumonia   . Protein-calorie malnutrition, severe (HCC) 10/20/2012  . Right inguinal hernia 10/20/2012   Large, without obstruction  . Unintentional weight loss 10/16/2012    Family History  Problem Relation Age of Onset  . CVA Mother   . Anxiety disorder Father   . Arthritis Unknown   . Diabetes Brother   . Diabetes Brother     Past Surgical History:  Procedure Laterality Date  . NO PAST SURGERIES    . 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 Occupation: textile work    Associate Professor: [employer]   Tobacco Use  . Smoking status: Every Day    Packs/day: 1.00    Types: Cigarettes    Last attempt to quit: 10/14/2012    Years since quitting: 9.0  . Smokeless tobacco: Never  . Tobacco comments:    trying to quit  Substance and Sexual Activity  . Alcohol use: No    Alcohol/week: 0.0 standard drinks of alcohol  . Drug use: No  . Sexual activity: Not on file

## 2021-12-15 DIAGNOSIS — M67431 Ganglion, right wrist: Secondary | ICD-10-CM | POA: Diagnosis not present

## 2021-12-15 DIAGNOSIS — E1169 Type 2 diabetes mellitus with other specified complication: Secondary | ICD-10-CM | POA: Diagnosis not present

## 2021-12-15 DIAGNOSIS — I10 Essential (primary) hypertension: Secondary | ICD-10-CM | POA: Diagnosis not present

## 2021-12-15 DIAGNOSIS — B353 Tinea pedis: Secondary | ICD-10-CM | POA: Diagnosis not present

## 2022-03-16 DIAGNOSIS — E1169 Type 2 diabetes mellitus with other specified complication: Secondary | ICD-10-CM | POA: Diagnosis not present

## 2022-03-16 DIAGNOSIS — K4090 Unilateral inguinal hernia, without obstruction or gangrene, not specified as recurrent: Secondary | ICD-10-CM | POA: Diagnosis not present

## 2022-03-16 DIAGNOSIS — E785 Hyperlipidemia, unspecified: Secondary | ICD-10-CM | POA: Diagnosis not present

## 2022-03-16 DIAGNOSIS — B353 Tinea pedis: Secondary | ICD-10-CM | POA: Diagnosis not present

## 2022-03-16 DIAGNOSIS — I10 Essential (primary) hypertension: Secondary | ICD-10-CM | POA: Diagnosis not present

## 2022-06-15 DIAGNOSIS — K4090 Unilateral inguinal hernia, without obstruction or gangrene, not specified as recurrent: Secondary | ICD-10-CM | POA: Diagnosis not present

## 2022-06-15 DIAGNOSIS — E1169 Type 2 diabetes mellitus with other specified complication: Secondary | ICD-10-CM | POA: Diagnosis not present

## 2022-06-15 DIAGNOSIS — I10 Essential (primary) hypertension: Secondary | ICD-10-CM | POA: Diagnosis not present

## 2022-07-01 ENCOUNTER — Encounter: Payer: Self-pay | Admitting: Radiology

## 2022-07-28 DIAGNOSIS — E1169 Type 2 diabetes mellitus with other specified complication: Secondary | ICD-10-CM | POA: Diagnosis not present

## 2022-09-14 DIAGNOSIS — I10 Essential (primary) hypertension: Secondary | ICD-10-CM | POA: Diagnosis not present

## 2022-09-14 DIAGNOSIS — B353 Tinea pedis: Secondary | ICD-10-CM | POA: Diagnosis not present

## 2022-09-14 DIAGNOSIS — E1169 Type 2 diabetes mellitus with other specified complication: Secondary | ICD-10-CM | POA: Diagnosis not present

## 2022-12-14 DIAGNOSIS — E661 Drug-induced obesity: Secondary | ICD-10-CM | POA: Diagnosis not present

## 2022-12-14 DIAGNOSIS — E781 Pure hyperglyceridemia: Secondary | ICD-10-CM | POA: Diagnosis not present

## 2022-12-14 DIAGNOSIS — I10 Essential (primary) hypertension: Secondary | ICD-10-CM | POA: Diagnosis not present

## 2022-12-14 DIAGNOSIS — E1169 Type 2 diabetes mellitus with other specified complication: Secondary | ICD-10-CM | POA: Diagnosis not present

## 2022-12-14 DIAGNOSIS — Z125 Encounter for screening for malignant neoplasm of prostate: Secondary | ICD-10-CM | POA: Diagnosis not present

## 2022-12-14 DIAGNOSIS — B353 Tinea pedis: Secondary | ICD-10-CM | POA: Diagnosis not present

## 2023-03-22 DIAGNOSIS — E1169 Type 2 diabetes mellitus with other specified complication: Secondary | ICD-10-CM | POA: Diagnosis not present

## 2023-03-22 DIAGNOSIS — Z6841 Body Mass Index (BMI) 40.0 and over, adult: Secondary | ICD-10-CM | POA: Diagnosis not present

## 2023-03-22 DIAGNOSIS — Z125 Encounter for screening for malignant neoplasm of prostate: Secondary | ICD-10-CM | POA: Diagnosis not present

## 2023-04-12 DIAGNOSIS — H5213 Myopia, bilateral: Secondary | ICD-10-CM | POA: Diagnosis not present

## 2023-04-12 DIAGNOSIS — H52209 Unspecified astigmatism, unspecified eye: Secondary | ICD-10-CM | POA: Diagnosis not present

## 2023-04-12 DIAGNOSIS — H524 Presbyopia: Secondary | ICD-10-CM | POA: Diagnosis not present

## 2023-06-20 DIAGNOSIS — M67431 Ganglion, right wrist: Secondary | ICD-10-CM | POA: Diagnosis not present

## 2023-06-20 DIAGNOSIS — E1169 Type 2 diabetes mellitus with other specified complication: Secondary | ICD-10-CM | POA: Diagnosis not present

## 2023-06-20 DIAGNOSIS — R051 Acute cough: Secondary | ICD-10-CM | POA: Diagnosis not present

## 2023-06-20 DIAGNOSIS — K4090 Unilateral inguinal hernia, without obstruction or gangrene, not specified as recurrent: Secondary | ICD-10-CM | POA: Diagnosis not present

## 2023-06-20 DIAGNOSIS — E781 Pure hyperglyceridemia: Secondary | ICD-10-CM | POA: Diagnosis not present

## 2023-06-20 DIAGNOSIS — I10 Essential (primary) hypertension: Secondary | ICD-10-CM | POA: Diagnosis not present

## 2023-06-20 DIAGNOSIS — B353 Tinea pedis: Secondary | ICD-10-CM | POA: Diagnosis not present

## 2023-09-12 ENCOUNTER — Other Ambulatory Visit (HOSPITAL_COMMUNITY): Payer: Self-pay | Admitting: Family Medicine

## 2023-09-12 DIAGNOSIS — E7800 Pure hypercholesterolemia, unspecified: Secondary | ICD-10-CM | POA: Diagnosis not present

## 2023-09-12 DIAGNOSIS — Z87891 Personal history of nicotine dependence: Secondary | ICD-10-CM

## 2023-09-12 DIAGNOSIS — K4090 Unilateral inguinal hernia, without obstruction or gangrene, not specified as recurrent: Secondary | ICD-10-CM | POA: Diagnosis not present

## 2023-09-12 DIAGNOSIS — E1169 Type 2 diabetes mellitus with other specified complication: Secondary | ICD-10-CM | POA: Diagnosis not present

## 2023-09-12 DIAGNOSIS — I10 Essential (primary) hypertension: Secondary | ICD-10-CM | POA: Diagnosis not present

## 2023-09-20 DIAGNOSIS — H2513 Age-related nuclear cataract, bilateral: Secondary | ICD-10-CM | POA: Diagnosis not present

## 2023-09-20 DIAGNOSIS — H40053 Ocular hypertension, bilateral: Secondary | ICD-10-CM | POA: Diagnosis not present

## 2023-09-30 ENCOUNTER — Ambulatory Visit (HOSPITAL_COMMUNITY)
Admission: RE | Admit: 2023-09-30 | Discharge: 2023-09-30 | Disposition: A | Discharge status: Home / Self Care | Source: Ambulatory Visit | Attending: Family Medicine | Admitting: Family Medicine

## 2023-09-30 DIAGNOSIS — Z87891 Personal history of nicotine dependence: Secondary | ICD-10-CM | POA: Diagnosis not present

## 2023-09-30 DIAGNOSIS — F17210 Nicotine dependence, cigarettes, uncomplicated: Secondary | ICD-10-CM | POA: Diagnosis not present

## 2023-12-13 DIAGNOSIS — M67431 Ganglion, right wrist: Secondary | ICD-10-CM | POA: Diagnosis not present

## 2023-12-13 DIAGNOSIS — K4090 Unilateral inguinal hernia, without obstruction or gangrene, not specified as recurrent: Secondary | ICD-10-CM | POA: Diagnosis not present

## 2023-12-13 DIAGNOSIS — E7800 Pure hypercholesterolemia, unspecified: Secondary | ICD-10-CM | POA: Diagnosis not present

## 2023-12-13 DIAGNOSIS — M1711 Unilateral primary osteoarthritis, right knee: Secondary | ICD-10-CM | POA: Diagnosis not present

## 2023-12-13 DIAGNOSIS — I10 Essential (primary) hypertension: Secondary | ICD-10-CM | POA: Diagnosis not present

## 2023-12-13 DIAGNOSIS — B353 Tinea pedis: Secondary | ICD-10-CM | POA: Diagnosis not present

## 2023-12-13 DIAGNOSIS — E1169 Type 2 diabetes mellitus with other specified complication: Secondary | ICD-10-CM | POA: Diagnosis not present
